# Patient Record
Sex: Female | Born: 1985 | Race: White | Hispanic: No | Marital: Married | Smoking: Former smoker
Health system: Southern US, Community
[De-identification: ages and names within clinical notes are randomized; demographics above are authoritative.]

## PROBLEM LIST (undated history)

## (undated) ENCOUNTER — Inpatient Hospital Stay: Payer: Self-pay

## (undated) ENCOUNTER — Emergency Department: Admission: EM | Discharge status: Absent without leave | Payer: No Typology Code available for payment source

## (undated) DIAGNOSIS — M5430 Sciatica, unspecified side: Secondary | ICD-10-CM

## (undated) DIAGNOSIS — F1910 Other psychoactive substance abuse, uncomplicated: Secondary | ICD-10-CM

## (undated) DIAGNOSIS — B182 Chronic viral hepatitis C: Secondary | ICD-10-CM

## (undated) DIAGNOSIS — I5021 Acute systolic (congestive) heart failure: Secondary | ICD-10-CM

## (undated) DIAGNOSIS — I428 Other cardiomyopathies: Secondary | ICD-10-CM

## (undated) DIAGNOSIS — J45909 Unspecified asthma, uncomplicated: Secondary | ICD-10-CM

## (undated) DIAGNOSIS — G43909 Migraine, unspecified, not intractable, without status migrainosus: Secondary | ICD-10-CM

## (undated) HISTORY — DX: Migraine, unspecified, not intractable, without status migrainosus: G43.909

## (undated) HISTORY — DX: Unspecified asthma, uncomplicated: J45.909

## (undated) HISTORY — PX: MANDIBLE FRACTURE SURGERY: SHX706

---

## 2008-04-26 ENCOUNTER — Emergency Department: Payer: Self-pay | Admitting: Emergency Medicine

## 2008-08-12 ENCOUNTER — Emergency Department: Payer: Self-pay | Admitting: Emergency Medicine

## 2008-08-13 ENCOUNTER — Ambulatory Visit: Payer: Self-pay | Admitting: Emergency Medicine

## 2008-12-08 ENCOUNTER — Inpatient Hospital Stay: Payer: Self-pay | Admitting: Obstetrics & Gynecology

## 2008-12-12 ENCOUNTER — Emergency Department: Payer: Self-pay | Admitting: Emergency Medicine

## 2010-12-11 ENCOUNTER — Emergency Department: Payer: Self-pay | Admitting: Internal Medicine

## 2011-07-06 ENCOUNTER — Emergency Department: Payer: Self-pay | Admitting: Emergency Medicine

## 2012-01-24 ENCOUNTER — Emergency Department: Payer: Self-pay | Admitting: *Deleted

## 2012-04-23 ENCOUNTER — Emergency Department: Payer: Self-pay | Admitting: Emergency Medicine

## 2012-06-04 ENCOUNTER — Emergency Department: Payer: Self-pay | Admitting: Emergency Medicine

## 2012-07-18 ENCOUNTER — Emergency Department: Payer: Self-pay | Admitting: Emergency Medicine

## 2012-08-03 ENCOUNTER — Emergency Department: Payer: Self-pay | Admitting: Unknown Physician Specialty

## 2012-10-25 ENCOUNTER — Emergency Department: Payer: Self-pay | Admitting: Emergency Medicine

## 2012-10-25 LAB — URINALYSIS, COMPLETE
Bilirubin,UR: NEGATIVE
Glucose,UR: NEGATIVE mg/dL (ref 0–75)
Protein: NEGATIVE
RBC,UR: 35 /HPF (ref 0–5)
Specific Gravity: 1.026 (ref 1.003–1.030)
Squamous Epithelial: 6
WBC UR: 66 /HPF (ref 0–5)

## 2012-10-25 LAB — COMPREHENSIVE METABOLIC PANEL
Albumin: 4.3 g/dL (ref 3.4–5.0)
Alkaline Phosphatase: 65 U/L (ref 50–136)
Anion Gap: 6 — ABNORMAL LOW (ref 7–16)
Bilirubin,Total: 0.3 mg/dL (ref 0.2–1.0)
Calcium, Total: 8.9 mg/dL (ref 8.5–10.1)
Chloride: 107 mmol/L (ref 98–107)
Creatinine: 0.62 mg/dL (ref 0.60–1.30)
EGFR (Non-African Amer.): >60
SGPT (ALT): 12 U/L (ref 12–78)
Sodium: 139 mmol/L (ref 136–145)

## 2012-10-25 LAB — CBC
HCT: 40.7 % (ref 35.0–47.0)
MCH: 28 pg (ref 26.0–34.0)
MCHC: 32.4 g/dL (ref 32.0–36.0)
MCV: 86 fL (ref 80–100)
Platelet: 187 10*3/uL (ref 150–440)
RBC: 4.72 10*6/uL (ref 3.80–5.20)
RDW: 13.2 % (ref 11.5–14.5)
WBC: 9.7 10*3/uL (ref 3.6–11.0)

## 2012-10-25 LAB — PREGNANCY, URINE: Pregnancy Test, Urine: NEGATIVE m[IU]/mL

## 2012-11-25 ENCOUNTER — Emergency Department: Payer: Self-pay | Admitting: Emergency Medicine

## 2012-11-25 LAB — URINALYSIS, COMPLETE
Bacteria: NONE SEEN
Glucose,UR: NEGATIVE mg/dL (ref 0–75)
RBC,UR: 1 /HPF (ref 0–5)
Specific Gravity: 1.009 (ref 1.003–1.030)
Squamous Epithelial: 8

## 2012-11-25 LAB — CBC
HCT: 42.1 % (ref 35.0–47.0)
MCH: 28 pg (ref 26.0–34.0)
MCV: 85 fL (ref 80–100)
WBC: 5.5 10*3/uL (ref 3.6–11.0)

## 2012-11-25 LAB — COMPREHENSIVE METABOLIC PANEL
Albumin: 4.4 g/dL (ref 3.4–5.0)
Alkaline Phosphatase: 47 U/L — ABNORMAL LOW (ref 50–136)
BUN: 6 mg/dL — ABNORMAL LOW (ref 7–18)
Chloride: 107 mmol/L (ref 98–107)
Co2: 28 mmol/L (ref 21–32)
Creatinine: 0.53 mg/dL — ABNORMAL LOW (ref 0.60–1.30)
Osmolality: 277 (ref 275–301)
SGPT (ALT): 17 U/L (ref 12–78)
Total Protein: 8.4 g/dL — ABNORMAL HIGH (ref 6.4–8.2)

## 2012-11-25 LAB — LIPASE, BLOOD: Lipase: 119 U/L (ref 73–393)

## 2013-03-10 ENCOUNTER — Emergency Department: Payer: Self-pay | Admitting: Internal Medicine

## 2013-03-10 LAB — COMPREHENSIVE METABOLIC PANEL
Alkaline Phosphatase: 46 U/L — ABNORMAL LOW (ref 50–136)
Anion Gap: 5 — ABNORMAL LOW (ref 7–16)
BUN: 6 mg/dL — ABNORMAL LOW (ref 7–18)
Bilirubin,Total: 0.3 mg/dL (ref 0.2–1.0)
Calcium, Total: 8.7 mg/dL (ref 8.5–10.1)
Chloride: 106 mmol/L (ref 98–107)
Co2: 28 mmol/L (ref 21–32)
Creatinine: 0.65 mg/dL (ref 0.60–1.30)
EGFR (African American): >60
Glucose: 88 mg/dL (ref 65–99)
Osmolality: 275 (ref 275–301)
SGOT(AST): 16 U/L (ref 15–37)
Sodium: 139 mmol/L (ref 136–145)
Total Protein: 7.9 g/dL (ref 6.4–8.2)

## 2013-03-10 LAB — CBC
HCT: 38.8 % (ref 35.0–47.0)
HGB: 12.9 g/dL (ref 12.0–16.0)
MCH: 28.1 pg (ref 26.0–34.0)
MCHC: 33.3 g/dL (ref 32.0–36.0)
RBC: 4.6 10*6/uL (ref 3.80–5.20)
RDW: 13.3 % (ref 11.5–14.5)
WBC: 6.1 10*3/uL (ref 3.6–11.0)

## 2013-03-10 LAB — URINALYSIS, COMPLETE
Bilirubin,UR: NEGATIVE
Blood: NEGATIVE
Ketone: NEGATIVE
Ph: 7 (ref 4.5–8.0)
Protein: NEGATIVE
Squamous Epithelial: 2

## 2013-03-10 LAB — LIPASE, BLOOD: Lipase: 105 U/L (ref 73–393)

## 2013-04-15 ENCOUNTER — Emergency Department: Payer: Self-pay | Admitting: Emergency Medicine

## 2013-04-16 ENCOUNTER — Emergency Department: Payer: Self-pay | Admitting: Emergency Medicine

## 2013-04-16 LAB — CBC
HCT: 40.3 % (ref 35.0–47.0)
HGB: 13.5 g/dL (ref 12.0–16.0)
MCH: 28.3 pg (ref 26.0–34.0)
Platelet: 177 10*3/uL (ref 150–440)
RDW: 13.3 % (ref 11.5–14.5)
WBC: 12.8 10*3/uL — ABNORMAL HIGH (ref 3.6–11.0)

## 2013-04-16 LAB — URINALYSIS, COMPLETE
Nitrite: POSITIVE
Ph: 5 (ref 4.5–8.0)
Protein: 100
RBC,UR: 26 /HPF (ref 0–5)
WBC UR: 532 /HPF (ref 0–5)

## 2013-04-16 LAB — BASIC METABOLIC PANEL
Anion Gap: 8 (ref 7–16)
Calcium, Total: 9.3 mg/dL (ref 8.5–10.1)
Chloride: 99 mmol/L (ref 98–107)
Co2: 27 mmol/L (ref 21–32)
Potassium: 3.5 mmol/L (ref 3.5–5.1)
Sodium: 134 mmol/L — ABNORMAL LOW (ref 136–145)

## 2013-04-19 ENCOUNTER — Emergency Department: Payer: Self-pay | Admitting: Emergency Medicine

## 2014-05-25 ENCOUNTER — Emergency Department: Payer: Self-pay | Admitting: Emergency Medicine

## 2014-05-25 LAB — CBC WITH DIFFERENTIAL/PLATELET
BASOS PCT: 0.7 %
Basophil #: 0.1 10*3/uL (ref 0.0–0.1)
EOS PCT: 0.6 %
Eosinophil #: 0.1 10*3/uL (ref 0.0–0.7)
HCT: 40.1 % (ref 35.0–47.0)
HGB: 13 g/dL (ref 12.0–16.0)
Lymphocyte #: 2.6 10*3/uL (ref 1.0–3.6)
Lymphocyte %: 28 %
MCH: 28.2 pg (ref 26.0–34.0)
MCHC: 32.5 g/dL (ref 32.0–36.0)
MCV: 87 fL (ref 80–100)
MONO ABS: 0.6 x10 3/mm (ref 0.2–0.9)
Monocyte %: 6.8 %
NEUTROS ABS: 6 10*3/uL (ref 1.4–6.5)
Neutrophil %: 63.9 %
Platelet: 151 10*3/uL (ref 150–440)
RBC: 4.62 10*6/uL (ref 3.80–5.20)
RDW: 13.3 % (ref 11.5–14.5)
WBC: 9.4 10*3/uL (ref 3.6–11.0)

## 2014-05-25 LAB — COMPREHENSIVE METABOLIC PANEL
Albumin: 4 g/dL (ref 3.4–5.0)
Alkaline Phosphatase: 35 U/L — ABNORMAL LOW
Anion Gap: 12 (ref 7–16)
BUN: 7 mg/dL (ref 7–18)
Bilirubin,Total: 0.5 mg/dL (ref 0.2–1.0)
CHLORIDE: 104 mmol/L (ref 98–107)
CREATININE: 0.67 mg/dL (ref 0.60–1.30)
Calcium, Total: 8.5 mg/dL (ref 8.5–10.1)
Co2: 24 mmol/L (ref 21–32)
EGFR (African American): >60
EGFR (Non-African Amer.): >60
GLUCOSE: 90 mg/dL (ref 65–99)
Osmolality: 277 (ref 275–301)
Potassium: 3.5 mmol/L (ref 3.5–5.1)
SGOT(AST): 23 U/L (ref 15–37)
SGPT (ALT): 14 U/L
SODIUM: 140 mmol/L (ref 136–145)
Total Protein: 7.5 g/dL (ref 6.4–8.2)

## 2014-05-25 LAB — URINALYSIS, COMPLETE
Bilirubin,UR: NEGATIVE
Blood: NEGATIVE
Glucose,UR: NEGATIVE mg/dL (ref 0–75)
Ketone: NEGATIVE
NITRITE: POSITIVE
PH: 6 (ref 4.5–8.0)
PROTEIN: NEGATIVE
Specific Gravity: 1.018 (ref 1.003–1.030)
Squamous Epithelial: 8
WBC UR: 32 /HPF (ref 0–5)

## 2014-05-27 LAB — URINE CULTURE

## 2014-08-23 ENCOUNTER — Emergency Department: Payer: Self-pay | Admitting: Emergency Medicine

## 2015-01-04 ENCOUNTER — Emergency Department: Payer: Self-pay | Admitting: Emergency Medicine

## 2015-09-07 ENCOUNTER — Encounter: Payer: Self-pay | Admitting: *Deleted

## 2015-09-07 ENCOUNTER — Emergency Department: Payer: Medicaid Other

## 2015-09-07 ENCOUNTER — Emergency Department
Admission: EM | Admit: 2015-09-07 | Discharge: 2015-09-07 | Disposition: A | Discharge status: Home / Self Care | Payer: Medicaid Other | Attending: Emergency Medicine | Admitting: Emergency Medicine

## 2015-09-07 DIAGNOSIS — Z3202 Encounter for pregnancy test, result negative: Secondary | ICD-10-CM | POA: Insufficient documentation

## 2015-09-07 DIAGNOSIS — Y92410 Unspecified street and highway as the place of occurrence of the external cause: Secondary | ICD-10-CM | POA: Insufficient documentation

## 2015-09-07 DIAGNOSIS — S39012A Strain of muscle, fascia and tendon of lower back, initial encounter: Secondary | ICD-10-CM | POA: Diagnosis not present

## 2015-09-07 DIAGNOSIS — S3992XA Unspecified injury of lower back, initial encounter: Secondary | ICD-10-CM | POA: Diagnosis present

## 2015-09-07 DIAGNOSIS — F17200 Nicotine dependence, unspecified, uncomplicated: Secondary | ICD-10-CM | POA: Diagnosis not present

## 2015-09-07 DIAGNOSIS — Y998 Other external cause status: Secondary | ICD-10-CM | POA: Insufficient documentation

## 2015-09-07 DIAGNOSIS — Y9389 Activity, other specified: Secondary | ICD-10-CM | POA: Diagnosis not present

## 2015-09-07 LAB — POC URINE PREG, ED: PREG TEST UR: NEGATIVE

## 2015-09-07 MED ORDER — HYDROCODONE-ACETAMINOPHEN 5-325 MG PO TABS: 1.0000 | ORAL_TABLET | ORAL | Status: DC | PRN

## 2015-09-07 MED ORDER — IBUPROFEN 600 MG PO TABS: 600.0000 mg | ORAL_TABLET | Freq: Four times a day (QID) | ORAL | Status: DC | PRN
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
## 2015-09-07 NOTE — ED Notes (Signed)
Pt ambulated to restroom without difficulty

## 2015-09-07 NOTE — Discharge Instructions (Signed)
Follow-up with [facility] if any continued problems. Ibuprofen as needed for pain. Moist heat or ice to muscles as needed for soreness and pain.
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
## 2015-10-09 NOTE — L&D Delivery Note (Addendum)
Delivery Note At 7:42 AM a viable female was delivered via  (Presentation: OA;  LOA).  APGAR: 8, 9; weight:  2948g, 6 pounds 8 ounces Placenta status: spontaneous, intact.  Cord:  with the following complications: none.  Cord pH: NA  Called to see patient who had been pushing and head was crowning with RN assisting delivery.  Mom pushed to deliver a viable female infant.  The head followed by shoulders, which delivered without difficulty, and the rest of the body.  No nuchal cord noted.  Baby to mom's chest.  Cord clamped and cut after > 1 min delay.  Cord blood obtained.  Placenta delivered spontaneously, intact, with a 3-vessel cord.  All counts correct.  Hemostasis obtained with IV pitocin and fundal massage.     Anesthesia:  none Episiotomy:  none Lacerations:  intact Suture Repair: NA Est. Blood Loss (mL): 200  Mom to postpartum.  Baby to Couplet care / Skin to Skin.  Ananya Mccleese, CNM 07/17/2016, 8:07 AM

## 2015-10-15 ENCOUNTER — Encounter: Payer: Self-pay | Admitting: Emergency Medicine

## 2015-10-15 ENCOUNTER — Emergency Department
Admission: EM | Admit: 2015-10-15 | Discharge: 2015-10-15 | Disposition: A | Discharge status: Home / Self Care | Payer: Medicaid Other | Attending: Emergency Medicine | Admitting: Emergency Medicine

## 2015-10-15 DIAGNOSIS — A5909 Other urogenital trichomoniasis: Secondary | ICD-10-CM | POA: Diagnosis not present

## 2015-10-15 DIAGNOSIS — F17200 Nicotine dependence, unspecified, uncomplicated: Secondary | ICD-10-CM | POA: Insufficient documentation

## 2015-10-15 DIAGNOSIS — B9689 Other specified bacterial agents as the cause of diseases classified elsewhere: Secondary | ICD-10-CM

## 2015-10-15 DIAGNOSIS — Z3202 Encounter for pregnancy test, result negative: Secondary | ICD-10-CM | POA: Diagnosis not present

## 2015-10-15 DIAGNOSIS — N72 Inflammatory disease of cervix uteri: Secondary | ICD-10-CM

## 2015-10-15 DIAGNOSIS — N760 Acute vaginitis: Secondary | ICD-10-CM | POA: Diagnosis not present

## 2015-10-15 DIAGNOSIS — N898 Other specified noninflammatory disorders of vagina: Secondary | ICD-10-CM | POA: Diagnosis present

## 2015-10-15 LAB — CHLAMYDIA/NGC RT PCR (ARMC ONLY)
CHLAMYDIA TR: NOT DETECTED
N gonorrhoeae: DETECTED — AB

## 2015-10-15 LAB — POCT PREGNANCY, URINE: Preg Test, Ur: NEGATIVE

## 2015-10-15 LAB — URINALYSIS COMPLETE WITH MICROSCOPIC (ARMC ONLY)
BILIRUBIN URINE: NEGATIVE
Bacteria, UA: NONE SEEN
GLUCOSE, UA: NEGATIVE mg/dL
HGB URINE DIPSTICK: NEGATIVE
KETONES UR: NEGATIVE mg/dL
NITRITE: NEGATIVE
PH: 6 (ref 5.0–8.0)
Protein, ur: 100 mg/dL — AB
Specific Gravity, Urine: 1.019 (ref 1.005–1.030)

## 2015-10-15 LAB — WET PREP, GENITAL
SPERM: NONE SEEN
Yeast Wet Prep HPF POC: NONE SEEN

## 2015-10-15 MED ORDER — LIDOCAINE HCL (PF) 1 % IJ SOLN
2.1000 mL | Freq: Once | INTRAMUSCULAR | Status: AC
  Administered 2015-10-15: 2.1 mL
  Filled 2015-10-15: qty 5

## 2015-10-15 MED ORDER — LIDOCAINE HCL (PF) 1 % IJ SOLN
INTRAMUSCULAR | Status: AC
  Administered 2015-10-15: 2.1 mL
  Filled 2015-10-15: qty 5

## 2015-10-15 MED ORDER — CEFTRIAXONE SODIUM 1 G IJ SOLR
500.0000 mg | Freq: Once | INTRAMUSCULAR | Status: AC
  Administered 2015-10-15: 500 mg via INTRAMUSCULAR
  Filled 2015-10-15: qty 10

## 2015-10-15 MED ORDER — AZITHROMYCIN 250 MG PO TABS
1000.0000 mg | ORAL_TABLET | Freq: Every day | ORAL | Status: DC
  Administered 2015-10-15: 1000 mg via ORAL
  Filled 2015-10-15: qty 4

## 2015-10-15 MED ORDER — METRONIDAZOLE 500 MG PO TABS: 500.0000 mg | ORAL_TABLET | Freq: Two times a day (BID) | ORAL | Status: DC

## 2015-10-15 MED ORDER — DOXYCYCLINE HYCLATE 100 MG PO CAPS: 100.0000 mg | ORAL_CAPSULE | Freq: Two times a day (BID) | ORAL | Status: DC

## 2015-10-15 NOTE — ED Provider Notes (Signed)
[facility] Emergency Department Provider Note ____________________________________________  Time seen: Approximately 4:58 PM  I have reviewed the triage vital signs and the nursing notes.   HISTORY  Chief Complaint Vaginal Discharge   HPI Carolyn Cox is a 30 y.o. female who presents to the emergency department for evaluation of vaginal discharge. She noticed a foul odor and discharge 4 days ago. Previous STI. Having unprotected intercourse.   History reviewed. No pertinent past medical history.  There are no active problems to display for this patient.   Past Surgical History  Procedure Laterality Date  . Mandible fracture surgery      Current Outpatient Rx  Name  Route  Sig  Dispense  Refill  . doxycycline (VIBRAMYCIN) 100 MG capsule   Oral   Take 1 capsule (100 mg total) by mouth 2 (two) times daily.   20 capsule   0   . HYDROcodone-acetaminophen (NORCO/VICODIN) 5-325 MG tablet   Oral   Take 1 tablet by mouth every 4 (four) hours as needed for moderate pain.   15 tablet   0   . ibuprofen (ADVIL,MOTRIN) 600 MG tablet   Oral   Take 1 tablet (600 mg total) by mouth every 6 (six) hours as needed.   30 tablet   0   . metroNIDAZOLE (FLAGYL) 500 MG tablet   Oral   Take 1 tablet (500 mg total) by mouth 2 (two) times daily.   14 tablet   0     Allergies Review of patient's allergies indicates no known allergies.  No family history on file.  Social History Social History  Substance Use Topics  . Smoking status: Current Some Day Smoker  . Smokeless tobacco: None  . Alcohol Use: Yes    Review of Systems Constitutional: No fever/chills Cardiovascular: Denies chest pain. Respiratory: Denies shortness of breath or cough. Gastrointestinal: Abdominal pain no., nausea no, vomitingno. Genitourinary: Dysuria no, vaginal discharge yes.. Musculoskeletal: Negative for back pain. Skin: Negative for rash. Neurological: Negative for  headaches, focal weakness or numbness.  10-point ROS otherwise negative.  ____________________________________________   PHYSICAL EXAM:  VITAL SIGNS: ED Triage Vitals  Enc Vitals Group     BP 10/15/15 1623 125/99 mmHg     Pulse Rate 10/15/15 1623 102     Resp 10/15/15 1623 20     Temp 10/15/15 1623 98.1 F (36.7 C)     Temp Source 10/15/15 1623 Oral     SpO2 10/15/15 1623 97 %     Weight 10/15/15 1623 120 lb (54.432 kg)     Height 10/15/15 1623  (1.676 m)     Head Cir --      Peak Flow --      Pain Score --      Pain Loc --      Pain Edu? --      Excl. in GC? --     Constitutional: Alert and oriented. Well appearing and in no acute distress. Eyes: Conjunctivae are normal. PERRL. EOMI. Head: Atraumatic. Nose: No congestion/rhinnorhea. Mouth/Throat: Mucous membranes are moist.  Oropharynx non-erythematous. Neck: No stridor. Cardiovascular: Good peripheral circulation. Respiratory: Normal respiratory effort.  No retractions. Gastrointestinal: Soft and nontender. No distention. No abdominal bruits. Genitourinary: Pelvic exam: Retroverted cervix. White, malodorous exudate noted on exam with cervical motion tenderness. Musculoskeletal: No extremity tenderness nor edema.  Neurologic:  Normal speech and language. No gross focal neurologic deficits are appreciated. Speech is normal. No gait instability. Skin:  Skin is warm,  dry and intact. No rash noted. Psychiatric: Mood and affect are normal. Speech and behavior are normal.  ____________________________________________   LABS (all labs ordered are listed, but only abnormal results are displayed)  Labs Reviewed  WET PREP, GENITAL - Abnormal; Notable for the following:    Trich, Wet Prep FEW (*)    Clue Cells Wet Prep HPF POC FEW (*)    WBC, Wet Prep HPF POC RARE (*)    All other components within normal limits  URINALYSIS COMPLETEWITH MICROSCOPIC ([facility] ONLY) - Abnormal; Notable for the following:    Color, Urine  AMBER (*)    APPearance CLOUDY (*)    Protein, ur 100 (*)    Leukocytes, UA 1+ (*)    Squamous Epithelial / LPF 6-30 (*)    All other components within normal limits  CHLAMYDIA/NGC RT PCR ([facility] ONLY)  POC URINE PREG, ED  POCT PREGNANCY, URINE   ____________________________________________  RADIOLOGY  Not indicated ____________________________________________   PROCEDURES  Procedure(s) performed: None  ____________________________________________   INITIAL IMPRESSION / ASSESSMENT AND PLAN / ED COURSE  Pertinent labs & imaging results that were available during my care of the patient were reviewed by me and considered in my medical decision making (see chart for details).  Patient was advised to follow-up with the [facility]. She was treated today with 1 g of azithromycin, 500 mg of Rocephin, and Flagyl. She will have a prescription for Flagyl and doxycycline to be taken as prescribed. She was advised to return to the emergency department for symptoms that change or worsen if she is unable schedule an appointment with the health department ____________________________________________   FINAL CLINICAL IMPRESSION(S) / ED DIAGNOSES  Final diagnoses:  Trichomonal cervicitis  Bacterial vaginosis  Acute cervicitis       Chinita Pester, FNP 10/15/15 1833  Sharman Cheek, MD 10/15/15 [phone]

## 2015-10-15 NOTE — ED Notes (Signed)
States has been having unprotected intercourse, concerned due to clear vaginal discharge

## 2015-10-15 NOTE — ED Notes (Signed)
Patient tolerated pelvic well.

## 2015-10-15 NOTE — Discharge Instructions (Signed)
Bacterial Vaginosis °Bacterial vaginosis is a vaginal infection that occurs when the normal balance of bacteria in the vagina is disrupted. It results from an overgrowth of certain bacteria. This is the most common vaginal infection in women of childbearing age. Treatment is important to prevent complications, especially in pregnant women, as it can cause a premature delivery. °CAUSES  °Bacterial vaginosis is caused by an increase in harmful bacteria that are normally present in smaller amounts in the vagina. Several different kinds of bacteria can cause bacterial vaginosis. However, the reason that the condition develops is not fully understood. °RISK FACTORS °Certain activities or behaviors can put you at an increased risk of developing bacterial vaginosis, including: °· Having a new sex partner or multiple sex partners. °· Douching. °· Using an intrauterine device (IUD) for contraception. °Women do not get bacterial vaginosis from toilet seats, bedding, swimming pools, or contact with objects around them. °SIGNS AND SYMPTOMS  °Some women with bacterial vaginosis have no signs or symptoms. Common symptoms include: °· Grey vaginal discharge. °· A fishlike odor with discharge, especially after sexual intercourse. °· Itching or burning of the vagina and vulva. °· Burning or pain with urination. °DIAGNOSIS  °Your health care provider will take a medical history and examine the vagina for signs of bacterial vaginosis. A sample of vaginal fluid may be taken. Your health care provider will look at this sample under a microscope to check for bacteria and abnormal cells. A vaginal pH test may also be done.  °TREATMENT  °Bacterial vaginosis may be treated with antibiotic medicines. These may be given in the form of a pill or a vaginal cream. A second round of antibiotics may be prescribed if the condition comes back after treatment. Because bacterial vaginosis increases your risk for sexually transmitted diseases, getting  treated can help reduce your risk for chlamydia, gonorrhea, HIV, and herpes. °HOME CARE INSTRUCTIONS  °· Only take over-the-counter or prescription medicines as directed by your health care provider. °· If antibiotic medicine was prescribed, take it as directed. Make sure you finish it even if you start to feel better. °· Tell all sexual partners that you have a vaginal infection. They should see their health care provider and be treated if they have problems, such as a mild rash or itching. °· During treatment, it is important that you follow these instructions: °¨ Avoid sexual activity or use condoms correctly. °¨ Do not douche. °¨ Avoid alcohol as directed by your health care provider. °¨ Avoid breastfeeding as directed by your health care provider. °SEEK MEDICAL CARE IF:  °· Your symptoms are not improving after 3 days of treatment. °· You have increased discharge or pain. °· You have a fever. °MAKE SURE YOU:  °· Understand these instructions. °· Will watch your condition. °· Will get help right away if you are not doing well or get worse. °FOR MORE INFORMATION  °Centers for Disease Control and Prevention, Division of STD Prevention: www.cdc.gov/std °American Sexual Health Association (ASHA): www.ashastd.org  °  °This information is not intended to replace advice given to you by your health care provider. Make sure you discuss any questions you have with your health care provider. °  °Document Released: 09/24/2005 Document Revised: 10/15/2014 Document Reviewed: 05/06/2013 °Elsevier Interactive Patient Education ©2016 [employer] ° °Cervicitis °Cervicitis is a soreness and swelling (inflammation) of the cervix. Your cervix is located at the bottom of your uterus. It opens up to the vagina. °CAUSES  °· Sexually transmitted infections (STIs).   °·   Allergic reaction.   °· Medicines or birth control devices that are put in the vagina.   °· Injury to the cervix.   °· Bacterial infections.   °RISK FACTORS °You are at  greater risk if you: °· Have unprotected sexual intercourse. °· Have sexual intercourse with many partners. °· Began sexual intercourse at an early age. °· Have a history of STIs. °SYMPTOMS  °There may be no symptoms. If symptoms occur, they may include:  °· Gray, white, yellow, or bad-smelling vaginal discharge.   °· Pain or itching of the area outside the vagina.   °· Painful sexual intercourse.   °· Lower abdominal or lower back pain, especially during intercourse.   °· Frequent urination.   °· Abnormal vaginal bleeding between periods, after sexual intercourse, or after menopause.   °· Pressure or a heavy feeling in the pelvis.   °DIAGNOSIS  °Diagnosis is made after a pelvic exam. Other tests may include:  °· Examination of any discharge under a microscope (wet prep).   °· A Pap test.   °TREATMENT  °Treatment will depend on the cause of cervicitis. If it is caused by an STI, both you and your partner will need to be treated. Antibiotic medicines will be given.  °HOME CARE INSTRUCTIONS  °· Do not have sexual intercourse until your health care provider says it is okay.   °· Do not have sexual intercourse until your partner has been treated, if your cervicitis is caused by an STI.   °· Take your antibiotics as directed. Finish them even if you start to feel better.   °SEEK MEDICAL CARE IF: °· Your symptoms come back.   °· You have a fever.   °MAKE SURE YOU:  °· Understand these instructions. °· Will watch your condition. °· Will get help right away if you are not doing well or get worse. °  °This information is not intended to replace advice given to you by your health care provider. Make sure you discuss any questions you have with your health care provider. °  °Document Released: 09/24/2005 Document Revised: 09/29/2013 Document Reviewed: 03/18/2013 °Elsevier Interactive Patient Education ©2016 [employer] Inc. ° °

## 2015-10-17 ENCOUNTER — Telehealth: Payer: Self-pay | Admitting: Emergency Medicine

## 2015-10-17 NOTE — ED Notes (Signed)
Called patient to inform of std results.  Left message asking her to call me. 

## 2015-11-14 ENCOUNTER — Emergency Department
Admission: EM | Admit: 2015-11-14 | Discharge: 2015-11-14 | Disposition: A | Discharge status: Home / Self Care | Payer: Medicaid Other | Attending: Student | Admitting: Student

## 2015-11-14 ENCOUNTER — Emergency Department: Payer: Medicaid Other

## 2015-11-14 ENCOUNTER — Encounter: Payer: Self-pay | Admitting: Emergency Medicine

## 2015-11-14 DIAGNOSIS — S99912A Unspecified injury of left ankle, initial encounter: Secondary | ICD-10-CM | POA: Insufficient documentation

## 2015-11-14 DIAGNOSIS — Z792 Long term (current) use of antibiotics: Secondary | ICD-10-CM | POA: Diagnosis not present

## 2015-11-14 DIAGNOSIS — F17200 Nicotine dependence, unspecified, uncomplicated: Secondary | ICD-10-CM | POA: Diagnosis not present

## 2015-11-14 DIAGNOSIS — Y998 Other external cause status: Secondary | ICD-10-CM | POA: Insufficient documentation

## 2015-11-14 DIAGNOSIS — Y9289 Other specified places as the place of occurrence of the external cause: Secondary | ICD-10-CM | POA: Diagnosis not present

## 2015-11-14 DIAGNOSIS — Z79899 Other long term (current) drug therapy: Secondary | ICD-10-CM | POA: Insufficient documentation

## 2015-11-14 DIAGNOSIS — X501XXA Overexertion from prolonged static or awkward postures, initial encounter: Secondary | ICD-10-CM | POA: Diagnosis not present

## 2015-11-14 DIAGNOSIS — M25572 Pain in left ankle and joints of left foot: Secondary | ICD-10-CM

## 2015-11-14 DIAGNOSIS — Y9389 Activity, other specified: Secondary | ICD-10-CM | POA: Diagnosis not present

## 2015-11-14 MED ORDER — IBUPROFEN 800 MG PO TABS
800.0000 mg | ORAL_TABLET | Freq: Once | ORAL | Status: AC
  Administered 2015-11-14: 800 mg via ORAL
  Filled 2015-11-14: qty 1

## 2015-11-14 MED ORDER — OXYCODONE-ACETAMINOPHEN 5-325 MG PO TABS: 1.0000 | ORAL_TABLET | Freq: Four times a day (QID) | ORAL | Status: DC | PRN

## 2015-11-14 MED ORDER — IBUPROFEN 50 MG PO CHEW: 50.0000 mg | CHEWABLE_TABLET | Freq: Three times a day (TID) | ORAL | Status: DC | PRN

## 2015-11-14 MED ORDER — OXYCODONE-ACETAMINOPHEN 5-325 MG PO TABS
1.0000 | ORAL_TABLET | Freq: Once | ORAL | Status: AC
  Administered 2015-11-14: 1 via ORAL
  Filled 2015-11-14: qty 1

## 2015-11-14 NOTE — ED Provider Notes (Signed)
[facility] Emergency Department Provider Note  ____________________________________________  Time seen: Approximately 11:43 PM  I have reviewed the triage vital signs and the nursing notes.   HISTORY  Chief Complaint Ankle Pain    HPI Carolyn Cox is a 30 y.o. female patient complain of left lateral ankle pain secondary to a twisting incident. She states she was on uneven ground and she rolled the ankle. Patient state unable to weight-bear secondary to the pain. No palliative measures till around the ER which is given ice pack. She rates the pain as a 10 over 10.   History reviewed. No pertinent past medical history.  There are no active problems to display for this patient.   Past Surgical History  Procedure Laterality Date  . Mandible fracture surgery      Current Outpatient Rx  Name  Route  Sig  Dispense  Refill  . doxycycline (VIBRAMYCIN) 100 MG capsule   Oral   Take 1 capsule (100 mg total) by mouth 2 (two) times daily.   20 capsule   0   . HYDROcodone-acetaminophen (NORCO/VICODIN) 5-325 MG tablet   Oral   Take 1 tablet by mouth every 4 (four) hours as needed for moderate pain.   15 tablet   0   . ibuprofen (ADVIL,MOTRIN) 50 MG chewable tablet   Oral   Chew 1 tablet (50 mg total) by mouth every 8 (eight) hours as needed for fever.   24 tablet   2   . ibuprofen (ADVIL,MOTRIN) 600 MG tablet   Oral   Take 1 tablet (600 mg total) by mouth every 6 (six) hours as needed.   30 tablet   0   . metroNIDAZOLE (FLAGYL) 500 MG tablet   Oral   Take 1 tablet (500 mg total) by mouth 2 (two) times daily.   14 tablet   0   . oxyCODONE-acetaminophen (ROXICET) 5-325 MG tablet   Oral   Take 1 tablet by mouth every 6 (six) hours as needed for moderate pain.   12 tablet   0     Allergies Review of patient's allergies indicates no known allergies.  No family history on file.  Social History Social History  Substance Use Topics  .  Smoking status: Current Some Day Smoker  . Smokeless tobacco: None  . Alcohol Use: Yes    Review of Systems Constitutional: No fever/chills Eyes: No visual changes. ENT: No sore throat. Cardiovascular: Denies chest pain. Respiratory: Denies shortness of breath. Gastrointestinal: No abdominal pain.  No nausea, no vomiting.  No diarrhea.  No constipation. Genitourinary: Negative for dysuria. Musculoskeletal: Left ankle pain  Skin: Negative for rash. Neurological: Negative for headaches, focal weakness or numbness.    ____________________________________________   PHYSICAL EXAM:  VITAL SIGNS: ED Triage Vitals  Enc Vitals Group     BP 11/14/15 2247 127/83 mmHg     Pulse Rate 11/14/15 2247 87     Resp 11/14/15 2247 20     Temp 11/14/15 2247 98 F (36.7 C)     Temp Source 11/14/15 2247 Oral     SpO2 11/14/15 2247 100 %     Weight 11/14/15 2247 120 lb (54.432 kg)     Height 11/14/15 2247 5\' 6"  (1.676 m)     Head Cir --      Peak Flow --      Pain Score 11/14/15 2247 10     Pain Loc --      Pain Edu? --  Excl. in GC? --     Constitutional: Alert and oriented. Well appearing and in no acute distress. Eyes: Conjunctivae are normal. PERRL. EOMI. Head: Atraumatic. Nose: No congestion/rhinnorhea. Mouth/Throat: Mucous membranes are moist.  Oropharynx non-erythematous. Neck: No stridor. No cervical spine tenderness to palpation. Hematological/Lymphatic/Immunilogical: No cervical lymphadenopathy. Cardiovascular: Normal rate, regular rhythm. Grossly normal heart sounds.  Good peripheral circulation. Respiratory: Normal respiratory effort.  No retractions. Lungs CTAB. Gastrointestinal: Soft and nontender. No distention. No abdominal bruits. No CVA tenderness. Musculoskeletal: No obvious deformity to the left ankle. Moderate edema to the lateral inferior malleolus. Patient unable to weight-bear secondary to pain. Neurologic:  Normal speech and language. No gross focal  neurologic deficits are appreciated. No gait instability. Skin:  Skin is warm, dry and intact. No rash noted. Psychiatric: Mood and affect are normal. Speech and behavior are normal.  ____________________________________________   LABS (all labs ordered are listed, but only abnormal results are displayed)  Labs Reviewed - No data to display ____________________________________________  EKG   ____________________________________________  RADIOLOGY  No acute findings on x-ray. ____________________________________________   PROCEDURES  Procedure(s) performed: None  Critical Care performed: No  ____________________________________________   INITIAL IMPRESSION / ASSESSMENT AND PLAN / ED COURSE  Pertinent labs & imaging results that were available during my care of the patient were reviewed by me and considered in my medical decision making (see chart for details).  Left ankle sprain. Discussed negative x-ray findings with patient. Patient placed in an ankle splint and given crutches to ambulate for 3-5 days as needed. Patient given a prescription for Percocet and ibuprofen. ____________________________________________   FINAL CLINICAL IMPRESSION(S) / ED DIAGNOSES  Final diagnoses:  Left lateral ankle pain      Joni Reining, PA-C 11/14/15 2350  Gayla Doss, MD 11/15/15 [phone]

## 2015-11-14 NOTE — ED Notes (Signed)
Patient states that she tripped on some uneven ground and rolled her left ankle, patient states that all of her weight came down on her ankle. Patient applied ice to the area. Patient states that she is unable to bear weight on the ankle.

## 2015-11-14 NOTE — ED Notes (Signed)
Pt to triage via w/c with no distress noted; reports falling hr PTA injuring left ankle; denies any other c/o or injuries

## 2015-12-02 ENCOUNTER — Emergency Department
Admission: EM | Admit: 2015-12-02 | Discharge: 2015-12-02 | Disposition: A | Discharge status: Home / Self Care | Payer: Medicaid Other | Attending: Emergency Medicine | Admitting: Emergency Medicine

## 2015-12-02 ENCOUNTER — Encounter: Payer: Self-pay | Admitting: Emergency Medicine

## 2015-12-02 DIAGNOSIS — O99330 Smoking (tobacco) complicating pregnancy, unspecified trimester: Secondary | ICD-10-CM | POA: Insufficient documentation

## 2015-12-02 DIAGNOSIS — Z792 Long term (current) use of antibiotics: Secondary | ICD-10-CM | POA: Diagnosis not present

## 2015-12-02 DIAGNOSIS — F17200 Nicotine dependence, unspecified, uncomplicated: Secondary | ICD-10-CM | POA: Insufficient documentation

## 2015-12-02 DIAGNOSIS — Z3490 Encounter for supervision of normal pregnancy, unspecified, unspecified trimester: Secondary | ICD-10-CM

## 2015-12-02 DIAGNOSIS — O210 Mild hyperemesis gravidarum: Secondary | ICD-10-CM | POA: Diagnosis present

## 2015-12-02 DIAGNOSIS — Z3A00 Weeks of gestation of pregnancy not specified: Secondary | ICD-10-CM | POA: Insufficient documentation

## 2015-12-02 DIAGNOSIS — Z79899 Other long term (current) drug therapy: Secondary | ICD-10-CM | POA: Diagnosis not present

## 2015-12-02 LAB — POCT PREGNANCY, URINE: Preg Test, Ur: POSITIVE — AB

## 2015-12-02 NOTE — ED Provider Notes (Signed)
[facility] Emergency Department Provider Note  ____________________________________________  Time seen: Approximately 7:45 PM  I have reviewed the triage vital signs and the nursing notes.   HISTORY  Chief Complaint Possible Pregnancy    HPI Carolyn Cox is a 30 y.o. female who presents with nausea and vomiting for 3-4 days. She took a pregnancy test last night that was positive, and wanted to come to the ER to verify the results. She is able to take some liquids. No abdominal pain, vaginal discharge. She has one 52-year-old child at home.   History reviewed. No pertinent past medical history.  There are no active problems to display for this patient.   Past Surgical History  Procedure Laterality Date  . Mandible fracture surgery      Current Outpatient Rx  Name  Route  Sig  Dispense  Refill  . doxycycline (VIBRAMYCIN) 100 MG capsule   Oral   Take 1 capsule (100 mg total) by mouth 2 (two) times daily.   20 capsule   0   . HYDROcodone-acetaminophen (NORCO/VICODIN) 5-325 MG tablet   Oral   Take 1 tablet by mouth every 4 (four) hours as needed for moderate pain.   15 tablet   0   . ibuprofen (ADVIL,MOTRIN) 50 MG chewable tablet   Oral   Chew 1 tablet (50 mg total) by mouth every 8 (eight) hours as needed for fever.   24 tablet   2   . ibuprofen (ADVIL,MOTRIN) 600 MG tablet   Oral   Take 1 tablet (600 mg total) by mouth every 6 (six) hours as needed.   30 tablet   0   . metroNIDAZOLE (FLAGYL) 500 MG tablet   Oral   Take 1 tablet (500 mg total) by mouth 2 (two) times daily.   14 tablet   0   . oxyCODONE-acetaminophen (ROXICET) 5-325 MG tablet   Oral   Take 1 tablet by mouth every 6 (six) hours as needed for moderate pain.   12 tablet   0     Allergies Review of patient's allergies indicates no known allergies.  No family history on file.  Social History Social History  Substance Use Topics  . Smoking status: Current Some  Day Smoker  . Smokeless tobacco: None  . Alcohol Use: Yes    Review of Systems Constitutional: No fever/chills Eyes: No visual changes. ENT: No sore throat. Cardiovascular: Denies chest pain. Respiratory: Denies shortness of breath. Gastrointestinal: No abdominal pain.No diarrhea.  No constipation. Genitourinary: Negative for dysuria. Musculoskeletal: Negative for back pain. Skin: Negative for rash. Neurological: Negative for headaches, focal weakness or numbness. 10-point ROS otherwise negative.  ____________________________________________   PHYSICAL EXAM:  VITAL SIGNS: ED Triage Vitals  Enc Vitals Group     BP 12/02/15 1858 126/76 mmHg     Pulse Rate 12/02/15 1856 87     Resp 12/02/15 1856 16     Temp 12/02/15 1856 98.6 F (37 C)     Temp Source 12/02/15 1856 Oral     SpO2 12/02/15 1856 100 %     Weight 12/02/15 1856 126 lb (57.153 kg)     Height 12/02/15 1856 5\' 6"  (1.676 m)     Head Cir --      Peak Flow --      Pain Score 12/02/15 1857 0     Pain Loc --      Pain Edu? --      Excl. in GC? --  Constitutional: Alert and oriented. Well appearing and in no acute distress. Eyes: Conjunctivae are normal.  Respiratory: Normal respiratory effort.  \ Neurologic:  Normal speech and language. No gross focal neurologic deficits are appreciated. No gait instability. Skin:  Skin is warm, dry and intact. No rash noted. Psychiatric: Mood and affect are normal. Speech and behavior are normal.  ____________________________________________   LABS (all labs ordered are listed, but only abnormal results are displayed)  Labs Reviewed  POCT PREGNANCY, URINE - Abnormal; Notable for the following:    Preg Test, Ur POSITIVE (*)    All other components within normal limits  POC URINE PREG, ED    ____________________________________________  EKG    ____________________________________________  RADIOLOGY    ____________________________________________   PROCEDURES  Procedure(s) performed: None  Critical Care performed: No  ____________________________________________   INITIAL IMPRESSION / ASSESSMENT AND PLAN / ED COURSE  Pertinent labs & imaging results that were available during my care of the patient were reviewed by me and considered in my medical decision making (see chart for details).  30 year old female who wants to verify her pregnancy. She had a positive test at home, and confirmed here in the emergency room. She has mild nausea, but able to take by mouth fluids. No abdominal pain, vaginal discharge or bleeding. Encouraged contacting her OB/GYN for her prenatal visit. ____________________________________________   FINAL CLINICAL IMPRESSION(S) / ED DIAGNOSES  Final diagnoses:  Pregnant      Ignacia Bayley, PA-C 12/02/15 1948  Minna Antis, MD 12/02/15 2337

## 2015-12-02 NOTE — Discharge Instructions (Signed)
First Trimester of Pregnancy  The first trimester of pregnancy is from week 1 until the end of week 12 (months 1 through 3). A week after a sperm fertilizes an egg, the egg will implant on the wall of the uterus. This embryo will begin to develop into a baby. Genes from you and your partner are forming the baby. The female genes determine whether the baby is a boy or a girl. At 6-8 weeks, the eyes and face are formed, and the heartbeat can be seen on ultrasound. At the end of 12 weeks, all the baby's organs are formed.   Now that you are pregnant, you will want to do everything you can to have a healthy baby. Two of the most important things are to get good prenatal care and to follow your health care provider's instructions. Prenatal care is all the medical care you receive before the baby's birth. This care will help prevent, find, and treat any problems during the pregnancy and childbirth.  BODY CHANGES  Your body goes through many changes during pregnancy. The changes vary from woman to woman.   · You may gain or lose a couple of pounds at first.  · You may feel sick to your stomach (nauseous) and throw up (vomit). If the vomiting is uncontrollable, call your health care provider.  · You may tire easily.  · You may develop headaches that can be relieved by medicines approved by your health care provider.  · You may urinate more often. Painful urination may mean you have a bladder infection.  · You may develop heartburn as a result of your pregnancy.  · You may develop constipation because certain hormones are causing the muscles that push waste through your intestines to slow down.  · You may develop hemorrhoids or swollen, bulging veins (varicose veins).  · Your breasts may begin to grow larger and become tender. Your nipples may stick out more, and the tissue that surrounds them (areola) may become darker.  · Your gums may bleed and may be sensitive to brushing and flossing.   · Dark spots or blotches (chloasma, mask of pregnancy) may develop on your face. This will likely fade after the baby is born.  · Your menstrual periods will stop.  · You may have a loss of appetite.  · You may develop cravings for certain kinds of food.  · You may have changes in your emotions from day to day, such as being excited to be pregnant or being concerned that something may go wrong with the pregnancy and baby.  · You may have more vivid and strange dreams.  · You may have changes in your hair. These can include thickening of your hair, rapid growth, and changes in texture. Some women also have hair loss during or after pregnancy, or hair that feels dry or thin. Your hair will most likely return to normal after your baby is born.  WHAT TO EXPECT AT YOUR PRENATAL VISITS  During a routine prenatal visit:  · You will be weighed to make sure you and the baby are growing normally.  · Your blood pressure will be taken.  · Your abdomen will be measured to track your baby's growth.  · The fetal heartbeat will be listened to starting around week 10 or 12 of your pregnancy.  · Test results from any previous visits will be discussed.  Your health care provider may ask you:  · How you are feeling.  · If you   including cigarettes, chewing tobacco, and electronic cigarettes. °· If you have any questions. °Other tests that may be performed during your first trimester include: °· Blood tests to find your blood type and to check for the presence of any previous infections. They will also be used to check for low iron levels (anemia) and Rh antibodies. Later in the pregnancy, blood tests for diabetes will be done along with other tests if problems develop. °· Urine tests to check for infections, diabetes, or protein in the urine. °· An ultrasound to confirm the proper growth  and development of the baby. °· An amniocentesis to check for possible genetic problems. °· Fetal screens for spina bifida and Down syndrome. °· You may need other tests to make sure you and the baby are doing well. °· HIV (human immunodeficiency virus) testing. Routine prenatal testing includes screening for HIV, unless you choose not to have this test. °HOME CARE INSTRUCTIONS  °Medicines °· Follow your health care provider's instructions regarding medicine use. Specific medicines may be either safe or unsafe to take during pregnancy. °· Take your prenatal vitamins as directed. °· If you develop constipation, try taking a stool softener if your health care provider approves. °Diet °· Eat regular, well-balanced meals. Choose a variety of foods, such as meat or vegetable-based protein, fish, milk and low-fat dairy products, vegetables, fruits, and whole grain breads and cereals. Your health care provider will help you determine the amount of weight gain that is right for you. °· Avoid raw meat and uncooked cheese. These carry germs that can cause birth defects in the baby. °· Eating four or five small meals rather than three large meals a day may help relieve nausea and vomiting. If you start to feel nauseous, eating a few soda crackers can be helpful. Drinking liquids between meals instead of during meals also seems to help nausea and vomiting. °· If you develop constipation, eat more high-fiber foods, such as fresh vegetables or fruit and whole grains. Drink enough fluids to keep your urine clear or pale yellow. °Activity and Exercise °· Exercise only as directed by your health care provider. Exercising will help you: °¨ Control your weight. °¨ Stay in shape. °¨ Be prepared for labor and delivery. °· Experiencing pain or cramping in the lower abdomen or low back is a good sign that you should stop exercising. Check with your health care provider before continuing normal exercises. °· Try to avoid standing for long  periods of time. Move your legs often if you must stand in one place for a long time. °· Avoid heavy lifting. °· Wear low-heeled shoes, and practice good posture. °· You may continue to have sex unless your health care provider directs you otherwise. °Relief of Pain or Discomfort °· Wear a good support bra for breast tenderness.   °· Take warm sitz baths to soothe any pain or discomfort caused by hemorrhoids. Use hemorrhoid cream if your health care provider approves.   °· Rest with your legs elevated if you have leg cramps or low back pain. °· If you develop varicose veins in your legs, wear support hose. Elevate your feet for 15 minutes, 3-4 times a day. Limit salt in your diet. °Prenatal Care °· Schedule your prenatal visits by the twelfth week of pregnancy. They are usually scheduled monthly at first, then more often in the last 2 months before delivery. °· Write down your questions. Take them to your prenatal visits. °· Keep all your prenatal visits as directed by your   health care provider. Safety  Wear your seat belt at all times when driving.  Make a list of emergency phone numbers, including numbers for family, friends, the hospital, and police and fire departments. General Tips  Ask your health care provider for a referral to a local prenatal education class. Begin classes no later than at the beginning of month 6 of your pregnancy.  Ask for help if you have counseling or nutritional needs during pregnancy. Your health care provider can offer advice or refer you to specialists for help with various needs.  Do not use hot tubs, steam rooms, or saunas.  Do not douche or use tampons or scented sanitary pads.  Do not cross your legs for long periods of time.  Avoid cat litter boxes and soil used by cats. These carry germs that can cause birth defects in the baby and possibly loss of the fetus by miscarriage or stillbirth.  Avoid all smoking, herbs, alcohol, and medicines not prescribed by  your health care provider. Chemicals in these affect the formation and growth of the baby.  Do not use any tobacco products, including cigarettes, chewing tobacco, and electronic cigarettes. If you need help quitting, ask your health care provider. You may receive counseling support and other resources to help you quit.  Schedule a dentist appointment. At home, brush your teeth with a soft toothbrush and be gentle when you floss. SEEK MEDICAL CARE IF:   You have dizziness.  You have mild pelvic cramps, pelvic pressure, or nagging pain in the abdominal area.  You have persistent nausea, vomiting, or diarrhea.  You have a bad smelling vaginal discharge.  You have pain with urination.  You notice increased swelling in your face, hands, legs, or ankles. SEEK IMMEDIATE MEDICAL CARE IF:   You have a fever.  You are leaking fluid from your vagina.  You have spotting or bleeding from your vagina.  You have severe abdominal cramping or pain.  You have rapid weight gain or loss.  You vomit blood or material that looks like coffee grounds.  You are exposed to [place] measles and have never had them.  You are exposed to fifth disease or chickenpox.  You develop a severe headache.  You have shortness of breath.  You have any kind of trauma, such as from a fall or a car accident.   This information is not intended to replace advice given to you by your health care provider. Make sure you discuss any questions you have with your health care provider.   Document Released: 09/18/2001 Document Revised: 10/15/2014 Document Reviewed: 08/04/2013 Elsevier Interactive Patient Education 2016 [employer].   Contact your OB/GYN to set up a prenatal visit.

## 2015-12-02 NOTE — ED Notes (Signed)
States has been vomiting x 3-4 days.  States took a pregnancy test last night that was positive and is here today to confirm pregnancy.  Denies abdominal pain, vaginal discharge, no vaginal bleeding.

## 2015-12-02 NOTE — ED Notes (Signed)
Pt took pregnancy test with positive result at home last night. Per pt she "wanted to make sure" that she was pregnant.

## 2015-12-07 ENCOUNTER — Encounter: Payer: Self-pay | Admitting: Emergency Medicine

## 2015-12-07 ENCOUNTER — Emergency Department
Admission: EM | Admit: 2015-12-07 | Discharge: 2015-12-07 | Disposition: A | Discharge status: Home / Self Care | Payer: Medicaid Other | Attending: Emergency Medicine | Admitting: Emergency Medicine

## 2015-12-07 DIAGNOSIS — Z79899 Other long term (current) drug therapy: Secondary | ICD-10-CM | POA: Insufficient documentation

## 2015-12-07 DIAGNOSIS — Y998 Other external cause status: Secondary | ICD-10-CM | POA: Insufficient documentation

## 2015-12-07 DIAGNOSIS — Y9289 Other specified places as the place of occurrence of the external cause: Secondary | ICD-10-CM | POA: Insufficient documentation

## 2015-12-07 DIAGNOSIS — Z792 Long term (current) use of antibiotics: Secondary | ICD-10-CM | POA: Insufficient documentation

## 2015-12-07 DIAGNOSIS — W1839XA Other fall on same level, initial encounter: Secondary | ICD-10-CM | POA: Diagnosis not present

## 2015-12-07 DIAGNOSIS — S99912A Unspecified injury of left ankle, initial encounter: Secondary | ICD-10-CM | POA: Diagnosis present

## 2015-12-07 DIAGNOSIS — F17200 Nicotine dependence, unspecified, uncomplicated: Secondary | ICD-10-CM | POA: Insufficient documentation

## 2015-12-07 DIAGNOSIS — S93402A Sprain of unspecified ligament of left ankle, initial encounter: Secondary | ICD-10-CM | POA: Diagnosis not present

## 2015-12-07 DIAGNOSIS — Y9301 Activity, walking, marching and hiking: Secondary | ICD-10-CM | POA: Diagnosis not present

## 2015-12-07 MED ORDER — MELOXICAM 15 MG PO TABS: 15.0000 mg | ORAL_TABLET | Freq: Every day | ORAL | Status: DC

## 2015-12-07 MED ORDER — OXYCODONE-ACETAMINOPHEN 5-325 MG PO TABS: 1.0000 | ORAL_TABLET | Freq: Four times a day (QID) | ORAL | Status: DC | PRN

## 2015-12-07 NOTE — ED Provider Notes (Signed)
[facility] Emergency Department Provider Note  ____________________________________________  Time seen: Approximately 4:03 PM  I have reviewed the triage vital signs and the nursing notes.   HISTORY  Chief Complaint Ankle Pain    HPI Carolyn Cox is a 30 y.o. female who presents emergency department complaining of left ankle pain. Patient states that she was seen previously and diagnosed with high ankle sprain. Patient was splinted, given crutches, and pain medications. Patient states that she has Medicaid and must follow up with her primary care provider. Worsening orthopedics. She is continuing to have pain. Patient states that her child will not allow her to use crutches in a splint and is looking for medications to help alleviate pain so she continued working. Patient denies any pain to the knee or distal foot.   History reviewed. No pertinent past medical history.  There are no active problems to display for this patient.   Past Surgical History  Procedure Laterality Date  . Mandible fracture surgery      Current Outpatient Rx  Name  Route  Sig  Dispense  Refill  . doxycycline (VIBRAMYCIN) 100 MG capsule   Oral   Take 1 capsule (100 mg total) by mouth 2 (two) times daily.   20 capsule   0   . HYDROcodone-acetaminophen (NORCO/VICODIN) 5-325 MG tablet   Oral   Take 1 tablet by mouth every 4 (four) hours as needed for moderate pain.   15 tablet   0   . ibuprofen (ADVIL,MOTRIN) 50 MG chewable tablet   Oral   Chew 1 tablet (50 mg total) by mouth every 8 (eight) hours as needed for fever.   24 tablet   2   . ibuprofen (ADVIL,MOTRIN) 600 MG tablet   Oral   Take 1 tablet (600 mg total) by mouth every 6 (six) hours as needed.   30 tablet   0   . meloxicam (MOBIC) 15 MG tablet   Oral   Take 1 tablet (15 mg total) by mouth daily.   30 tablet   0   . metroNIDAZOLE (FLAGYL) 500 MG tablet   Oral   Take 1 tablet (500 mg total) by mouth 2  (two) times daily.   14 tablet   0   . oxyCODONE-acetaminophen (ROXICET) 5-325 MG tablet   Oral   Take 1 tablet by mouth every 6 (six) hours as needed for severe pain.   10 tablet   0     Allergies Review of patient's allergies indicates no known allergies.  No family history on file.  Social History Social History  Substance Use Topics  . Smoking status: Current Some Day Smoker  . Smokeless tobacco: None  . Alcohol Use: Yes     Review of Systems  Constitutional: No fever/chills Musculoskeletal: Negative for back pain. Positive for left ankle sprain. Skin: Negative for rash. Neurological: Negative for headaches, focal weakness or numbness. 10-point ROS otherwise negative.  ____________________________________________   PHYSICAL EXAM:  VITAL SIGNS: ED Triage Vitals  Enc Vitals Group     BP 12/07/15 1538 128/80 mmHg     Pulse Rate 12/07/15 1538 91     Resp 12/07/15 1538 20     Temp 12/07/15 1538 99.1 F (37.3 C)     Temp Source 12/07/15 1538 Oral     SpO2 12/07/15 1538 100 %     Weight 12/07/15 1538 126 lb (57.153 kg)     Height 12/07/15 1538 5' 6.5" (1.689 m)  Head Cir --      Peak Flow --      Pain Score 12/07/15 1537 4     Pain Loc --      Pain Edu? --      Excl. in GC? --      Constitutional: Alert and oriented. Well appearing and in no acute distress. Musculoskeletal: No visible deformity to left ankle compared to the right. No edema. No abrasions, contusions, ecchymosis, lacerations noted. Full range of motion ankle. Patient is diffusely tender to palpation over the proximal lateral malleolus. No abnormality. Dorsalis pedis pulses appreciated. Sensation intact 5 digits. Neurologic:  Normal speech and language. No gross focal neurologic deficits are appreciated.  Skin:  Skin is warm, dry and intact. No rash noted. Psychiatric: Mood and affect are normal. Speech and behavior are normal. Patient exhibits appropriate insight and  judgement.   ____________________________________________   LABS (all labs ordered are listed, but only abnormal results are displayed)  Labs Reviewed - No data to display ____________________________________________  EKG   ____________________________________________  RADIOLOGY   No results found.  ____________________________________________    PROCEDURES  Procedure(s) performed:       Medications - No data to display   ____________________________________________   INITIAL IMPRESSION / ASSESSMENT AND PLAN / ED COURSE  Pertinent labs & imaging results that were available during my care of the patient were reviewed by me and considered in my medical decision making (see chart for details).  Patient's diagnosis is consistent with a sprain. Patient is encouraged to use a neoprene ankle sleeve for symptom control.. Patient will be discharged home with prescriptions for anti-inflammatories and very limited pain medication. Patient is to follow up with primary care provider if symptoms persist past this treatment course. Patient is given ED precautions to return to the ED for any worsening or new symptoms.     ____________________________________________  FINAL CLINICAL IMPRESSION(S) / ED DIAGNOSES  Final diagnoses:  Ankle sprain, left, initial encounter      NEW MEDICATIONS STARTED DURING THIS VISIT:  New Prescriptions   MELOXICAM (MOBIC) 15 MG TABLET    Take 1 tablet (15 mg total) by mouth daily.   OXYCODONE-ACETAMINOPHEN (ROXICET) 5-325 MG TABLET    Take 1 tablet by mouth every 6 (six) hours as needed for severe pain.        Delorise Royals Cuthriell, PA-C 12/07/15 1608  Sharyn Creamer, MD 12/07/15 1721

## 2015-12-07 NOTE — Discharge Instructions (Signed)
Ankle Sprain  An ankle sprain is an injury to the strong, fibrous tissues (ligaments) that hold the bones of your ankle joint together.   CAUSES  An ankle sprain is usually caused by a fall or by twisting your ankle. Ankle sprains most commonly occur when you step on the outer edge of your foot, and your ankle turns inward. People who participate in sports are more prone to these types of injuries.   SYMPTOMS    Pain in your ankle. The pain may be present at rest or only when you are trying to stand or walk.   Swelling.   Bruising. Bruising may develop immediately or within 1 to 2 days after your injury.   Difficulty standing or walking, particularly when turning corners or changing directions.  DIAGNOSIS   Your caregiver will ask you details about your injury and perform a physical exam of your ankle to determine if you have an ankle sprain. During the physical exam, your caregiver will press on and apply pressure to specific areas of your foot and ankle. Your caregiver will try to move your ankle in certain ways. An X-ray exam may be done to be sure a bone was not broken or a ligament did not separate from one of the bones in your ankle (avulsion fracture).   TREATMENT   Certain types of braces can help stabilize your ankle. Your caregiver can make a recommendation for this. Your caregiver may recommend the use of medicine for pain. If your sprain is severe, your caregiver may refer you to a surgeon who helps to restore function to parts of your skeletal system (orthopedist) or a physical therapist.  HOME CARE INSTRUCTIONS    Apply ice to your injury for 1-2 days or as directed by your caregiver. Applying ice helps to reduce inflammation and pain.    Put ice in a plastic bag.    Place a towel between your skin and the bag.    Leave the ice on for 15-20 minutes at a time, every 2 hours while you are awake.   Only take over-the-counter or prescription medicines for pain, discomfort, or fever as directed by  your caregiver.   Elevate your injured ankle above the level of your heart as much as possible for 2-3 days.   If your caregiver recommends crutches, use them as instructed. Gradually put weight on the affected ankle. Continue to use crutches or a cane until you can walk without feeling pain in your ankle.   If you have a plaster splint, wear the splint as directed by your caregiver. Do not rest it on anything harder than a pillow for the first 24 hours. Do not put weight on it. Do not get it wet. You may take it off to take a shower or bath.   You may have been given an elastic bandage to wear around your ankle to provide support. If the elastic bandage is too tight (you have numbness or tingling in your foot or your foot becomes cold and blue), adjust the bandage to make it comfortable.   If you have an air splint, you may blow more air into it or let air out to make it more comfortable. You may take your splint off at night and before taking a shower or bath. Wiggle your toes in the splint several times per day to decrease swelling.  SEEK MEDICAL CARE IF:    You have rapidly increasing bruising or swelling.   Your toes feel   extremely cold or you lose feeling in your foot.   Your pain is not relieved with medicine.  SEEK IMMEDIATE MEDICAL CARE IF:   Your toes are numb or blue.   You have severe pain that is increasing.  MAKE SURE YOU:    Understand these instructions.   Will watch your condition.   Will get help right away if you are not doing well or get worse.     This information is not intended to replace advice given to you by your health care provider. Make sure you discuss any questions you have with your health care provider.     Document Released: 09/24/2005 Document Revised: 10/15/2014 Document Reviewed: 10/06/2011  Elsevier Interactive Patient Education 2016 [employer]

## 2015-12-07 NOTE — ED Notes (Addendum)
States she was walking on uneven ground and fell   Having pain and swelling to left ankle   Has been seen,splinted and has crutches but is not using then

## 2016-01-05 ENCOUNTER — Emergency Department
Admission: EM | Admit: 2016-01-05 | Discharge: 2016-01-05 | Disposition: A | Discharge status: Home / Self Care | Payer: Medicaid Other | Attending: Emergency Medicine | Admitting: Emergency Medicine

## 2016-01-05 ENCOUNTER — Encounter: Payer: Self-pay | Admitting: Emergency Medicine

## 2016-01-05 DIAGNOSIS — Z87891 Personal history of nicotine dependence: Secondary | ICD-10-CM | POA: Insufficient documentation

## 2016-01-05 DIAGNOSIS — Z791 Long term (current) use of non-steroidal anti-inflammatories (NSAID): Secondary | ICD-10-CM | POA: Diagnosis not present

## 2016-01-05 DIAGNOSIS — Z3A01 Less than 8 weeks gestation of pregnancy: Secondary | ICD-10-CM | POA: Insufficient documentation

## 2016-01-05 DIAGNOSIS — O210 Mild hyperemesis gravidarum: Secondary | ICD-10-CM | POA: Diagnosis present

## 2016-01-05 LAB — POCT PREGNANCY, URINE: PREG TEST UR: POSITIVE — AB

## 2016-01-05 MED ORDER — PRENATAL VITAMINS PLUS 27-1 MG PO TABS: 1.0000 | ORAL_TABLET | Freq: Every morning | ORAL | Status: DC

## 2016-01-05 MED ORDER — PROMETHAZINE HCL 25 MG PO TABS
25.0000 mg | ORAL_TABLET | Freq: Once | ORAL | Status: AC
  Administered 2016-01-05: 25 mg via ORAL
  Filled 2016-01-05: qty 1

## 2016-01-05 MED ORDER — PROMETHAZINE HCL 25 MG PO TABS: 25.0000 mg | ORAL_TABLET | Freq: Four times a day (QID) | ORAL | Status: DC | PRN

## 2016-01-05 NOTE — Discharge Instructions (Signed)
Morning Sickness °Morning sickness is when you feel sick to your stomach (nauseous) during pregnancy. You may feel sick to your stomach and throw up (vomit). You may feel sick in the morning, but you can feel this way any time of day. Some women feel very sick to their stomach and cannot stop throwing up (hyperemesis gravidarum). °HOME CARE °· Only take medicines as told by your doctor. °· Take multivitamins as told by your doctor. Taking multivitamins before getting pregnant can stop or lessen the harshness of morning sickness. °· Eat dry toast or unsalted crackers before getting out of bed. °· Eat 5 to 6 small meals a day. °· Eat dry and bland foods like rice and baked potatoes. °· Do not drink liquids with meals. Drink between meals. °· Do not eat greasy, fatty, or spicy foods. °· Have someone cook for you if the smell of food causes you to feel sick or throw up. °· If you feel sick to your stomach after taking prenatal vitamins, take them at night or with a snack. °· Eat protein when you need a snack (nuts, yogurt, cheese). °· Eat unsweetened gelatins for dessert. °· Wear a bracelet used for sea sickness (acupressure wristband). °· Go to a doctor that puts thin needles into certain body points (acupuncture) to improve how you feel. °· Do not smoke. °· Use a humidifier to keep the air in your house free of odors. °· Get lots of fresh air. °GET HELP IF: °· You need medicine to feel better. °· You feel dizzy or lightheaded. °· You are losing weight. °GET HELP RIGHT AWAY IF:  °· You feel very sick to your stomach and cannot stop throwing up. °· You pass out (faint). °MAKE SURE YOU: °· Understand these instructions. °· Will watch your condition. °· Will get help right away if you are not doing well or get worse. °  °This information is not intended to replace advice given to you by your health care provider. Make sure you discuss any questions you have with your health care provider. °  °Document Released: 11/01/2004  Document Revised: 10/15/2014 Document Reviewed: 03/11/2013 °Elsevier Interactive Patient Education ©2016 [employer] °Eating Plan for Hyperemesis Gravidarum °Severe cases of hyperemesis gravidarum can lead to dehydration and malnutrition. The hyperemesis eating plan is one way to lessen the symptoms of nausea and vomiting. It is often used with prescribed medicines to control your symptoms.  °WHAT CAN I DO TO RELIEVE MY SYMPTOMS? °Listen to your body. Everyone is different and has different preferences. Find what works best for you. Some of the following things may help: °· Eat and drink slowly. °· Eat 5-6 small meals daily instead of 3 large meals.   °· Eat crackers before you get out of bed in the morning.   °· Starchy foods are usually well tolerated (such as cereal, toast, bread, potatoes, pasta, rice, and pretzels).   °· Ginger may help with nausea. Add ¼ tsp ground ginger to hot tea or choose ginger tea.   °· Try drinking 100% fruit juice or an electrolyte drink. °· Continue to take your prenatal vitamins as directed by your health care provider. If you are having trouble taking your prenatal vitamins, talk with your health care provider about different options. °· Include at least 1 serving of protein with your meals and snacks (such as meats or poultry, beans, nuts, eggs, or yogurt). Try eating a protein-rich snack before bed (such as cheese and crackers or a half turkey or peanut butter sandwich). °  WHAT THINGS SHOULD I AVOID TO REDUCE MY SYMPTOMS? °The following things may help reduce your symptoms: °· Avoid foods with strong smells. Try eating meals in well-ventilated areas that are free of odors. °· Avoid drinking water or other beverages with meals. Try not to drink anything less than 30 minutes before and after meals. °· Avoid drinking more than 1 cup of fluid at a time. °· Avoid fried or high-fat foods, such as butter and cream sauces. °· Avoid spicy foods. °· Avoid skipping meals the best you can.  Nausea can be more intense on an empty stomach. If you cannot tolerate food at that time, do not force it. Try sucking on ice chips or other frozen items and make up the calories later. °· Avoid lying down within 2 hours after eating. °  °This information is not intended to replace advice given to you by your health care provider. Make sure you discuss any questions you have with your health care provider. °  °Document Released: 07/22/2007 Document Revised: 09/29/2013 Document Reviewed: 07/29/2013 °Elsevier Interactive Patient Education ©2016 Else[employer] Inc. ° °

## 2016-01-05 NOTE — ED Notes (Signed)
Pt here with c/o vomiting and weakness, states she just found out she was pregnant a few weeks ago and has been vomiting daily for about 6 weeks now. She has been able to hydrate, however, today she was too weak to go into work. Pt appears in no distress.

## 2016-01-05 NOTE — ED Notes (Signed)
See triage note.. States she has been having some n/v but is able to keep food and fluids down.  But was unable to got to work today  Scheduled to see [facility] next week.  Denies any vaginal cramping or bleeding

## 2016-01-05 NOTE — ED Provider Notes (Signed)
[facility] Emergency Department Provider Note ____________________________________________  Time seen: Approximately 12:05 PM  I have reviewed the triage vital signs and the nursing notes.   HISTORY  Chief Complaint Nausea    HPI Carolyn Cox is a 30 y.o. female patient complaining of vomiting weakness secondary to vomiting. Patient states about 6 weeks' gestation. This is her second child. Patient state vomiting causing loss of bladder control. Denies any diarrhea. Patient scheduled to see her OB doctor next week. Patient denies any other abdominal pain or vaginal bleeding. Patient also states she has not started prenatal vitamins. No palliative measures taken for these complaints. Patient is able to tolerate food and fluids. History reviewed. No pertinent past medical history.  There are no active problems to display for this patient.   Past Surgical History  Procedure Laterality Date  . Mandible fracture surgery      Current Outpatient Rx  Name  Route  Sig  Dispense  Refill  . doxycycline (VIBRAMYCIN) 100 MG capsule   Oral   Take 1 capsule (100 mg total) by mouth 2 (two) times daily.   20 capsule   0   . HYDROcodone-acetaminophen (NORCO/VICODIN) 5-325 MG tablet   Oral   Take 1 tablet by mouth every 4 (four) hours as needed for moderate pain.   15 tablet   0   . ibuprofen (ADVIL,MOTRIN) 50 MG chewable tablet   Oral   Chew 1 tablet (50 mg total) by mouth every 8 (eight) hours as needed for fever.   24 tablet   2   . ibuprofen (ADVIL,MOTRIN) 600 MG tablet   Oral   Take 1 tablet (600 mg total) by mouth every 6 (six) hours as needed.   30 tablet   0   . meloxicam (MOBIC) 15 MG tablet   Oral   Take 1 tablet (15 mg total) by mouth daily.   30 tablet   0   . metroNIDAZOLE (FLAGYL) 500 MG tablet   Oral   Take 1 tablet (500 mg total) by mouth 2 (two) times daily.   14 tablet   0   . oxyCODONE-acetaminophen (ROXICET) 5-325 MG tablet    Oral   Take 1 tablet by mouth every 6 (six) hours as needed for severe pain.   10 tablet   0   . Prenatal Vit-Fe Fumarate-FA (PRENATAL VITAMINS PLUS) 27-1 MG TABS   Oral   Take 1 tablet by mouth every morning.   30 tablet   0   . promethazine (PHENERGAN) 25 MG tablet   Oral   Take 1 tablet (25 mg total) by mouth every 6 (six) hours as needed for nausea or vomiting.   30 tablet   0     Allergies Review of patient's allergies indicates no known allergies.  No family history on file.  Social History Social History  Substance Use Topics  . Smoking status: Former Games developer  . Smokeless tobacco: None  . Alcohol Use: Yes    Review of Systems Constitutional: No fever/chills Eyes: No visual changes. ENT: No sore throat. Cardiovascular: Denies chest pain. Respiratory: Denies shortness of breath. Gastrointestinal: No abdominal pain. Nausea and vomiting secondary to gestation.  No diarrhea.  No constipation. Genitourinary: Negative for dysuria. Musculoskeletal: Negative for back pain. Skin: Negative for rash. Neurological: Negative for headaches, focal weakness or numbness.   ____________________________________________   PHYSICAL EXAM:  VITAL SIGNS: ED Triage Vitals  Enc Vitals Group     BP 01/05/16 1100 111/71  mmHg     Pulse Rate 01/05/16 1100 93     Resp 01/05/16 1100 18     Temp 01/05/16 1100 98.4 F (36.9 C)     Temp Source 01/05/16 1100 Oral     SpO2 01/05/16 1100 99 %     Weight 01/05/16 1100 123 lb (55.792 kg)     Height 01/05/16 1100  (1.676 m)     Head Cir --      Peak Flow --      Pain Score --      Pain Loc --      Pain Edu? --      Excl. in GC? --     Constitutional: Alert and oriented. Well appearing and in no acute distress. Eyes: Conjunctivae are normal. PERRL. EOMI. Head: Atraumatic. Nose: No congestion/rhinnorhea. Mouth/Throat: Mucous membranes are moist.  Oropharynx non-erythematous. Neck: No stridor.  No cervical spine tenderness to  palpation. Hematological/Lymphatic/Immunilogical: No cervical lymphadenopathy. Cardiovascular: Normal rate, regular rhythm. Grossly normal heart sounds.  Good peripheral circulation. Respiratory: Normal respiratory effort.  No retractions. Lungs CTAB. Gastrointestinal: Soft and nontender. No distention. No abdominal bruits. No CVA tenderness. Musculoskeletal: No lower extremity tenderness nor edema.  No joint effusions. Neurologic:  Normal speech and language. No gross focal neurologic deficits are appreciated. No gait instability. Skin:  Skin is warm, dry and intact. No rash noted. Psychiatric: Mood and affect are normal. Speech and behavior are normal.  ____________________________________________   LABS (all labs ordered are listed, but only abnormal results are displayed)  Labs Reviewed  POCT PREGNANCY, URINE - Abnormal; Notable for the following:    Preg Test, Ur POSITIVE (*)    All other components within normal limits   ____________________________________________  EKG   ____________________________________________  RADIOLOGY   ____________________________________________   PROCEDURES  Procedure(s) performed: None  Critical Care performed: No  ____________________________________________   INITIAL IMPRESSION / ASSESSMENT AND PLAN / ED COURSE  Pertinent labs & imaging results that were available during my care of the patient were reviewed by me and considered in my medical decision making (see chart for details).  Nausea secondary to early gestation. Patient given discharge care instructions. Patient in a prescription for Phenergan and prenatal vitamins. Patient advised to follow-up with scheduled OB appointment next week. Return by ER if condition worsens. ____________________________________________   FINAL CLINICAL IMPRESSION(S) / ED DIAGNOSES  Final diagnoses:  Hyperemesis gravidarum      Joni Reining, PA-C 01/05/16 1221  Jene Every,  MD 01/05/16 [phone]

## 2016-01-08 ENCOUNTER — Encounter: Payer: Self-pay | Admitting: Emergency Medicine

## 2016-01-08 DIAGNOSIS — Z87891 Personal history of nicotine dependence: Secondary | ICD-10-CM | POA: Diagnosis not present

## 2016-01-08 DIAGNOSIS — O210 Mild hyperemesis gravidarum: Secondary | ICD-10-CM | POA: Insufficient documentation

## 2016-01-08 DIAGNOSIS — Z3A11 11 weeks gestation of pregnancy: Secondary | ICD-10-CM | POA: Diagnosis not present

## 2016-01-08 LAB — COMPREHENSIVE METABOLIC PANEL
ALBUMIN: 3.8 g/dL (ref 3.5–5.0)
ALT: 14 U/L (ref 14–54)
ANION GAP: 7 (ref 5–15)
AST: 22 U/L (ref 15–41)
Alkaline Phosphatase: 56 U/L (ref 38–126)
BUN: 8 mg/dL (ref 6–20)
CHLORIDE: 103 mmol/L (ref 101–111)
CO2: 25 mmol/L (ref 22–32)
Calcium: 9.4 mg/dL (ref 8.9–10.3)
Creatinine, Ser: 0.38 mg/dL — ABNORMAL LOW (ref 0.44–1.00)
GFR calc Af Amer: >60 mL/min (ref >60)
GFR calc non Af Amer: >60 mL/min (ref >60)
GLUCOSE: 112 mg/dL — AB (ref 65–99)
POTASSIUM: 3.6 mmol/L (ref 3.5–5.1)
SODIUM: 135 mmol/L (ref 135–145)
TOTAL PROTEIN: 7.5 g/dL (ref 6.5–8.1)
Total Bilirubin: 0.1 mg/dL — ABNORMAL LOW (ref 0.3–1.2)

## 2016-01-08 LAB — URINALYSIS COMPLETE WITH MICROSCOPIC (ARMC ONLY)
Bilirubin Urine: NEGATIVE
GLUCOSE, UA: NEGATIVE mg/dL
HGB URINE DIPSTICK: NEGATIVE
Leukocytes, UA: NEGATIVE
NITRITE: NEGATIVE
PROTEIN: 30 mg/dL — AB
SPECIFIC GRAVITY, URINE: 1.028 (ref 1.005–1.030)
pH: 5 (ref 5.0–8.0)

## 2016-01-08 LAB — POCT PREGNANCY, URINE: PREG TEST UR: POSITIVE — AB

## 2016-01-08 LAB — CBC
HEMATOCRIT: 34.4 % — AB (ref 35.0–47.0)
HEMOGLOBIN: 11.6 g/dL — AB (ref 12.0–16.0)
MCH: 27.8 pg (ref 26.0–34.0)
MCHC: 33.8 g/dL (ref 32.0–36.0)
MCV: 82.4 fL (ref 80.0–100.0)
Platelets: 210 10*3/uL (ref 150–440)
RBC: 4.17 MIL/uL (ref 3.80–5.20)
RDW: 13.4 % (ref 11.5–14.5)
WBC: 12.4 10*3/uL — ABNORMAL HIGH (ref 3.6–11.0)

## 2016-01-08 LAB — LIPASE, BLOOD: Lipase: 28 U/L (ref 11–51)

## 2016-01-08 NOTE — ED Notes (Signed)
Patient states that she is about [redacted] weeks pregnant. Patient reports that she has had nausea and vomiting. Patient state that she has vomited 4 times today. Patient states she was seen here Thursday for the same and was prescribed phenergan. Patient reports that the phenergan has not been helping today.

## 2016-01-09 ENCOUNTER — Emergency Department
Admission: EM | Admit: 2016-01-09 | Discharge: 2016-01-09 | Disposition: A | Discharge status: Home / Self Care | Payer: Medicaid Other | Attending: Emergency Medicine | Admitting: Emergency Medicine

## 2016-01-09 DIAGNOSIS — O210 Mild hyperemesis gravidarum: Secondary | ICD-10-CM

## 2016-01-09 MED ORDER — DOXYLAMINE-PYRIDOXINE 10-10 MG PO TBEC: DELAYED_RELEASE_TABLET | ORAL | Status: DC

## 2016-01-09 MED ORDER — ONDANSETRON 4 MG PO TBDP
4.0000 mg | ORAL_TABLET | Freq: Once | ORAL | Status: AC
  Administered 2016-01-09: 4 mg via ORAL
  Filled 2016-01-09: qty 1

## 2016-01-09 MED ORDER — ONDANSETRON HCL 4 MG PO TABS: ORAL_TABLET | ORAL | Status: DC

## 2016-01-09 NOTE — ED Provider Notes (Signed)
[facility] Emergency Department Provider Note  ____________________________________________  Time seen: Approximately 1:17 AM  I have reviewed the triage vital signs and the nursing notes.   HISTORY  Chief Complaint Emesis    HPI Carolyn Cox is a 30 y.o. female G2 P1 at approximately [redacted] weeks gestation.  She just recently found out she is pregnant and is due to see [facility] in several days for her first appointment.  She reports that she has had problems with nausea and vomiting throughout her pregnancy which is the reason of her presentation tonight.  She reports that she has been nauseated throughout the day and vomiting multiple times.  She was seen in the emergency department last week and was given Phenergan but she reports that it is not helping.  She describes the symptoms as severe, made worse with food, nothing makes it better.  She reports that her nausea is improved significantly at this time.  She reports that she was unable to go to work today and would like a work note explaining that she was here.  At first she did not feel she needed any nausea medicine, when asked again she stated that she would like some Zofran.  She denies abdominal pain, vaginal bleeding, chest pain, shortness of breath, fever/chills.   History reviewed. No pertinent past medical history.  There are no active problems to display for this patient.   Past Surgical History  Procedure Laterality Date  . Mandible fracture surgery      Current Outpatient Rx  Name  Route  Sig  Dispense  Refill  . doxycycline (VIBRAMYCIN) 100 MG capsule   Oral   Take 1 capsule (100 mg total) by mouth 2 (two) times daily.   20 capsule   0   . Doxylamine-Pyridoxine (DICLEGIS) 10-10 MG TBEC      Two tablets at bedtime on day 1 and 2; if symptoms persist, take 1 tablet in morning and 2 tablets at bedtime on day 3; if symptoms persist, may increase to 1 tablet in morning, 1 tablet  mid-afternoon, and 2 tablets at bedtime on day 4 (maximum: doxylamine 40 mg/pyridoxine 40 mg (4 tablets) per day).   60 tablet   0   . HYDROcodone-acetaminophen (NORCO/VICODIN) 5-325 MG tablet   Oral   Take 1 tablet by mouth every 4 (four) hours as needed for moderate pain.   15 tablet   0   . ibuprofen (ADVIL,MOTRIN) 50 MG chewable tablet   Oral   Chew 1 tablet (50 mg total) by mouth every 8 (eight) hours as needed for fever.   24 tablet   2   . ibuprofen (ADVIL,MOTRIN) 600 MG tablet   Oral   Take 1 tablet (600 mg total) by mouth every 6 (six) hours as needed.   30 tablet   0   . meloxicam (MOBIC) 15 MG tablet   Oral   Take 1 tablet (15 mg total) by mouth daily.   30 tablet   0   . metroNIDAZOLE (FLAGYL) 500 MG tablet   Oral   Take 1 tablet (500 mg total) by mouth 2 (two) times daily.   14 tablet   0   . ondansetron (ZOFRAN) 4 MG tablet      Take 1-2 tabs by mouth every 8 hours as needed for nausea/vomiting   30 tablet   0   . oxyCODONE-acetaminophen (ROXICET) 5-325 MG tablet   Oral   Take 1 tablet by mouth every 6 (  six) hours as needed for severe pain.   10 tablet   0   . Prenatal Vit-Fe Fumarate-FA (PRENATAL VITAMINS PLUS) 27-1 MG TABS   Oral   Take 1 tablet by mouth every morning.   30 tablet   0   . promethazine (PHENERGAN) 25 MG tablet   Oral   Take 1 tablet (25 mg total) by mouth every 6 (six) hours as needed for nausea or vomiting.   30 tablet   0     Allergies Review of patient's allergies indicates no known allergies.  No family history on file.  Social History Social History  Substance Use Topics  . Smoking status: Former Games developer  . Smokeless tobacco: None  . Alcohol Use: No    Review of Systems Constitutional: No fever/chills Eyes: No visual changes. ENT: No sore throat. Cardiovascular: Denies chest pain. Respiratory: Denies shortness of breath. Gastrointestinal: No abdominal pain.  Multiple episodes of nausea and vomiting.  No  diarrhea.  No constipation. Genitourinary: Negative for dysuria.  No vaginal bleeding Musculoskeletal: Negative for back pain. Skin: Negative for rash. Neurological: Negative for headaches, focal weakness or numbness.  10-point ROS otherwise negative.  ____________________________________________   PHYSICAL EXAM:  VITAL SIGNS: ED Triage Vitals  Enc Vitals Group     BP --      Pulse --      Resp --      Temp --      Temp src --      SpO2 --      Weight --      Height --      Head Cir --      Peak Flow --      Pain Score --      Pain Loc --      Pain Edu? --      Excl. in GC? --     Constitutional: Alert and oriented. Well appearing and in no acute distress. Eyes: Conjunctivae are normal. PERRL. EOMI. Head: Atraumatic. Nose: No congestion/rhinnorhea. Mouth/Throat: Mucous membranes are moist.  Oropharynx non-erythematous. Neck: No stridor.  No meningeal signs.   Cardiovascular: Normal rate, regular rhythm. Good peripheral circulation. Grossly normal heart sounds.   Respiratory: Normal respiratory effort.  No retractions. Lungs CTAB. Gastrointestinal: Soft and nontender. No distention.  Genitourinary: Deferred Musculoskeletal: No lower extremity tenderness nor edema. No gross deformities of extremities. Neurologic:  Normal speech and language. No gross focal neurologic deficits are appreciated.  Skin:  Skin is warm, dry and intact. No rash noted. Psychiatric: Mood and affect are normal. Speech and behavior are normal.  Patient appears sleepy.  ____________________________________________   LABS (all labs ordered are listed, but only abnormal results are displayed)  Labs Reviewed  COMPREHENSIVE METABOLIC PANEL - Abnormal; Notable for the following:    Glucose, Bld 112 (*)    Creatinine, Ser 0.38 (*)    Total Bilirubin 0.1 (*)    All other components within normal limits  CBC - Abnormal; Notable for the following:    WBC 12.4 (*)    Hemoglobin 11.6 (*)    HCT  34.4 (*)    All other components within normal limits  URINALYSIS COMPLETEWITH MICROSCOPIC ([facility] ONLY) - Abnormal; Notable for the following:    Color, Urine YELLOW (*)    APPearance HAZY (*)    Ketones, ur TRACE (*)    Protein, ur 30 (*)    Bacteria, UA RARE (*)    Squamous Epithelial / LPF 0-5 (*)  All other components within normal limits  POCT PREGNANCY, URINE - Abnormal; Notable for the following:    Preg Test, Ur POSITIVE (*)    All other components within normal limits  LIPASE, BLOOD  POC URINE PREG, ED   ____________________________________________  EKG  None ____________________________________________  RADIOLOGY   No results found.  ____________________________________________   PROCEDURES  Procedure(s) performed: None  Critical Care performed: No ____________________________________________   INITIAL IMPRESSION / ASSESSMENT AND PLAN / ED COURSE  Pertinent labs & imaging results that were available during my care of the patient were reviewed by me and considered in my medical decision making (see chart for details).  The patient states she feels much better and wants to go home with a note for work.  I explained that she needs to follow up with her prenatal provider as recommended and I will switch her to Zofran as this may help more than Phenergan without the sedating effects.  Upon second thought, I will provide her with a prescription for Diclegis.  However, if it is too expensive for her even with her insurance, I will provide a Zofran prescription as well.  I have made clear in her discharge instructions that she should use one or the other, preferably the Diclegis.  She tolerated PO fluids in the ED without difficulty.  VSS.  She refused IV fluids.  ____________________________________________  FINAL CLINICAL IMPRESSION(S) / ED DIAGNOSES  Final diagnoses:  Hyperemesis gravidarum      NEW MEDICATIONS STARTED DURING THIS VISIT:  New  Prescriptions   DOXYLAMINE-PYRIDOXINE (DICLEGIS) 10-10 MG TBEC    Two tablets at bedtime on day 1 and 2; if symptoms persist, take 1 tablet in morning and 2 tablets at bedtime on day 3; if symptoms persist, may increase to 1 tablet in morning, 1 tablet mid-afternoon, and 2 tablets at bedtime on day 4 (maximum: doxylamine 40 mg/pyridoxine 40 mg (4 tablets) per day).   ONDANSETRON (ZOFRAN) 4 MG TABLET    Take 1-2 tabs by mouth every 8 hours as needed for nausea/vomiting      Note:  This document was prepared using Dragon voice recognition software and may include unintentional dictation errors.   Loleta Rose, MD 01/09/16 [phone]

## 2016-01-09 NOTE — Discharge Instructions (Signed)
You were evaluated today for nausea and vomiting during pregnancy.  It is safe for you to go home and follow-up as an outpatient since you are feeling better.  Please take your regular medications (unless otherwise canceled in these papers) and any medications prescribed today according to the written instructions.  Return to the emergency department if he develop any new or worsening symptoms that concern you.  Please note that we gave you to different prescription medications.  Try to fill the prescription for Diclegis, but first check with the pharmacist to see how much it will cost under your insurance.  It is too expensive, use the Zofran instead.  Do not take both medications.   Hyperemesis Gravidarum Hyperemesis gravidarum is a severe form of nausea and vomiting that happens during pregnancy. Hyperemesis is worse than morning sickness. It may cause you to have nausea or vomiting all day for many days. It may keep you from eating and drinking enough food and liquids. Hyperemesis usually occurs during the first half (the first 20 weeks) of pregnancy. It often goes away once a woman is in her second half of pregnancy. However, sometimes hyperemesis continues through an entire pregnancy.  CAUSES  The cause of this condition is not completely known but is thought to be related to changes in the body's hormones when pregnant. It could be from the high level of the pregnancy hormone or an increase in estrogen in the body.  SIGNS AND SYMPTOMS   Severe nausea and vomiting.  Nausea that does not go away.  Vomiting that does not allow you to keep any food down.  Weight loss and body fluid loss (dehydration).  Having no desire to eat or not liking food you have previously enjoyed. DIAGNOSIS  Your health care provider will do a physical exam and ask you about your symptoms. He or she may also order blood tests and urine tests to make sure something else is not causing the problem.  TREATMENT  You  may only need medicine to control the problem. If medicines do not control the nausea and vomiting, you will be treated in the hospital to prevent dehydration, increased acid in the blood (acidosis), weight loss, and changes in the electrolytes in your body that may harm the unborn baby (fetus). You may need IV fluids.  HOME CARE INSTRUCTIONS   Only take over-the-counter or prescription medicines as directed by your health care provider.  Try eating a couple of dry crackers or toast in the morning before getting out of bed.  Avoid foods and smells that upset your stomach.  Avoid fatty and spicy foods.  Eat 5-6 small meals a day.  Do not drink when eating meals. Drink between meals.  For snacks, eat high-protein foods, such as cheese.  Eat or suck on things that have ginger in them. Ginger helps nausea.  Avoid food preparation. The smell of food can spoil your appetite.  Avoid iron pills and iron in your multivitamins until after 3-4 months of being pregnant. However, consult with your health care provider before stopping any prescribed iron pills. SEEK MEDICAL CARE IF:   Your abdominal pain increases.  You have a severe headache.  You have vision problems.  You are losing weight. SEEK IMMEDIATE MEDICAL CARE IF:   You are unable to keep fluids down.  You vomit blood.  You have constant nausea and vomiting.  You have excessive weakness.  You have extreme thirst.  You have dizziness or fainting.  You have  a fever or persistent symptoms for more than 2-3 days.  You have a fever and your symptoms suddenly get worse. MAKE SURE YOU:   Understand these instructions.  Will watch your condition.  Will get help right away if you are not doing well or get worse.   This information is not intended to replace advice given to you by your health care provider. Make sure you discuss any questions you have with your health care provider.   Document Released: 09/24/2005  Document Revised: 07/15/2013 Document Reviewed: 05/06/2013 Elsevier Interactive Patient Education [employer]

## 2016-01-09 NOTE — ED Notes (Signed)
Pt. States "I just found out I am pregnant, I have apt. On Thursday".  Pt. States "I have vomited 4 times today.  Pt. States nausea throughout day.  Pt. States she was prescribed phenergan for nausea, but it only helps sometimes.

## 2016-02-09 ENCOUNTER — Emergency Department
Admission: EM | Admit: 2016-02-09 | Discharge: 2016-02-09 | Disposition: A | Discharge status: Home / Self Care | Payer: Medicaid Other | Attending: Emergency Medicine | Admitting: Emergency Medicine

## 2016-02-09 ENCOUNTER — Encounter: Payer: Self-pay | Admitting: Emergency Medicine

## 2016-02-09 DIAGNOSIS — O99322 Drug use complicating pregnancy, second trimester: Secondary | ICD-10-CM | POA: Insufficient documentation

## 2016-02-09 DIAGNOSIS — Z87891 Personal history of nicotine dependence: Secondary | ICD-10-CM | POA: Insufficient documentation

## 2016-02-09 DIAGNOSIS — F1123 Opioid dependence with withdrawal: Secondary | ICD-10-CM | POA: Diagnosis present

## 2016-02-09 DIAGNOSIS — Z791 Long term (current) use of non-steroidal anti-inflammatories (NSAID): Secondary | ICD-10-CM | POA: Diagnosis not present

## 2016-02-09 DIAGNOSIS — F1193 Opioid use, unspecified with withdrawal: Secondary | ICD-10-CM

## 2016-02-09 DIAGNOSIS — Z3A15 15 weeks gestation of pregnancy: Secondary | ICD-10-CM | POA: Insufficient documentation

## 2016-02-09 DIAGNOSIS — Z79899 Other long term (current) drug therapy: Secondary | ICD-10-CM | POA: Insufficient documentation

## 2016-02-09 NOTE — ED Notes (Addendum)
Pt to ed with [facility] for medical clearance.  Pt is currently [redacted] weeks pregnant and is needing to be transferred to [place] to another correctional facility.  They are requiring a doctor to medically clear her for transfer.  Pt denies vomiting.  States she did use percocet early in pregnancy.

## 2016-02-09 NOTE — ED Notes (Signed)
BEHAVIORAL HEALTH ROUNDING Patient sleeping: No. Patient alert and oriented: yes Behavior appropriate: Yes.  ; If no, describe:  Nutrition and fluids offered: yes Toileting and hygiene offered: Yes  Sitter present: q15 minute observations and security  monitoring Law enforcement present: Yes  ODS  

## 2016-02-09 NOTE — ED Notes (Signed)
Lunch provided along with a ginger ale 

## 2016-02-09 NOTE — ED Notes (Signed)
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
## 2016-02-09 NOTE — Discharge Instructions (Signed)
Opioid Withdrawal  Opioids are a group of narcotic drugs. They include the street drug heroin. They also include pain medicines, such as morphine, hydrocodone, oxycodone, and fentanyl. Opioid withdrawal is a group of characteristic physical and mental signs and symptoms. It typically occurs if you have been using opioids daily for several weeks or longer and stop using or rapidly decrease use. Opioid withdrawal can also occur if you have used opioids daily for a long time and are given a medicine to block the effect.   SIGNS AND SYMPTOMS  Opioid withdrawal includes three or more of the following symptoms:   · Depressed, anxious, or irritable mood.  · Nausea or vomiting.  · Muscle aches or spasms.    · Watery eyes.     · Runny nose.  · Dilated pupils, sweating, or hairs standing on end.  · Diarrhea or intestinal cramping.  · Yawning.    · Fever.  · Increased blood pressure.  · Fast pulse.  · Restlessness or trouble sleeping.  These signs and symptoms occur within several hours of stopping or reducing short-acting opioids, such as heroin. They can occur within 3 days of stopping or reducing long-acting opioids, such as methadone. Withdrawal begins within minutes of receiving a drug that blocks the effects of opioids, such as naltrexone or naloxone.  DIAGNOSIS   Opioid use disorder is diagnosed by your health care provider. You will be asked about your symptoms, drug and alcohol use, medical history, and use of medicines. A physical exam may be done. Lab tests may be ordered. Your health care provider may have you see a mental health professional.   TREATMENT   The treatment for opioid withdrawal is usually provided by medical doctors with special training in substance use disorders (addiction specialists). The following medicines may be included in treatment:  · Opioids given in place of the abused opioid. They turn on opioid receptors in the brain and lessen or prevent withdrawal symptoms. They are gradually  decreased (opioid substitution and taper).  · Non-opioids that can lessen certain opioid withdrawal symptoms. They may be used alone or with opioid substitution and taper.  Successful long-term recovery usually requires medicine, counseling, and group support.  HOME CARE INSTRUCTIONS   · Take medicines only as directed by your health care provider.  · Check with your health care provider before starting new medicines.  · Keep all follow-up visits as directed by your health care provider.  SEEK MEDICAL CARE IF:  · You are not able to take your medicines as directed.  · Your symptoms get worse.  · You relapse.  SEEK IMMEDIATE MEDICAL CARE IF:  · You have serious thoughts about hurting yourself or others.  · You have a seizure.  · You lose consciousness.     This information is not intended to replace advice given to you by your health care provider. Make sure you discuss any questions you have with your health care provider.     Document Released: 09/27/2003 Document Revised: 10/15/2014 Document Reviewed: 10/07/2013  Elsevier Interactive Patient Education ©2016 Elsevier Inc[employer]

## 2016-06-14 ENCOUNTER — Emergency Department: Payer: Medicaid Other

## 2016-06-14 ENCOUNTER — Encounter: Payer: Self-pay | Admitting: Emergency Medicine

## 2016-06-14 ENCOUNTER — Emergency Department
Admission: EM | Admit: 2016-06-14 | Discharge: 2016-06-14 | Disposition: A | Discharge status: Home / Self Care | Payer: Medicaid Other | Attending: Emergency Medicine | Admitting: Emergency Medicine

## 2016-06-14 DIAGNOSIS — Z87891 Personal history of nicotine dependence: Secondary | ICD-10-CM | POA: Insufficient documentation

## 2016-06-14 DIAGNOSIS — S60051A Contusion of right little finger without damage to nail, initial encounter: Secondary | ICD-10-CM | POA: Diagnosis not present

## 2016-06-14 DIAGNOSIS — T148XXA Other injury of unspecified body region, initial encounter: Secondary | ICD-10-CM

## 2016-06-14 DIAGNOSIS — Y92410 Unspecified street and highway as the place of occurrence of the external cause: Secondary | ICD-10-CM | POA: Insufficient documentation

## 2016-06-14 DIAGNOSIS — S8001XA Contusion of right knee, initial encounter: Secondary | ICD-10-CM | POA: Diagnosis not present

## 2016-06-14 DIAGNOSIS — Y999 Unspecified external cause status: Secondary | ICD-10-CM | POA: Diagnosis not present

## 2016-06-14 DIAGNOSIS — Y9389 Activity, other specified: Secondary | ICD-10-CM | POA: Insufficient documentation

## 2016-06-14 DIAGNOSIS — M25561 Pain in right knee: Secondary | ICD-10-CM | POA: Diagnosis present

## 2016-06-14 DIAGNOSIS — Z79899 Other long term (current) drug therapy: Secondary | ICD-10-CM | POA: Insufficient documentation

## 2016-06-14 MED ORDER — ACETAMINOPHEN 325 MG PO TABS: 650.0000 mg | ORAL_TABLET | Freq: Once | ORAL | Status: DC

## 2016-06-14 MED ORDER — OXYCODONE-ACETAMINOPHEN 5-325 MG PO TABS
ORAL_TABLET | ORAL | Status: AC
  Administered 2016-06-14: 1 via ORAL
  Filled 2016-06-14: qty 1

## 2016-06-14 MED ORDER — OXYCODONE-ACETAMINOPHEN 5-325 MG PO TABS
1.0000 | ORAL_TABLET | Freq: Once | ORAL | Status: AC
  Administered 2016-06-14: 1 via ORAL
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
## 2016-06-14 NOTE — ED Notes (Signed)
AAOx3.  Skin warm and dry.  Ambulates with an easy and steady gait.  Posture upright and relaxed.  NAD

## 2016-06-14 NOTE — ED Notes (Signed)
Patient asked for food , took patient lunch tray.
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
## 2016-07-08 ENCOUNTER — Encounter: Payer: Self-pay | Admitting: *Deleted

## 2016-07-08 ENCOUNTER — Observation Stay
Admission: EM | Admit: 2016-07-08 | Discharge: 2016-07-08 | Disposition: A | Discharge status: Home / Self Care | Payer: Medicaid Other | Attending: Obstetrics and Gynecology | Admitting: Obstetrics and Gynecology

## 2016-07-08 DIAGNOSIS — O479 False labor, unspecified: Secondary | ICD-10-CM | POA: Diagnosis present

## 2016-07-08 NOTE — Final Progress Note (Addendum)
Physician Final Progress Note  Patient ID: Carolyn Cox MRN: [id number] DOB/AGE: 01/28/1986 28 y.o.  Admit date: 07/08/2016 Admitting provider: Vena AustriaAndreas Dionis Autry, MD Discharge date: 07/08/2016   Admission Diagnoses: Irregular uterine contractions  Discharge Diagnoses:  Active Problems:   Irregular uterine contractions   Consults: None  Significant Findings/ Diagnostic Studies: none  Procedures: NST 130, moderate, variability, +accels.  Toco no contractions patient sleeping.  Patient was kept for extended monitoring after nursing staff felt they heard a fetal deceration although this did not pick up on monitoring and happened with a position change.    Discharge Condition: good  Disposition: 01-Home or Self Care  Diet: Regular diet  Discharge Activity: Activity as tolerated  Discharge Instructions    Discharge activity:  No Restrictions    Complete by:  As directed    Discharge diet:  No restrictions    Complete by:  As directed    Fetal Kick Count:  Lie on our left side for one hour after a meal, and count the number of times your baby kicks.  If it is less than 5 times, get up, move around and drink some juice.  Repeat the test 30 minutes later.  If it is still less than 5 kicks in an hour, notify your doctor.    Complete by:  As directed    LABOR:  When conractions begin, you should start to time them from the beginning of one contraction to the beginning  of the next.  When contractions are 5 - 10 minutes apart or less and have been regular for at least an hour, you should call your health care provider.    Complete by:  As directed    No sexual activity restrictions    Complete by:  As directed    Notify physician for bleeding from the vagina    Complete by:  As directed    Notify physician for blurring of vision or spots before the eyes    Complete by:  As directed    Notify physician for chills or fever    Complete by:  As directed    Notify physician for fainting  spells, "black outs" or loss of consciousness    Complete by:  As directed    Notify physician for increase in vaginal discharge    Complete by:  As directed    Notify physician for leaking of fluid    Complete by:  As directed    Notify physician for pain or burning when urinating    Complete by:  As directed    Notify physician for pelvic pressure (sudden increase)    Complete by:  As directed    Notify physician for severe or continued nausea or vomiting    Complete by:  As directed    Notify physician for sudden gushing of fluid from the vagina (with or without continued leaking)    Complete by:  As directed    Notify physician for sudden, constant, or occasional abdominal pain    Complete by:  As directed    Notify physician if baby moving less than usual    Complete by:  As directed        Medication List    STOP taking these medications   doxycycline 100 MG capsule Commonly known as:  VIBRAMYCIN   HYDROcodone-acetaminophen 5-325 MG tablet Commonly known as:  NORCO/VICODIN   ibuprofen 50 MG chewable tablet Commonly known as:  ADVIL,MOTRIN   ibuprofen 600 MG tablet Commonly known  as:  ADVIL,MOTRIN   meloxicam 15 MG tablet Commonly known as:  MOBIC   metroNIDAZOLE 500 MG tablet Commonly known as:  FLAGYL   oxyCODONE-acetaminophen 5-325 MG tablet Commonly known as:  ROXICET     TAKE these medications   Doxylamine-Pyridoxine 10-10 MG Tbec Commonly known as:  DICLEGIS Two tablets at bedtime on day 1 and 2; if symptoms persist, take 1 tablet in morning and 2 tablets at bedtime on day 3; if symptoms persist, may increase to 1 tablet in morning, 1 tablet mid-afternoon, and 2 tablets at bedtime on day 4 (maximum: doxylamine 40 mg/pyridoxine 40 mg (4 tablets) per day).   ondansetron 4 MG tablet Commonly known as:  ZOFRAN Take 1-2 tabs by mouth every 8 hours as needed for nausea/vomiting   PRENATAL VITAMINS PLUS 27-1 MG Tabs Take 1 tablet by mouth every morning.    promethazine 25 MG tablet Commonly known as:  PHENERGAN Take 1 tablet (25 mg total) by mouth every 6 (six) hours as needed for nausea or vomiting.        Total time spent taking care of this patient: 10 minutes (patient triaged remotely)  Signed: Lorrene Reid 07/08/2016, 3:56 AM

## 2016-07-13 ENCOUNTER — Observation Stay
Admission: EM | Admit: 2016-07-13 | Discharge: 2016-07-13 | Disposition: A | Discharge status: Home / Self Care | Payer: Medicaid Other | Attending: Obstetrics & Gynecology | Admitting: Obstetrics & Gynecology

## 2016-07-13 DIAGNOSIS — R109 Unspecified abdominal pain: Secondary | ICD-10-CM | POA: Diagnosis not present

## 2016-07-13 DIAGNOSIS — O2690 Pregnancy related conditions, unspecified, unspecified trimester: Secondary | ICD-10-CM | POA: Diagnosis not present

## 2016-07-13 DIAGNOSIS — O479 False labor, unspecified: Principal | ICD-10-CM | POA: Insufficient documentation

## 2016-07-13 DIAGNOSIS — O26899 Other specified pregnancy related conditions, unspecified trimester: Secondary | ICD-10-CM | POA: Diagnosis present

## 2016-07-13 MED ORDER — ONDANSETRON HCL 4 MG/2ML IJ SOLN: 4.0000 mg | Freq: Four times a day (QID) | INTRAMUSCULAR | Status: DC | PRN

## 2016-07-13 MED ORDER — ACETAMINOPHEN 325 MG PO TABS: 650.0000 mg | ORAL_TABLET | ORAL | Status: DC | PRN

## 2016-07-13 NOTE — Final Progress Note (Signed)
Physician Final Progress Note  Patient ID: Carolyn Cox MRN: [id number] DOB/AGE: [birth year] 28 y.o.  Admit date: 07/13/2016 Admitting provider: Nadara Mustard, MD Discharge date: 07/13/2016  Admission Diagnoses: Abdominal Pain and Contractions  Discharge Diagnoses:  Active Problems:   Abdominal pain affecting pregnancy   False labor contractions  Consults: None  Significant Findings/ Diagnostic Studies: No cervical change and minimal contractions on monitor  Procedures: A NST procedure was performed with FHR monitoring and a normal baseline established, appropriate time of 20-40 minutes of evaluation, and accels >2 seen w 15x15 characteristics.  Results show a REACTIVE NST.   Discharge Condition: good  Disposition: 01-Home or Self Care  Diet: Regular diet  Discharge Activity: Activity as tolerated     Medication List    TAKE these medications   Doxylamine-Pyridoxine 10-10 MG Tbec Commonly known as:  DICLEGIS Two tablets at bedtime on day 1 and 2; if symptoms persist, take 1 tablet in morning and 2 tablets at bedtime on day 3; if symptoms persist, may increase to 1 tablet in morning, 1 tablet mid-afternoon, and 2 tablets at bedtime on day 4 (maximum: doxylamine 40 mg/pyridoxine 40 mg (4 tablets) per day).   ondansetron 4 MG tablet Commonly known as:  ZOFRAN Take 1-2 tabs by mouth every 8 hours as needed for nausea/vomiting   PRENATAL VITAMINS PLUS 27-1 MG Tabs Take 1 tablet by mouth every morning.   promethazine 25 MG tablet Commonly known as:  PHENERGAN Take 1 tablet (25 mg total) by mouth every 6 (six) hours as needed for nausea or vomiting.        Total time spent taking care of this patient: TRIAGE  Signed: Letitia Libra 07/13/2016, 2:37 AM

## 2016-07-13 NOTE — Discharge Summary (Signed)
  See FPN 

## 2016-07-17 ENCOUNTER — Inpatient Hospital Stay: Payer: Medicaid Other | Admitting: Certified Registered Nurse Anesthetist

## 2016-07-17 ENCOUNTER — Inpatient Hospital Stay
Admission: EM | Admit: 2016-07-17 | Discharge: 2016-07-18 | DRG: 775 | Disposition: A | Discharge status: Home / Self Care | Payer: Medicaid Other | Attending: Obstetrics and Gynecology | Admitting: Obstetrics and Gynecology

## 2016-07-17 DIAGNOSIS — K219 Gastro-esophageal reflux disease without esophagitis: Secondary | ICD-10-CM | POA: Diagnosis present

## 2016-07-17 DIAGNOSIS — Z87891 Personal history of nicotine dependence: Secondary | ICD-10-CM

## 2016-07-17 DIAGNOSIS — O9962 Diseases of the digestive system complicating childbirth: Secondary | ICD-10-CM | POA: Diagnosis present

## 2016-07-17 DIAGNOSIS — Z3A38 38 weeks gestation of pregnancy: Secondary | ICD-10-CM

## 2016-07-17 DIAGNOSIS — Z3483 Encounter for supervision of other normal pregnancy, third trimester: Secondary | ICD-10-CM | POA: Diagnosis present

## 2016-07-17 LAB — TYPE AND SCREEN
ABO/RH(D): O POS
ANTIBODY SCREEN: NEGATIVE

## 2016-07-17 LAB — CBC
HEMATOCRIT: 33.3 % — AB (ref 35.0–47.0)
HEMOGLOBIN: 11.3 g/dL — AB (ref 12.0–16.0)
MCH: 28.1 pg (ref 26.0–34.0)
MCHC: 33.8 g/dL (ref 32.0–36.0)
MCV: 83.2 fL (ref 80.0–100.0)
Platelets: 157 10*3/uL (ref 150–440)
RBC: 4 MIL/uL (ref 3.80–5.20)
RDW: 13.1 % (ref 11.5–14.5)
WBC: 14.2 10*3/uL — AB (ref 3.6–11.0)

## 2016-07-17 MED ORDER — DIBUCAINE 1 % RE OINT: 1.0000 "application " | TOPICAL_OINTMENT | RECTAL | Status: DC | PRN

## 2016-07-17 MED ORDER — ONDANSETRON HCL 4 MG/2ML IJ SOLN: 4.0000 mg | INTRAMUSCULAR | Status: DC | PRN

## 2016-07-17 MED ORDER — SIMETHICONE 80 MG PO CHEW: 80.0000 mg | CHEWABLE_TABLET | ORAL | Status: DC | PRN

## 2016-07-17 MED ORDER — LIDOCAINE HCL (PF) 1 % IJ SOLN
INTRAMUSCULAR | Status: AC
  Filled 2016-07-17: qty 30

## 2016-07-17 MED ORDER — CALCIUM CARBONATE ANTACID 500 MG PO CHEW
CHEWABLE_TABLET | ORAL | Status: AC
  Administered 2016-07-17: 200 mg
  Filled 2016-07-17: qty 1

## 2016-07-17 MED ORDER — LACTATED RINGERS IV SOLN: 500.0000 mL | INTRAVENOUS | Status: DC | PRN

## 2016-07-17 MED ORDER — ACETAMINOPHEN 325 MG PO TABS: 650.0000 mg | ORAL_TABLET | ORAL | Status: DC | PRN

## 2016-07-17 MED ORDER — LACTATED RINGERS IV SOLN
INTRAVENOUS | Status: DC
  Administered 2016-07-17: 06:00:00 via INTRAVENOUS

## 2016-07-17 MED ORDER — DIPHENHYDRAMINE HCL 25 MG PO CAPS: 25.0000 mg | ORAL_CAPSULE | Freq: Four times a day (QID) | ORAL | Status: DC | PRN

## 2016-07-17 MED ORDER — FENTANYL 2.5 MCG/ML W/ROPIVACAINE 0.2% IN NS 100 ML EPIDURAL INFUSION (ARMC-ANES)
EPIDURAL | Status: AC
  Filled 2016-07-17: qty 100

## 2016-07-17 MED ORDER — OXYCODONE-ACETAMINOPHEN 5-325 MG PO TABS
1.0000 | ORAL_TABLET | ORAL | Status: DC | PRN
  Administered 2016-07-17 (×2): 2 via ORAL
  Filled 2016-07-17 (×2): qty 2

## 2016-07-17 MED ORDER — TETANUS-DIPHTH-ACELL PERTUSSIS 5-2.5-18.5 LF-MCG/0.5 IM SUSP: 0.5000 mL | Freq: Once | INTRAMUSCULAR | Status: DC

## 2016-07-17 MED ORDER — MISOPROSTOL 200 MCG PO TABS
ORAL_TABLET | ORAL | Status: AC
Start: 2016-07-17 — End: 2016-07-17
  Filled 2016-07-17: qty 4

## 2016-07-17 MED ORDER — SENNOSIDES-DOCUSATE SODIUM 8.6-50 MG PO TABS
2.0000 | ORAL_TABLET | ORAL | Status: DC
  Administered 2016-07-18: 2 via ORAL
  Filled 2016-07-17: qty 2

## 2016-07-17 MED ORDER — WITCH HAZEL-GLYCERIN EX PADS: 1.0000 "application " | MEDICATED_PAD | CUTANEOUS | Status: DC | PRN

## 2016-07-17 MED ORDER — HYDROCODONE-ACETAMINOPHEN 5-325 MG PO TABS
1.0000 | ORAL_TABLET | ORAL | Status: DC | PRN
Start: 2016-07-17 — End: 2016-07-18
  Administered 2016-07-17 – 2016-07-18 (×3): 2 via ORAL
  Filled 2016-07-17 (×3): qty 2

## 2016-07-17 MED ORDER — BUTORPHANOL TARTRATE 1 MG/ML IJ SOLN
1.0000 mg | INTRAMUSCULAR | Status: DC | PRN
  Administered 2016-07-17: 1 mg via INTRAVENOUS
  Filled 2016-07-17: qty 1

## 2016-07-17 MED ORDER — IBUPROFEN 600 MG PO TABS
600.0000 mg | ORAL_TABLET | Freq: Four times a day (QID) | ORAL | Status: DC
  Administered 2016-07-17 – 2016-07-18 (×6): 600 mg via ORAL
  Filled 2016-07-17 (×5): qty 1

## 2016-07-17 MED ORDER — OXYTOCIN 40 UNITS IN LACTATED RINGERS INFUSION - SIMPLE MED
2.5000 [IU]/h | INTRAVENOUS | Status: DC
  Filled 2016-07-17: qty 1000

## 2016-07-17 MED ORDER — ONDANSETRON HCL 4 MG PO TABS: 4.0000 mg | ORAL_TABLET | ORAL | Status: DC | PRN

## 2016-07-17 MED ORDER — OXYTOCIN 10 UNIT/ML IJ SOLN
INTRAMUSCULAR | Status: AC
  Filled 2016-07-17: qty 2

## 2016-07-17 MED ORDER — CALCIUM CARBONATE ANTACID 500 MG PO CHEW
400.0000 mg | CHEWABLE_TABLET | Freq: Three times a day (TID) | ORAL | Status: DC
  Administered 2016-07-17 (×2): 400 mg via ORAL
  Filled 2016-07-17 (×2): qty 2

## 2016-07-17 MED ORDER — AMMONIA AROMATIC IN INHA
RESPIRATORY_TRACT | Status: AC
  Filled 2016-07-17: qty 10

## 2016-07-17 MED ORDER — IBUPROFEN 600 MG PO TABS
ORAL_TABLET | ORAL | Status: AC
  Administered 2016-07-17: 600 mg via ORAL
  Filled 2016-07-17: qty 1

## 2016-07-17 MED ORDER — ONDANSETRON HCL 4 MG/2ML IJ SOLN: 4.0000 mg | Freq: Four times a day (QID) | INTRAMUSCULAR | Status: DC | PRN

## 2016-07-17 MED ORDER — OXYTOCIN BOLUS FROM INFUSION: 500.0000 mL | Freq: Once | INTRAVENOUS | Status: DC

## 2016-07-17 MED ORDER — BENZOCAINE-MENTHOL 20-0.5 % EX AERO
1.0000 | INHALATION_SPRAY | CUTANEOUS | Status: DC | PRN
Start: 2016-07-17 — End: 2016-07-18
  Filled 2016-07-17: qty 56

## 2016-07-17 MED ORDER — LIDOCAINE HCL (PF) 1 % IJ SOLN
30.0000 mL | INTRAMUSCULAR | Status: DC | PRN
Start: 2016-07-17 — End: 2016-07-17

## 2016-07-17 MED ORDER — COCONUT OIL OIL
1.0000 | TOPICAL_OIL | Status: DC | PRN
Start: 2016-07-17 — End: 2016-07-18

## 2016-07-17 MED ORDER — PRENATAL MULTIVITAMIN CH
1.0000 | ORAL_TABLET | Freq: Every day | ORAL | Status: DC
  Administered 2016-07-17 – 2016-07-18 (×2): 1 via ORAL
  Filled 2016-07-17 (×2): qty 1

## 2016-07-17 MED ORDER — CALCIUM CARBONATE ANTACID 500 MG PO CHEW
1.0000 | CHEWABLE_TABLET | Freq: Once | ORAL | Status: AC
  Administered 2016-07-17: 200 mg via ORAL

## 2016-07-17 NOTE — Anesthesia Preprocedure Evaluation (Signed)
Anesthesia Evaluation  Patient identified by MRN, date of birth, ID band Patient awake    Reviewed: Allergy & Precautions, NPO status , Patient's Chart, lab work & pertinent test results  Airway        Dental   Pulmonary former smoker,           Cardiovascular      Neuro/Psych    GI/Hepatic GERD  ,  Endo/Other    Renal/GU      Musculoskeletal   Abdominal   Peds  Hematology   Anesthesia Other Findings   Reproductive/Obstetrics (+) Pregnancy                             Anesthesia Physical Anesthesia Plan Anesthesia Quick Evaluation

## 2016-07-17 NOTE — OB Triage Note (Signed)
Patient came in for observation for labor evaluation. Patient reports regular uterine contractions every five minutes. Patient denies leaking of fluid but denies vaginal bleeding and spotting. Vital signs stable and patient afebrile. FHR baseline 130 with moderate variability with no accelerations and no decelerations. Family at bedside. Will continue to monitor.

## 2016-07-17 NOTE — Clinical Social Work Maternal (Signed)
  CLINICAL SOCIAL WORK MATERNAL/CHILD NOTE  Patient Details  Name: Carolyn Cox MRN: [id number] Date of Birth: 02-20-1986  Date:  07/17/2016  Clinical Social Worker Initiating Note:  [name] MSW,LCSW Date/ Time Initiated:  07/17/16/      Child's Name:      Legal Guardian:  Mother   Need for Interpreter:  None   Date of Referral:  07/17/16     Reason for Referral:  Other (Comment) (basic necessity needs)   Referral Source:  RN   Address:     Phone number:      Household Members:  Minor Children, Self   Natural Supports (not living in the home):  Friends, Immediate Family   Professional Supports: None   Employment: Full-time   Type of Work:     Education:  Associate Professor Resources:  Medicaid   Other Resources:  [facility]   Cultural/Religious Considerations Which May Impact Care:  none  Strengths:  Other (Comment) (Patient demonstrates that she has a good support system and cares about her child's well being)   Risk Factors/Current Problems:  Basic Needs    Cognitive State:  Alert , Linear Thinking , Goal Oriented    Mood/Affect:  Calm    CSW Assessment: CSW was consulted due to patient's responses to admission questions and wanting more assistance with clothing and food for newborn. CSW me with patient who had multiple visitors and multiple young children in the room. Patient wished for her visitors to stay in her room while CSW conducted assessment. Patient stated that her baby shower was going to be in a week or so and that she didn't have a lot of supplies because she was hoping to get them in the baby shower. She then stated that she thought she had a car seat but cannot get in touch with the person who told her they would bring it to her. Patient was wanting CSW to get her these items. CSW informed patient that she needed to continue to find a car seat for her newborn and that for clothing she could check into Electrical engineer. CSW  also encouraged her to tap into the support system that she stated she has. Patient is already receiving WIC and will be able to utilize them as a food resource for her newborn. Patient reported that in the home will be her, her 19 year old daughter, and her newborn. Patient was working full time and intends to return to work after her maternity leave. She has no concerns regarding transportation.  CSW Plan/Description:  Information/Referral to [employer], [place] 07/17/2016, 2:38 PM

## 2016-07-17 NOTE — Plan of Care (Signed)
Pt up to void. Voided QS.Marland KitchenPericare performed well per pt. Pt ready for transfer to [facility] via [facility] in stable condition with her family to 336. Report will be given there. Ellison Carwin RNC

## 2016-07-17 NOTE — H&P (Signed)
OB History & Physical   History of Present Illness:  Chief Complaint: contractions  HPI:  Carolyn Cox is a 30 y.o. G2P1001 female at [redacted]w[redacted]d dated by LMP.  Her pregnancy has been complicated by hyperemesis, limited prenatal care due to incarceration.    She reports contractions.   She denies leakage of fluid.   She denies vaginal bleeding.   She reports fetal movement.    Maternal Medical History:  History reviewed. No pertinent past medical history.  Past Surgical History:  Procedure Laterality Date  . MANDIBLE FRACTURE SURGERY      No Known Allergies  Prior to Admission medications   Medication Sig Start Date End Date Taking? Authorizing Provider  Doxylamine-Pyridoxine (DICLEGIS) 10-10 MG TBEC Two tablets at bedtime on day 1 and 2; if symptoms persist, take 1 tablet in morning and 2 tablets at bedtime on day 3; if symptoms persist, may increase to 1 tablet in morning, 1 tablet mid-afternoon, and 2 tablets at bedtime on day 4 (maximum: doxylamine 40 mg/pyridoxine 40 mg (4 tablets) per day). Patient not taking: Reported on 07/17/2016 01/09/16   Loleta Rose, MD  ondansetron [facility]) 4 MG tablet Take 1-2 tabs by mouth every 8 hours as needed for nausea/vomiting Patient not taking: Reported on 07/17/2016 01/09/16   Loleta Rose, MD  Prenatal Vit-Fe Fumarate-FA (PRENATAL VITAMINS PLUS) 27-1 MG TABS Take 1 tablet by mouth every morning. 01/05/16   Joni Reining, PA-C  promethazine (PHENERGAN) 25 MG tablet Take 1 tablet (25 mg total) by mouth every 6 (six) hours as needed for nausea or vomiting. Patient not taking: Reported on 07/17/2016 01/05/16   Joni Reining, PA-C    OB History  Gravida Para Term Preterm AB Living  2 1 1     1   SAB TAB Ectopic Multiple Live Births               # Outcome Date GA Lbr Len/2nd Weight Sex Delivery Anes PTL Lv  2 Current           1 Term               Prenatal care site: [facility]  Social History: She  reports that she has quit smoking. She  has never used smokeless tobacco. She reports that she does not drink alcohol or use drugs.  Family History: family history is not on file.   Review of Systems: Negative x 10 systems reviewed except as noted in the HPI.    Physical Exam:  Vital Signs: BP (!) 120/92 (BP Location: Left Arm)   Pulse (!) 105   Temp 98.1 F (36.7 C) (Oral)   Resp 19   Ht 5\' 6"  (1.676 m)   Wt 168 lb (76.2 kg)   LMP 10/23/2015   BMI 27.12 kg/m  General: no acute distress.  HEENT: normocephalic, atraumatic Heart: regular rate & rhythm.  No murmurs/rubs/gallops Lungs: clear to auscultation bilaterally Abdomen: soft, gravid, non-tender;  EFW: 7 pounds Pelvic:   External: Normal external female genitalia  Cervix: Dilation: 4.5 / Effacement (%): 60 / Station: -2   Extremities: non-tender, symmetric, no edema bilaterally.  DTRs: 2+  Neurologic: Alert & oriented x 3.    Pertinent Results:  Prenatal Labs: Blood type/Rh O positive  Antibody screen negative  Rubella Immune  Varicella Immune    RPR Non reactive  HBsAg negative  HIV negative  GC negative  Chlamydia negative  Genetic screening NT screen wnl- no other screening  1  hour GTT Not done  3 hour GTT NA  GBS negative on 9/25   Baseline FHR: 135 beats/min   Variability: moderate   Accelerations: present   Decelerations: few variables present Contractions: present frequency: 3-4 Overall assessment: Category I    Assessment:  Carolyn Cox is a 30 y.o. 902P1001 female at 3280w4d with cervical change.   Plan:  1. Admit to Labor & Delivery  2. CBC, T&S, Clrs, IVF 3. GBS negative.   4. Fetal well-being: Category I 5. Expectant management for vaginal delivery   Tresea MallGLEDHILL,Miquan Tandon, CNM 07/17/2016 6:23 AM

## 2016-07-17 NOTE — Discharge Summary (Signed)
OB Discharge Summary     Patient Name: Carolyn Cox DOB: [birth year] MRN: [id number]  Date of admission: 07/17/2016 Delivering Provider: Letitia Libra, MD  Date of Delivery: 07/18/2016  Date of discharge: 07/18/2016  Admitting diagnosis: G2P1 at [redacted]w[redacted]d with labor contractions      Secondary diagnosis: hyperemesis, limited care due to incarceration     Discharge diagnosis: Term Pregnancy Delivered                                                                                                Post partum procedures:none  Augmentation: none  Complications: None  Hospital course:  Onset of Labor With Vaginal Delivery   30 y.o. yo G2P2001 at [redacted]w[redacted]d was admitted in Active Labor on 07/17/2016.  Patient had an uncomplicated labor course as follows:  Membrane Rupture Time/Date: 7:15 AM ,07/17/2016   Patient had a delivery of a Viable female 07/17/2016.   Pateint had an uncomplicated postpartum course.   She is ambulating, tolerating a regular diet, passing flatus, and urinating well.  Patient is discharged home in stable condition on Wed.   Physical exam  Vitals:   07/17/16 2025 07/17/16 2347 07/18/16 0458 07/18/16 0743  BP: 121/64 112/69 125/71 128/77  Pulse: 80 93 71 79  Resp: 18 18 18 19   Temp: 98.8 F (37.1 C) 98.6 F (37 C) 98.3 F (36.8 C) 97.8 F (36.6 C)  TempSrc: Oral Oral Oral Oral  SpO2: 100% 99%  99%  Weight:      Height:       General: alert, cooperative and no distress Lochia: appropriate Uterine Fundus: firm Incision: N/A DVT Evaluation: No evidence of DVT seen on physical exam.  Labs: Lab Results  Component Value Date   WBC 15.3 (H) 07/18/2016   HGB 10.6 (L) 07/18/2016   HCT 32.1 (L) 07/18/2016   MCV 83.9 07/18/2016   PLT 155 07/18/2016   CMP Latest Ref Rng & Units 01/08/2016  Glucose 65 - 99 mg/dL 962(X)  BUN 6 - 20 mg/dL 8  Creatinine 5.28 - 4.13 mg/dL 2.44(W)  Sodium 102 - 725 mmol/L 135  Potassium 3.5 - 5.1 mmol/L 3.6  Chloride 101 -  111 mmol/L 103  CO2 22 - 32 mmol/L 25  Calcium 8.9 - 10.3 mg/dL 9.4  Total Protein 6.5 - 8.1 g/dL 7.5  Total Bilirubin 0.3 - 1.2 mg/dL 3.6(U)  Alkaline Phos 38 - 126 U/L 56  AST 15 - 41 U/L 22  ALT 14 - 54 U/L 14    Discharge instruction: per After Visit Summary.  Medications:    Medication List    STOP taking these medications   Doxylamine-Pyridoxine 10-10 MG Tbec Commonly known as:  DICLEGIS   ondansetron 4 MG tablet Commonly known as:  ZOFRAN   promethazine 25 MG tablet Commonly known as:  PHENERGAN     TAKE these medications   ibuprofen 600 MG tablet Commonly known as:  ADVIL,MOTRIN Take 1 tablet (600 mg total) by mouth every 6 (six) hours.   PRENATAL VITAMINS PLUS 27-1 MG Tabs Take 1 tablet by mouth every morning.  Diet: routine diet  Activity: Advance as tolerated. Pelvic rest for 6 weeks.   Outpatient follow up: Follow-up Information    GLEDHILL,JANE, CNM. Schedule an appointment as soon as possible for a visit in 6 week(s).   Specialty:  Obstetrics Contact information: [address] [place] [address] [phone]             Postpartum contraception: IUD Mirena Rhogam Given postpartum: NA Rubella vaccine given postpartum: Rubella Immune Varicella vaccine given postpartum: Varicella Immune  Baby Feeding: Bottle  Disposition:home with mother  SIGNED:  Letitia LibraRobert Paul Rhilee Currin, MD 07/18/2016 9:44 AM

## 2016-07-18 LAB — CBC
HCT: 32.1 % — ABNORMAL LOW (ref 35.0–47.0)
HEMOGLOBIN: 10.6 g/dL — AB (ref 12.0–16.0)
MCH: 27.6 pg (ref 26.0–34.0)
MCHC: 32.9 g/dL (ref 32.0–36.0)
MCV: 83.9 fL (ref 80.0–100.0)
PLATELETS: 155 10*3/uL (ref 150–440)
RBC: 3.83 MIL/uL (ref 3.80–5.20)
RDW: 13 % (ref 11.5–14.5)
WBC: 15.3 10*3/uL — ABNORMAL HIGH (ref 3.6–11.0)

## 2016-07-18 LAB — RPR: RPR Ser Ql: NONREACTIVE

## 2016-07-18 MED ORDER — IBUPROFEN 600 MG PO TABS: 600.0000 mg | ORAL_TABLET | Freq: Four times a day (QID) | ORAL | 0 refills | Status: DC

## 2016-07-18 NOTE — Progress Notes (Signed)
Patient discharge to home via wheelchair with spouse and baby in car seat.  

## 2016-07-18 NOTE — Discharge Instructions (Signed)

## 2016-07-18 NOTE — Progress Notes (Signed)
Admit Date: 07/17/2016 Today's Date: 07/18/2016  Post Partum Day 1  Subjective:  no complaints, up ad lib, voiding and tolerating PO  Objective: Temp:  [97.8 F (36.6 C)-98.9 F (37.2 C)] 97.8 F (36.6 C) (10/11 0743) Pulse Rate:  [71-93] 79 (10/11 0743) Resp:  [16-19] 19 (10/11 0743) BP: (111-145)/(64-80) 128/77 (10/11 0743) SpO2:  [99 %-100 %] 99 % (10/11 0743)  Physical Exam:  General: alert, cooperative and no distress Lochia: appropriate Uterine Fundus: firm Incision: none DVT Evaluation: No evidence of DVT seen on physical exam.   Recent Labs  07/17/16 0629 07/18/16 0435  HGB 11.3* 10.6*  HCT 33.3* 32.1*    Assessment/Plan: Discharge home, Bottle Feeding and Infant doing well   LOS: 1 day   Letitia Libra [facility] 07/18/2016, 9:45 AM

## 2016-07-19 ENCOUNTER — Encounter: Payer: Self-pay | Admitting: Emergency Medicine

## 2016-07-19 ENCOUNTER — Emergency Department
Admission: EM | Admit: 2016-07-19 | Discharge: 2016-07-19 | Disposition: A | Discharge status: Home / Self Care | Payer: Medicaid Other | Attending: Emergency Medicine | Admitting: Emergency Medicine

## 2016-07-19 DIAGNOSIS — R1030 Lower abdominal pain, unspecified: Secondary | ICD-10-CM | POA: Diagnosis present

## 2016-07-19 DIAGNOSIS — N3001 Acute cystitis with hematuria: Secondary | ICD-10-CM

## 2016-07-19 DIAGNOSIS — B9789 Other viral agents as the cause of diseases classified elsewhere: Secondary | ICD-10-CM | POA: Diagnosis not present

## 2016-07-19 DIAGNOSIS — Z87891 Personal history of nicotine dependence: Secondary | ICD-10-CM | POA: Insufficient documentation

## 2016-07-19 DIAGNOSIS — O8622 Infection of bladder following delivery: Secondary | ICD-10-CM | POA: Diagnosis not present

## 2016-07-19 DIAGNOSIS — R102 Pelvic and perineal pain: Secondary | ICD-10-CM

## 2016-07-19 LAB — URINALYSIS COMPLETE WITH MICROSCOPIC (ARMC ONLY)
BILIRUBIN URINE: NEGATIVE
Bacteria, UA: NONE SEEN
Glucose, UA: NEGATIVE mg/dL
KETONES UR: NEGATIVE mg/dL
Nitrite: NEGATIVE
PH: 8 (ref 5.0–8.0)
PROTEIN: 30 mg/dL — AB
Specific Gravity, Urine: 1.01 (ref 1.005–1.030)

## 2016-07-19 MED ORDER — HYDROCODONE-ACETAMINOPHEN 5-325 MG PO TABS
1.0000 | ORAL_TABLET | Freq: Once | ORAL | Status: AC
  Administered 2016-07-19: 1 via ORAL
  Filled 2016-07-19: qty 1

## 2016-07-19 MED ORDER — CEPHALEXIN 500 MG PO CAPS
500.0000 mg | ORAL_CAPSULE | Freq: Once | ORAL | Status: AC
  Administered 2016-07-19: 500 mg via ORAL
  Filled 2016-07-19 (×2): qty 1

## 2016-07-19 MED ORDER — HYDROCODONE-ACETAMINOPHEN 5-325 MG PO TABS: 1.0000 | ORAL_TABLET | Freq: Four times a day (QID) | ORAL | 0 refills | Status: DC | PRN

## 2016-07-19 MED ORDER — CEPHALEXIN 500 MG PO CAPS
500.0000 mg | ORAL_CAPSULE | Freq: Three times a day (TID) | ORAL | 0 refills | Status: AC
Start: 2016-07-19 — End: 2016-07-29

## 2016-07-19 NOTE — ED Triage Notes (Addendum)
Patient presents to the ED with lower abdominal and lower back pain 2 days post-partum.  Patient states she has had the pain since delivery but that she has been able to take norco and percocet while she was in the hospital that took care of the pain.  Patient states she was discharged with ibuprofen but it is not relieving her pain.  Patient ambulatory to triage.  Respirations even and not labored.  No obvious distress at this time.  Patient denies diarrhea and vomiting, denies fever, denies headache.

## 2016-07-19 NOTE — Discharge Summary (Signed)
See final progress note. 

## 2016-07-19 NOTE — ED Provider Notes (Signed)
[facility] Emergency Department Provider Note  ____________________________________________   First MD Initiated Contact with Patient 07/19/16 1404     (approximate)  I have reviewed the triage vital signs and the nursing notes.   HISTORY  Chief Complaint Abdominal Pain and Back Pain   HPI Carolyn Cox is a 30 y.o. female who is a G2 P2 who is 3 days postpartum who is presenting with lower abdominal cramping. She says the pain is a 9 out of 10. She says it is intermittent. She says that she had a vaginal delivery on October 10 which happened "very quickly." She says that she did not have time to have an epidural but there were 2 attempts made that were unsuccessful. She is also having pain to the lower back in the midline around the sites of these needlesticks. She says that the pain is a cramping quality and feels like a contraction. She is still having some mild bleeding but with reduction in the amount of bleeding. Says that she also has some burning externally when she urinates. Denies having Foley catheter. This was a vaginal delivery.  Patient says that she is not breast-feeding.   History reviewed. No pertinent past medical history.  Patient Active Problem List   Diagnosis Date Noted  . Indication for care in labor and delivery, antepartum 07/17/2016  . Labor and delivery indication for care or intervention 07/17/2016  . Abdominal pain affecting pregnancy 07/13/2016  . Irregular uterine contractions 07/08/2016    Past Surgical History:  Procedure Laterality Date  . MANDIBLE FRACTURE SURGERY      Prior to Admission medications   Medication Sig Start Date End Date Taking? Authorizing Provider  ibuprofen (ADVIL,MOTRIN) 600 MG tablet Take 1 tablet (600 mg total) by mouth every 6 (six) hours. 07/18/16   Nadara Mustardobert P Harris, MD  Prenatal Vit-Fe Fumarate-FA (PRENATAL VITAMINS PLUS) 27-1 MG TABS Take 1 tablet by mouth every morning. 01/05/16   Joni Reiningonald K  Smith, PA-C    Allergies Review of patient's allergies indicates no known allergies.  No family history on file.  Social History Social History  Substance Use Topics  . Smoking status: Former Games developermoker  . Smokeless tobacco: Never Used  . Alcohol use No    Review of Systems Constitutional: No fever/chills Eyes: No visual changes. ENT: No sore throat. Cardiovascular: Denies chest pain. Respiratory: Denies shortness of breath. Gastrointestinal:No nausea, no vomiting.  No diarrhea.  No constipation. Genitourinary: Negative for dysuria. Musculoskeletal:as above Skin: Negative for rash. Neurological: Negative for headaches, focal weakness or numbness.  10-point ROS otherwise negative.  ____________________________________________   PHYSICAL EXAM:  VITAL SIGNS: ED Triage Vitals  Enc Vitals Group     BP 07/19/16 1239 120/78     Pulse Rate 07/19/16 1239 86     Resp 07/19/16 1239 18     Temp 07/19/16 1239 98.2 F (36.8 C)     Temp Source 07/19/16 1239 Oral     SpO2 07/19/16 1239 100 %     Weight 07/19/16 1240 154 lb 3.2 oz (69.9 kg)     Height 07/19/16 1240 5' 6.5" (1.689 m)     Head Circumference --      Peak Flow --      Pain Score 07/19/16 1241 10     Pain Loc --      Pain Edu? --      Excl. in GC? --     Constitutional: Alert and oriented. Well appearing and in no  acute distress. Eyes: Conjunctivae are normal. PERRL. EOMI. Head: Atraumatic. Nose: No congestion/rhinnorhea. Mouth/Throat: Mucous membranes are moist.   Neck: No stridor.   Cardiovascular: Normal rate, regular rhythm. Grossly normal heart sounds.   Respiratory: Normal respiratory effort.  No retractions. Lungs CTAB. Gastrointestinal: Soft With mild suprapubic tenderness to palpation.No abdominal bruits. No CVA tenderness. Musculoskeletal: No lower extremity tenderness nor edema.  No joint effusions. Midline low lumbar pinpoint needlesticks without any surrounding erythema, induration. There is no pus  expressed and no tenderness to palpation. Neurologic:  Normal speech and language. No gross focal neurologic deficits are appreciated. No gait instability. Skin:  Skin is warm, dry and intact. No rash noted. Psychiatric: Mood and affect are normal. Speech and behavior are normal.  ____________________________________________   LABS (all labs ordered are listed, but only abnormal results are displayed)  Labs Reviewed  URINALYSIS COMPLETEWITH MICROSCOPIC ([facility] ONLY) - Abnormal; Notable for the following:       Result Value   Color, Urine YELLOW (*)    APPearance CLEAR (*)    Hgb urine dipstick 3+ (*)    Protein, ur 30 (*)    Leukocytes, UA 2+ (*)    Squamous Epithelial / LPF 0-5 (*)    All other components within normal limits  URINE CULTURE   ____________________________________________  EKG   ____________________________________________  RADIOLOGY   ____________________________________________   PROCEDURES  Procedure(s) performed:   Procedures  Critical Care performed:   ____________________________________________   INITIAL IMPRESSION / ASSESSMENT AND PLAN / ED COURSE  Pertinent labs & imaging results that were available during my care of the patient were reviewed by me and considered in my medical decision making (see chart for details).  Patient says that she only has ibuprofen at home for pain control. We'll discharge with Norco. Also evidence of UTI. We'll also give Keflex. History and physical consistent with uterine cramping status post vaginal delivery. Back pain consistent with trauma from needlesticks but do not see any sign of complication. Also the patient does not have any lower extremity neurologic deficits.  Clinical Course     ____________________________________________   FINAL CLINICAL IMPRESSION(S) / ED DIAGNOSES  UTI. Postpartum pain.    NEW MEDICATIONS STARTED DURING THIS VISIT:  New Prescriptions   No medications on file      Note:  This document was prepared using Dragon voice recognition software and may include unintentional dictation errors.    Myrna Blazer, MD 07/19/16 [id number]

## 2016-07-20 LAB — URINE CULTURE: Culture: NO GROWTH

## 2016-08-03 ENCOUNTER — Emergency Department
Admission: EM | Admit: 2016-08-03 | Discharge: 2016-08-03 | Disposition: A | Discharge status: Home / Self Care | Payer: Medicaid Other | Attending: Emergency Medicine | Admitting: Emergency Medicine

## 2016-08-03 ENCOUNTER — Encounter: Payer: Self-pay | Admitting: Urgent Care

## 2016-08-03 DIAGNOSIS — Z79899 Other long term (current) drug therapy: Secondary | ICD-10-CM | POA: Insufficient documentation

## 2016-08-03 DIAGNOSIS — Z87891 Personal history of nicotine dependence: Secondary | ICD-10-CM | POA: Insufficient documentation

## 2016-08-03 DIAGNOSIS — Z791 Long term (current) use of non-steroidal anti-inflammatories (NSAID): Secondary | ICD-10-CM | POA: Insufficient documentation

## 2016-08-03 DIAGNOSIS — N939 Abnormal uterine and vaginal bleeding, unspecified: Secondary | ICD-10-CM

## 2016-08-03 LAB — BASIC METABOLIC PANEL
ANION GAP: 7 (ref 5–15)
BUN: 6 mg/dL (ref 6–20)
CHLORIDE: 104 mmol/L (ref 101–111)
CO2: 28 mmol/L (ref 22–32)
Calcium: 9.3 mg/dL (ref 8.9–10.3)
Creatinine, Ser: 0.42 mg/dL — ABNORMAL LOW (ref 0.44–1.00)
Glucose, Bld: 99 mg/dL (ref 65–99)
POTASSIUM: 3.6 mmol/L (ref 3.5–5.1)
SODIUM: 139 mmol/L (ref 135–145)

## 2016-08-03 LAB — CBC
HCT: 40 % (ref 35.0–47.0)
HEMOGLOBIN: 13.2 g/dL (ref 12.0–16.0)
MCH: 27.7 pg (ref 26.0–34.0)
MCHC: 33.1 g/dL (ref 32.0–36.0)
MCV: 83.6 fL (ref 80.0–100.0)
PLATELETS: 210 10*3/uL (ref 150–440)
RBC: 4.78 MIL/uL (ref 3.80–5.20)
RDW: 13.5 % (ref 11.5–14.5)
WBC: 7.5 10*3/uL (ref 3.6–11.0)

## 2016-08-03 MED ORDER — KETOROLAC TROMETHAMINE 30 MG/ML IJ SOLN
30.0000 mg | Freq: Once | INTRAMUSCULAR | Status: AC
  Administered 2016-08-03: 30 mg via INTRAMUSCULAR
  Filled 2016-08-03: qty 1

## 2016-08-03 NOTE — ED Notes (Signed)
MD at bedside. 

## 2016-08-03 NOTE — ED Notes (Signed)
Pt states she is cramping very bad with no relief by motrin and wants a prescription for the pain. Pt states she went to her OBGYN for testing and is still waiting on the results.

## 2016-08-03 NOTE — Discharge Instructions (Signed)
Please follow up with [facility] as scheduled in 2 days. Your blood counts today were normal. Continue IBuprofen for pain

## 2016-08-03 NOTE — ED Triage Notes (Signed)
Patient presents to the ED tonight with c/o lower back pain, severe abdominal cramping, and heavy vaginal bleeding. Patient reports that she is 17 days post partum. Saw [facility] in follow up this past Wednesday and had a repeat transvaginal ultrasound; advised that there was not retained placenta/POC. Patient has to follow up on Monday. Presents today reporting that she is in worse pain and soaking 2-3 pads every hour.

## 2016-08-03 NOTE — ED Provider Notes (Signed)
[facility] Emergency Department Provider Note   ____________________________________________    I have reviewed the triage vital signs and the nursing notes.   HISTORY  Chief Complaint Abdominal Pain and Vaginal Bleeding     HPI Carolyn Cox is a 30 y.o. female who is 17 days postpartum who presents with complaints of vaginal bleeding and pelvic cramping. Recently saw her OB/GYN and had an ultrasound which was unremarkable and negative for retained products/placenta. She denies fevers or chills. No dysuria. She complains of intermittent moderate to severe cramps in her lower pelvis. No nausea or vomiting.   History reviewed. No pertinent past medical history.  Patient Active Problem List   Diagnosis Date Noted  . Indication for care in labor and delivery, antepartum 07/17/2016  . Labor and delivery indication for care or intervention 07/17/2016  . Abdominal pain affecting pregnancy 07/13/2016  . Irregular uterine contractions 07/08/2016    Past Surgical History:  Procedure Laterality Date  . MANDIBLE FRACTURE SURGERY      Prior to Admission medications   Medication Sig Start Date End Date Taking? Authorizing Provider  HYDROcodone-acetaminophen (NORCO/VICODIN) 5-325 MG tablet Take 1-2 tablets by mouth every 6 (six) hours as needed for moderate pain or severe pain. 07/19/16 07/19/17  Myrna Blazer, MD  ibuprofen (ADVIL,MOTRIN) 600 MG tablet Take 1 tablet (600 mg total) by mouth every 6 (six) hours. 07/18/16   Nadara Mustard, MD  Prenatal Vit-Fe Fumarate-FA (PRENATAL VITAMINS PLUS) 27-1 MG TABS Take 1 tablet by mouth every morning. 01/05/16   Joni Reining, PA-C     Allergies Review of patient's allergies indicates no known allergies.  No family history on file.  Social History Social History  Substance Use Topics  . Smoking status: Former Games developer  . Smokeless tobacco: Never Used  . Alcohol use No    Review of  Systems  Constitutional: No fever/chills  Cardiovascular: Denies chest pain. Respiratory: Denies shortness of breath. Gastrointestinal:  No nausea, no vomiting.   Genitourinary: Negative for dysuria.Bleeding as above Musculoskeletal: Negative for back pain. Skin: Negative for rash. Neurological: Negative for headaches   10-point ROS otherwise negative.  ____________________________________________   PHYSICAL EXAM:  VITAL SIGNS: ED Triage Vitals  Enc Vitals Group     BP 08/03/16 2001 (!) 134/103     Pulse Rate 08/03/16 2001 82     Resp 08/03/16 2001 14     Temp 08/03/16 2001 98.6 F (37 C)     Temp Source 08/03/16 2001 Oral     SpO2 08/03/16 2001 100 %     Weight 08/03/16 2002 154 lb (69.9 kg)     Height 08/03/16 2002 5' 6.5" (1.689 m)     Head Circumference --      Peak Flow --      Pain Score 08/03/16 2002 9     Pain Loc --      Pain Edu? --      Excl. in GC? --     Constitutional: Alert and oriented. No acute distress. Pleasant and interactive Eyes: Conjunctivae are normal.   Nose: No congestion/rhinnorhea. Mouth/Throat: Mucous membranes are moist.    Cardiovascular: Normal rate, regular rhythm. Grossly normal heart sounds.  Good peripheral circulation. Respiratory: Normal respiratory effort.  No retractions. Lungs CTAB. Gastrointestinal: Soft and nontender. No distention.  No CVA tenderness. Genitourinary: deferred Musculoskeletal: No lower extremity tenderness nor edema.  Warm and well perfused Neurologic:  Normal speech and language. No gross focal neurologic  deficits are appreciated.  Skin:  Skin is warm, dry and intact. No rash noted. Psychiatric: Mood and affect are normal. Speech and behavior are normal.  ____________________________________________   LABS (all labs ordered are listed, but only abnormal results are displayed)  Labs Reviewed  BASIC METABOLIC PANEL - Abnormal; Notable for the following:       Result Value   Creatinine, Ser 0.42 (*)     All other components within normal limits  CBC   ____________________________________________  EKG  None ____________________________________________  RADIOLOGY  None ____________________________________________   PROCEDURES  Procedure(s) performed: No    Critical Care performed: No ____________________________________________   INITIAL IMPRESSION / ASSESSMENT AND PLAN / ED COURSE  Pertinent labs & imaging results that were available during my care of the patient were reviewed by me and considered in my medical decision making (see chart for details).  Patient presents with lower pelvic cramping just been going on for the last 17 days. Discussed with Dr. Chauncey CruelStabler of OB/GYN feels this is related to placental involution and recommends continued treatment with ibuprofen and the other imaging at this time. Patient has follow-up with OB/GYN in 2 days. Her hemoglobin is normal.  Clinical Course   ____________________________________________   FINAL CLINICAL IMPRESSION(S) / ED DIAGNOSES  Final diagnoses:  Vaginal bleeding      NEW MEDICATIONS STARTED DURING THIS VISIT:  Discharge Medication List as of 08/03/2016  9:29 PM       Note:  This document was prepared using Dragon voice recognition software and may include unintentional dictation errors.    Jene Everyobert Ranjit Ashurst, MD 08/03/16 [phone]

## 2016-08-04 ENCOUNTER — Ambulatory Visit
Admission: EM | Admit: 2016-08-04 | Discharge: 2016-08-04 | Disposition: A | Discharge status: Home / Self Care | Payer: Medicaid Other | Attending: Family Medicine | Admitting: Family Medicine

## 2016-08-04 DIAGNOSIS — K0889 Other specified disorders of teeth and supporting structures: Secondary | ICD-10-CM | POA: Diagnosis not present

## 2016-08-04 DIAGNOSIS — K047 Periapical abscess without sinus: Secondary | ICD-10-CM

## 2016-08-04 MED ORDER — KETOROLAC TROMETHAMINE 60 MG/2ML IM SOLN
60.0000 mg | Freq: Once | INTRAMUSCULAR | Status: AC
  Administered 2016-08-04: 60 mg via INTRAMUSCULAR

## 2016-08-04 MED ORDER — AMOXICILLIN-POT CLAVULANATE 875-125 MG PO TABS: 1.0000 | ORAL_TABLET | Freq: Two times a day (BID) | ORAL | 0 refills | Status: DC

## 2016-08-04 MED ORDER — MELOXICAM 15 MG PO TABS: 15.0000 mg | ORAL_TABLET | Freq: Every day | ORAL | 0 refills | Status: DC

## 2016-08-04 NOTE — ED Provider Notes (Addendum)
[facility]    CSN: [id number] Arrival date & time: 08/04/16  1141     History   Chief Complaint Chief Complaint  Patient presents with  . Dental Pain    onset today as per patient abcess has appointment with dentist in 2 weeks.    HPI Carolyn Cox is a 30 y.o. female.   Patient is here because of oral pain and tooth pain. She states she's had dull abscesses before she is developing new dull abscess on the right lower tooth. She is also postpartum and she's had a recent baby. She had had some Percocet for that but states that was several weeks ago. She had dull abscesses before but doesn't have an appointment see a dentist for 2 weeks.   The history is provided by the patient. No language interpreter was used.  Dental Pain  Location:  Lower Quality:  Aching and constant Severity:  Severe Onset quality:  Sudden Timing:  Constant Progression:  Worsening Context: abscess and poor dentition   Context: not crown fracture, not dental caries, not dental fracture and not malocclusion   Relieved by:  Nothing Ineffective treatments:  NSAIDs Associated symptoms: facial pain, facial swelling and oral lesions   Associated symptoms: no congestion, no difficulty swallowing, no drooling, no fever, no gum swelling, no headaches, no neck pain, no neck swelling and no trismus     History reviewed. No pertinent past medical history.  Patient Active Problem List   Diagnosis Date Noted  . Indication for care in labor and delivery, antepartum 07/17/2016  . Labor and delivery indication for care or intervention 07/17/2016  . Abdominal pain affecting pregnancy 07/13/2016  . Irregular uterine contractions 07/08/2016    Past Surgical History:  Procedure Laterality Date  . MANDIBLE FRACTURE SURGERY      OB History    Gravida Para Term Preterm AB Living   2 2 2     1    SAB TAB Ectopic Multiple Live Births         0         Home Medications    Prior to Admission  medications   Medication Sig Start Date End Date Taking? Authorizing Provider  amoxicillin-clavulanate (AUGMENTIN) 875-125 MG tablet Take 1 tablet by mouth 2 (two) times daily. 08/04/16   Hassan Rowan, MD  HYDROcodone-acetaminophen (NORCO/VICODIN) 5-325 MG tablet Take 1-2 tablets by mouth every 6 (six) hours as needed for moderate pain or severe pain. 07/19/16 07/19/17  Myrna Blazer, MD  ibuprofen (ADVIL,MOTRIN) 600 MG tablet Take 1 tablet (600 mg total) by mouth every 6 (six) hours. 07/18/16   Nadara Mustard, MD  meloxicam (MOBIC) 15 MG tablet Take 1 tablet (15 mg total) by mouth daily. 08/04/16   Hassan Rowan, MD  Prenatal Vit-Fe Fumarate-FA (PRENATAL VITAMINS PLUS) 27-1 MG TABS Take 1 tablet by mouth every morning. 01/05/16   Joni Reining, PA-C    Family History No family history on file.  Social History Social History  Substance Use Topics  . Smoking status: Former Games developer  . Smokeless tobacco: Never Used  . Alcohol use No     Allergies   Review of patient's allergies indicates no known allergies.   Review of Systems Review of Systems  Constitutional: Negative for fever.  HENT: Positive for facial swelling and mouth sores. Negative for congestion and drooling.   Musculoskeletal: Negative for neck pain.  Neurological: Negative for headaches.  All other systems reviewed and are negative.  Physical Exam Triage Vital Signs ED Triage Vitals  Enc Vitals Group     BP 08/04/16 1211 135/88     Pulse Rate 08/04/16 1211 94     Resp 08/04/16 1211 16     Temp 08/04/16 1211 98.2 F (36.8 C)     Temp Source 08/04/16 1211 Oral     SpO2 08/04/16 1211 100 %     Weight 08/04/16 1215 148 lb (67.1 kg)     Height 08/04/16 1215 5' 6.5" (1.689 m)     Head Circumference --      Peak Flow --      Pain Score 08/04/16 1216 8     Pain Loc --      Pain Edu? --      Excl. in GC? --    No data found.   Updated Vital Signs BP 135/88 (BP Location: Left Arm)   Pulse 94    Temp 98.2 F (36.8 C) (Oral)   Resp 16   Ht 5' 6.5" (1.689 m)   Wt 148 lb (67.1 kg)   LMP 10/23/2015   SpO2 100%   BMI 23.53 kg/m   Visual Acuity Right Eye Distance:   Left Eye Distance:   Bilateral Distance:    Right Eye Near:   Left Eye Near:    Bilateral Near:     Physical Exam  Constitutional: She is oriented to person, place, and time. She appears well-developed and well-nourished.  HENT:  Head: Normocephalic and atraumatic.  Right Ear: Hearing, tympanic membrane, external ear and ear canal normal.  Left Ear: Hearing, tympanic membrane, external ear and ear canal normal.  Mouth/Throat: Mucous membranes are normal. No oral lesions. Abnormal dentition. Dental abscesses present. No uvula swelling or dental caries. No posterior oropharyngeal erythema.    Eyes: Conjunctivae and lids are normal. Pupils are equal, round, and reactive to light.  Neck: Normal range of motion.  Pulmonary/Chest: Effort normal.  Musculoskeletal: Normal range of motion.  Neurological: She is alert and oriented to person, place, and time.  Skin: Skin is warm and dry. No rash noted. No erythema.  Psychiatric: She has a normal mood and affect.  Vitals reviewed.    UC Treatments / Results  Labs (all labs ordered are listed, but only abnormal results are displayed) Labs Reviewed - No data to display  EKG  EKG Interpretation None       Radiology No results found.  Procedures Procedures (including critical care time)  Medications Ordered in UC Medications  ketorolac (TORADOL) injection 60 mg (not administered)     Initial Impression / Assessment and Plan / UC Course  I have reviewed the triage vital signs and the nursing notes.  Pertinent labs & imaging results that were available during my care of the patient were reviewed by me and considered in my medical decision making (see chart for details).  Clinical Course    Patient due to the bowel pain she is having will be given 60  Toradol IM. Unfortunately when her history with hold the [place] data reporting site she has gotten 60 Percocet tablets and a split prescription on October 19 and October 24. This being October 28 4 days later I'm not going to be able to prescribe any more narcotics at this time for her. She also be noted that she received on 07/19/2014 hydrocodone tablets as well. In reviewing her ER chart she has a visit for opiate withdrawal. Hopefully the Mobic and Augmentin and Toradol injection  will help with the pain until she can either see herr PCP or dentis.t  Final Clinical Impressions(s) / UC Diagnoses   Final diagnoses:  Dental abscess  Pain, dental    New Prescriptions New Prescriptions   AMOXICILLIN-CLAVULANATE (AUGMENTIN) 875-125 MG TABLET    Take 1 tablet by mouth 2 (two) times daily.   MELOXICAM (MOBIC) 15 MG TABLET    Take 1 tablet (15 mg total) by mouth daily.     Hassan Rowan, MD 08/04/16 1347    Hassan Rowan, MD 08/04/16 [phone]

## 2016-10-13 ENCOUNTER — Ambulatory Visit
Admission: EM | Admit: 2016-10-13 | Discharge: 2016-10-13 | Disposition: A | Discharge status: Home / Self Care | Payer: Medicaid Other | Attending: Family Medicine | Admitting: Family Medicine

## 2016-10-13 ENCOUNTER — Encounter: Payer: Self-pay | Admitting: Emergency Medicine

## 2016-10-13 ENCOUNTER — Ambulatory Visit: Payer: Medicaid Other

## 2016-10-13 DIAGNOSIS — S40012A Contusion of left shoulder, initial encounter: Secondary | ICD-10-CM | POA: Diagnosis not present

## 2016-10-13 DIAGNOSIS — S20212A Contusion of left front wall of thorax, initial encounter: Secondary | ICD-10-CM

## 2016-10-13 DIAGNOSIS — Z043 Encounter for examination and observation following other accident: Secondary | ICD-10-CM | POA: Diagnosis not present

## 2016-10-13 DIAGNOSIS — W19XXXA Unspecified fall, initial encounter: Secondary | ICD-10-CM

## 2016-10-13 DIAGNOSIS — R52 Pain, unspecified: Secondary | ICD-10-CM | POA: Insufficient documentation

## 2016-10-13 MED ORDER — IBUPROFEN 800 MG PO TABS
800.0000 mg | ORAL_TABLET | Freq: Once | ORAL | Status: AC
  Administered 2016-10-13: 400 mg via ORAL

## 2016-10-13 MED ORDER — HYDROCODONE-ACETAMINOPHEN 5-325 MG PO TABS: ORAL_TABLET | ORAL | 0 refills | Status: DC

## 2016-10-13 NOTE — ED Triage Notes (Signed)
Patient complains of recent fall at the movies last night. Patient states that she fell down the steps. Patient complains of left rib pain, left arm pain that radiates up ito shoulder.

## 2017-01-07 ENCOUNTER — Telehealth: Payer: Self-pay

## 2017-01-07 NOTE — Telephone Encounter (Signed)
Pt states she is experiencing a lot of pain and cramping while on her cycle. She had some post partum complications and c/o dysmennorrhea since having her baby in October. Pt requested appt for today but due to no availability on the schedule advised to go to urgent care. Pt is not currently on any birth control and requested to schedule an appt for mirena. Transferred to front desk for scheduling.

## 2017-01-22 ENCOUNTER — Ambulatory Visit: Payer: Self-pay | Admitting: Obstetrics and Gynecology

## 2017-01-29 NOTE — ED Provider Notes (Signed)
[facility]    CSN: [id number] Arrival date & time: 10/13/16  1358     History   Chief Complaint Chief Complaint  Patient presents with  . Fall    HPI Carolyn Cox is a 31 y.o. female.   31 yo female with a  c/o left chest wall and left shoulder pain after fall last night. Patient states she fell down some steps while at the movies last night.     The history is provided by the patient.  Fall     History reviewed. No pertinent past medical history.  Patient Active Problem List   Diagnosis Date Noted  . Indication for care in labor and delivery, antepartum 07/17/2016  . Labor and delivery indication for care or intervention 07/17/2016  . Abdominal pain affecting pregnancy 07/13/2016  . Irregular uterine contractions 07/08/2016    Past Surgical History:  Procedure Laterality Date  . MANDIBLE FRACTURE SURGERY      OB History    Gravida Para Term Preterm AB Living   2 2 2     1    SAB TAB Ectopic Multiple Live Births         0         Home Medications    Prior to Admission medications   Medication Sig Start Date End Date Taking? Authorizing Provider  amoxicillin-clavulanate (AUGMENTIN) 875-125 MG tablet Take 1 tablet by mouth 2 (two) times daily. 08/04/16   Hassan Rowan, MD  HYDROcodone-acetaminophen (NORCO/VICODIN) 5-325 MG tablet 1-2 tabs po q 8 hours prn 10/13/16   Payton Mccallum, MD  ibuprofen (ADVIL,MOTRIN) 600 MG tablet Take 1 tablet (600 mg total) by mouth every 6 (six) hours. 07/18/16   Nadara Mustard, MD  meloxicam (MOBIC) 15 MG tablet Take 1 tablet (15 mg total) by mouth daily. 08/04/16   Hassan Rowan, MD  Prenatal Vit-Fe Fumarate-FA (PRENATAL VITAMINS PLUS) 27-1 MG TABS Take 1 tablet by mouth every morning. 01/05/16   Joni Reining, PA-C    Family History History reviewed. No pertinent family history.  Social History Social History  Substance Use Topics  . Smoking status: Former Games developer  . Smokeless tobacco: Never Used  . Alcohol use  No     Allergies   Patient has no known allergies.   Review of Systems Review of Systems   Physical Exam Triage Vital Signs ED Triage Vitals  Enc Vitals Group     BP 10/13/16 1600 125/72     Pulse Rate 10/13/16 1600 76     Resp 10/13/16 1600 17     Temp 10/13/16 1600 99 F (37.2 C)     Temp Source 10/13/16 1600 Oral     SpO2 10/13/16 1600 100 %     Weight 10/13/16 1559 134 lb (60.8 kg)     Height 10/13/16 1559 5' 6.5" (1.689 m)     Head Circumference --      Peak Flow --      Pain Score 10/13/16 1601 10     Pain Loc --      Pain Edu? --      Excl. in GC? --    No data found.   Updated Vital Signs BP 125/72 (BP Location: Left Arm)   Pulse 76   Temp 99 F (37.2 C) (Oral)   Resp 17   Ht 5' 6.5" (1.689 m)   Wt 134 lb (60.8 kg)   LMP 10/13/2016   SpO2 100%   BMI 21.30 kg/m  Visual Acuity Right Eye Distance:   Left Eye Distance:   Bilateral Distance:    Right Eye Near:   Left Eye Near:    Bilateral Near:     Physical Exam  Constitutional: She appears well-developed and well-nourished.  HENT:  Head: Normocephalic and atraumatic.  Cardiovascular: Normal rate, regular rhythm, normal heart sounds and intact distal pulses.   Pulmonary/Chest: Effort normal and breath sounds normal. No respiratory distress. She has no wheezes. She has no rales. She exhibits tenderness (left upper chest area to palpation).  Musculoskeletal:       Left shoulder: She exhibits decreased range of motion, tenderness and bony tenderness. She exhibits no swelling, no effusion, no crepitus, no deformity, no laceration, no pain, no spasm, normal pulse and normal strength.  Skin: She is not diaphoretic.  Nursing note and vitals reviewed.    UC Treatments / Results  Labs (all labs ordered are listed, but only abnormal results are displayed) Labs Reviewed - No data to display  EKG  EKG Interpretation None       Radiology No results found.  Procedures Procedures (including  critical care time)  Medications Ordered in UC Medications  ibuprofen (ADVIL,MOTRIN) tablet 800 mg (400 mg Oral Given 10/13/16 1629)     Initial Impression / Assessment and Plan / UC Course  I have reviewed the triage vital signs and the nursing notes.  Pertinent labs & imaging results that were available during my care of the patient were reviewed by me and considered in my medical decision making (see chart for details).       Final Clinical Impressions(s) / UC Diagnoses   Final diagnoses:  Fall, initial encounter  Contusion of left shoulder, initial encounter  Contusion of left chest wall, initial encounter    New Prescriptions Discharge Medication List as of 10/13/2016  5:45 PM     1. x-ray results and diagnosis reviewed with patient 2. rx as per orders above; reviewed possible side effects, interactions, risks and benefits; rx given for a few vicodin tablets 3. Recommend supportive treatment with otc analgesics prn, rest, ice 4. Follow-up prn if symptoms worsen or don't improve   Payton Mccallum, MD 01/29/17 1331

## 2017-02-07 ENCOUNTER — Emergency Department
Admission: EM | Admit: 2017-02-07 | Discharge: 2017-02-07 | Disposition: A | Discharge status: Home / Self Care | Payer: Medicaid Other | Attending: Emergency Medicine | Admitting: Emergency Medicine

## 2017-02-07 ENCOUNTER — Encounter: Payer: Self-pay | Admitting: *Deleted

## 2017-02-07 ENCOUNTER — Encounter: Payer: Self-pay | Admitting: Emergency Medicine

## 2017-02-07 DIAGNOSIS — M5432 Sciatica, left side: Secondary | ICD-10-CM | POA: Insufficient documentation

## 2017-02-07 DIAGNOSIS — Z87891 Personal history of nicotine dependence: Secondary | ICD-10-CM | POA: Insufficient documentation

## 2017-02-07 DIAGNOSIS — M545 Low back pain: Secondary | ICD-10-CM | POA: Diagnosis present

## 2017-02-07 MED ORDER — OXYCODONE-ACETAMINOPHEN 5-325 MG PO TABS
1.0000 | ORAL_TABLET | Freq: Once | ORAL | Status: AC
  Administered 2017-02-07: 1 via ORAL
  Filled 2017-02-07: qty 1

## 2017-02-07 MED ORDER — KETOROLAC TROMETHAMINE 60 MG/2ML IM SOLN
60.0000 mg | Freq: Once | INTRAMUSCULAR | Status: AC
  Administered 2017-02-07: 60 mg via INTRAMUSCULAR

## 2017-02-07 MED ORDER — CYCLOBENZAPRINE HCL 10 MG PO TABS: 10.0000 mg | ORAL_TABLET | Freq: Three times a day (TID) | ORAL | 0 refills | Status: DC | PRN

## 2017-02-07 MED ORDER — PREDNISONE 10 MG (21) PO TBPK: ORAL_TABLET | ORAL | 0 refills | Status: DC

## 2017-02-07 MED ORDER — DIAZEPAM 5 MG PO TABS
5.0000 mg | ORAL_TABLET | Freq: Once | ORAL | Status: AC
  Administered 2017-02-07: 5 mg via ORAL

## 2017-02-07 MED ORDER — KETOROLAC TROMETHAMINE 60 MG/2ML IM SOLN
INTRAMUSCULAR | Status: AC
  Filled 2017-02-07: qty 2

## 2017-02-07 MED ORDER — LIDOCAINE 5 % EX PTCH
1.0000 | MEDICATED_PATCH | CUTANEOUS | Status: DC
  Administered 2017-02-07: 1 via TRANSDERMAL

## 2017-02-07 MED ORDER — DIAZEPAM 5 MG PO TABS
ORAL_TABLET | ORAL | Status: AC
  Filled 2017-02-07: qty 1

## 2017-02-07 MED ORDER — METHYLPREDNISOLONE SODIUM SUCC 125 MG IJ SOLR
125.0000 mg | Freq: Once | INTRAMUSCULAR | Status: AC
Start: 2017-02-07 — End: 2017-02-07
  Administered 2017-02-07: 125 mg via INTRAVENOUS
  Filled 2017-02-07: qty 2

## 2017-02-07 MED ORDER — LIDOCAINE 5 % EX PTCH
MEDICATED_PATCH | CUTANEOUS | Status: AC
  Filled 2017-02-07: qty 1

## 2017-02-07 NOTE — ED Notes (Signed)
Pt to ED lobby via w/c, appears uncomfortable, grimacing; brought in by EMS; EMS reports pt seen this morning for back pain, rx flexeril without relief

## 2017-02-07 NOTE — ED Notes (Signed)
Pt states started having upper back pain 2 days ago and states tonight about 0200 became severe to mid lower back radiating to left buttocks. Worse when she walks and moves, no hx of back problems in the past.

## 2017-02-07 NOTE — ED Notes (Signed)
Pt states pain is slightly better, 9/10

## 2017-02-07 NOTE — ED Triage Notes (Signed)
Pt brought in by ems for mid lower back pain with radiation to left buttocks. Worse when she moves or walks, no hx of back pain.

## 2017-02-07 NOTE — ED Triage Notes (Signed)
Pt brought in via EMS for lower left sided back pain that radiates into left glute.x 2 days. Pt reports pain is constant. No fevers reported or changes in urine. No diarrhea, nausea or vomiting. Pt denies numbness and has no decreased movement.

## 2017-02-07 NOTE — ED Notes (Signed)
Pt states "my back is killing me". She said it has been hurting for 3 days. She woke up and it was just in pain. She said came to ED at 5:00am this morning and was prescribed "Flexerol". She states that it did not work. Pt came back to the ED.

## 2017-02-07 NOTE — ED Provider Notes (Signed)
[facility] Emergency Department Provider Note  ____________________________________________  Time seen: Approximately 11:46 PM  I have reviewed the triage vital signs and the nursing notes.   HISTORY  Chief Complaint Back Pain    HPI Carolyn Cox is a 31 y.o. female that presents to the emergency department with low left-sided back pain that radiating down her left legfor 3 days. Patient describes the pain as a shooting pain is worse with movement. Patient does not recall an injury. She was seen in the emergency department this morning and received Valium and Toradol for pain. She states that she felt better when she got home for a little bit but pain returned and is worse than it was before. She took a Flexeril but it did not help. She denies fever, shortness of breath, chest pain, nausea, vomiting, abdominal pain, diarrhea, constipation, dysuria, urgency, frequency, vaginal discharge, bowel or bladder dysfunction, saddle paresthesias.   History reviewed. No pertinent past medical history.  There are no active problems to display for this patient.   History reviewed. No pertinent surgical history.  Prior to Admission medications   Medication Sig Start Date End Date Taking? Authorizing Provider  cyclobenzaprine (FLEXERIL) 10 MG tablet Take 1 tablet (10 mg total) by mouth 3 (three) times daily as needed. 02/07/17   Darci Current, MD  predniSONE (STERAPRED UNI-PAK 21 TAB) 10 MG (21) TBPK tablet Take 6 tablets on day 1, take 5 tablets on day 2, take 4 tablets on day 3, take 3 tablets on day 4, take 2 tablets on day 5, take 1 tablet on day 6 02/07/17   Enid Derry, PA-C    Allergies Patient has no known allergies.  History reviewed. No pertinent family history.  Social History Social History  Substance Use Topics  . Smoking status: Former Games developer  . Smokeless tobacco: Never Used  . Alcohol use No     Review of Systems  Constitutional: No  fever/chills Cardiovascular: No chest pain. Respiratory: No cough. No SOB. Gastrointestinal: No abdominal pain.  No nausea, no vomiting.  Musculoskeletal: Positive for back pain. Skin: Negative for rash, abrasions, lacerations, ecchymosis. Neurological: Negative for headaches, numbness or tingling   ____________________________________________   PHYSICAL EXAM:  VITAL SIGNS: ED Triage Vitals  Enc Vitals Group     BP 02/07/17 2010 115/79     Pulse Rate 02/07/17 2010 (!) 118     Resp 02/07/17 2010 16     Temp 02/07/17 2010 99.2 F (37.3 C)     Temp Source 02/07/17 2010 Oral     SpO2 02/07/17 2010 100 %     Weight 02/07/17 2011 129 lb (58.5 kg)     Height 02/07/17 2011 5\' 6"  (1.676 m)     Head Circumference --      Peak Flow --      Pain Score 02/07/17 2010 10     Pain Loc --      Pain Edu? --      Excl. in GC? --      Constitutional: Alert and oriented. Well appearing and in no acute distress. Eyes: Conjunctivae are normal. PERRL. EOMI. Head: Atraumatic. ENT:      Ears:      Nose: No congestion/rhinnorhea.      Mouth/Throat: Mucous membranes are moist.  Neck: No stridor.  Cardiovascular: Normal rate, regular rhythm.  Good peripheral circulation. Respiratory: Normal respiratory effort without tachypnea or retractions. Lungs CTAB. Good air entry to the bases with no decreased or  absent breath sounds. Gastrointestinal: Bowel sounds 4 quadrants. Soft and nontender to palpation. No guarding or rigidity. No palpable masses. No distention.  Musculoskeletal: Full range of motion to all extremities. No gross deformities appreciated. Positive straight leg raise. Negative cross leg raise. No tenderness to palpation over lumbar spine. Tenderness to palpation over left SI joint. Neurologic:  Normal speech and language. No gross focal neurologic deficits are appreciated.  Skin:  Skin is warm, dry and intact. No rash noted.   ____________________________________________    LABS (all labs ordered are listed, but only abnormal results are displayed)  Labs Reviewed - No data to display ____________________________________________  EKG   ____________________________________________  RADIOLOGY  No results found.  ____________________________________________    PROCEDURES  Procedure(s) performed:    Procedures    Medications  oxyCODONE-acetaminophen (PERCOCET/ROXICET) 5-325 MG per tablet 1 tablet (not administered)  methylPREDNISolone sodium succinate (SOLU-MEDROL) 125 mg/2 mL injection 125 mg (125 mg Intravenous Given 02/07/17 2212)  oxyCODONE-acetaminophen (PERCOCET/ROXICET) 5-325 MG per tablet 1 tablet (1 tablet Oral Given 02/07/17 2212)     ____________________________________________   INITIAL IMPRESSION / ASSESSMENT AND PLAN / ED COURSE  Pertinent labs & imaging results that were available during my care of the patient were reviewed by me and considered in my medical decision making (see chart for details).  Review of the [place] CSRS was performed in accordance of the [facility] prior to dispensing any controlled drugs.   Patient's diagnosis is consistent with sciatica. Vital signs and exam are reassuring. She denies any trauma. No indication for imaging at this time. No urinary symptoms. Patient was given Percocet and Solu-Medrol in ED. Patient will be discharged home with prescriptions for prednisone. Patient is to follow up with PCP as directed. Patient is given ED precautions to return to the ED for any worsening or new symptoms.   ____________________________________________  FINAL CLINICAL IMPRESSION(S) / ED DIAGNOSES  Final diagnoses:  Sciatica of left side      NEW MEDICATIONS STARTED DURING THIS VISIT:  New Prescriptions   PREDNISONE (STERAPRED UNI-PAK 21 TAB) 10 MG (21) TBPK TABLET    Take 6 tablets on day 1, take 5 tablets on day 2, take 4 tablets on day 3, take 3 tablets on day 4, take 2 tablets on day 5, take 1 tablet on  day 6        This chart was dictated using voice recognition software/Dragon. Despite best efforts to proofread, errors can occur which can change the meaning. Any change was purely unintentional.    Enid Derry, PA-C 02/07/17 2351    Sharman Cheek, MD 02/11/17 2219

## 2017-02-07 NOTE — ED Provider Notes (Signed)
[facility] Emergency Department Provider Note    First MD Initiated Contact with Patient 02/07/17 [phone]     (approximate)  I have reviewed the triage vital signs and the nursing notes.   HISTORY  Chief Complaint Back Pain    HPI Carolyn Cox is a 31 y.o. female presents with nontraumatic left lower back pain with radiation into left buttocks. Patient states that the pain is worse with ambulation or any movement. Patient states current sharp pain score is 10 out of 10. Patient denies any urinary symptoms. Patient denies any bowel habit changes. Patient denies any Lotrimin weakness numbness or gait instability.   Past medical history IV cocaine use. There are no active problems to display for this patient.   Past surgical history None  Prior to Admission medications   Not on File    Allergies No known drug allergies  Family history Noncontributory  Social History Social History  Substance Use Topics  . Smoking status: Not on file  . Smokeless tobacco: Not on file  . Alcohol use Not on file    Review of Systems Constitutional: No fever/chills Eyes: No visual changes. ENT: No sore throat. Cardiovascular: Denies chest pain. Respiratory: Denies shortness of breath. Gastrointestinal: No abdominal pain.  No nausea, no vomiting.  No diarrhea.  No constipation. Genitourinary: Negative for dysuria. Musculoskeletal: Positive for back pain. Integumentary: Negative for rash. Neurological: Negative for headaches, focal weakness or numbness.   ____________________________________________   PHYSICAL EXAM:  VITAL SIGNS: ED Triage Vitals  Enc Vitals Group     BP 02/07/17 0558 135/76     Pulse Rate 02/07/17 0558 99     Resp 02/07/17 0558 18     Temp 02/07/17 0558 98.5 F (36.9 C)     Temp src --      SpO2 02/07/17 0558 100 %     Weight 02/07/17 0558 129 lb (58.5 kg)     Height 02/07/17 0558 5\' 6"  (1.676 m)     Head Circumference --        Peak Flow --      Pain Score 02/07/17 0557 10     Pain Loc --      Pain Edu? --      Excl. in GC? --     Constitutional: Alert and oriented. Well appearing and in no acute distress. Eyes: Conjunctivae are normal. PERRL. EOMI. Head: Atraumatic. Mouth/Throat: Mucous membranes are moist Neck: No stridor. No cervical spine tenderness to palpation. Cardiovascular: Normal rate, regular rhythm. Good peripheral circulation. Grossly normal heart sounds. Respiratory: Normal respiratory effort.  No retractions. Lungs CTAB. Gastrointestinal: Soft and nontender. No distention.  Musculoskeletal: No lower extremity tenderness nor edema. No gross deformities of extremities. Pain left lumbar paraspinal muscle palpation. Positive straight leg raise Neurologic:  Normal speech and language. No gross focal neurologic deficits are appreciated.  Skin:  Skin is warm, dry and intact. No rash noted. Psychiatric: Mood and affect are normal. Speech and behavior are normal.   Procedures   ____________________________________________   INITIAL IMPRESSION / ASSESSMENT AND PLAN / ED COURSE  Pertinent labs & imaging results that were available during my care of the patient were reviewed by me and considered in my medical decision making (see chart for details).   31 year old female presenting with nontraumatic left lower back pain. Patient given Valium by mouth and IV Toradol with improvement of pain. She'll be referred to orthopedic surgeon for outpatient evaluation and management  ____________________________________________  FINAL CLINICAL IMPRESSION(S) / ED DIAGNOSES  Final diagnoses:  Sciatica of left side     MEDICATIONS GIVEN DURING THIS VISIT:  Medications  diazepam (VALIUM) 5 MG tablet (not administered)  ketorolac (TORADOL) 60 MG/2ML injection (not administered)  ketorolac (TORADOL) injection 60 mg (60 mg Intramuscular Given 02/07/17 0609)  diazepam (VALIUM) tablet 5 mg (5 mg Oral  Given 02/07/17 0609)     NEW OUTPATIENT MEDICATIONS STARTED DURING THIS VISIT:  New Prescriptions   No medications on file    Modified Medications   No medications on file    Discontinued Medications   No medications on file     Note:  This document was prepared using Dragon voice recognition software and may include unintentional dictation errors.    Darci Current, MD 02/08/17 [phone]

## 2017-02-08 ENCOUNTER — Encounter: Payer: Self-pay | Admitting: Emergency Medicine

## 2017-02-25 ENCOUNTER — Ambulatory Visit
Admission: EM | Admit: 2017-02-25 | Discharge: 2017-02-25 | Disposition: A | Discharge status: Home / Self Care | Payer: Medicaid Other | Attending: Family Medicine | Admitting: Family Medicine

## 2017-02-25 DIAGNOSIS — M5432 Sciatica, left side: Secondary | ICD-10-CM | POA: Diagnosis not present

## 2017-02-25 MED ORDER — HYDROCODONE-ACETAMINOPHEN 5-325 MG PO TABS: ORAL_TABLET | ORAL | 0 refills | Status: DC

## 2017-02-25 MED ORDER — PREDNISONE 20 MG PO TABS: ORAL_TABLET | ORAL | 0 refills | Status: DC

## 2017-02-25 MED ORDER — METAXALONE 800 MG PO TABS: 800.0000 mg | ORAL_TABLET | Freq: Three times a day (TID) | ORAL | 0 refills | Status: DC

## 2017-02-25 NOTE — Discharge Instructions (Signed)
Follow up with orthopedist and/or pcp

## 2017-02-25 NOTE — ED Triage Notes (Signed)
Pt is here because she was dx with sciatica, she was seen and treated with muscle relaxer's and steroids, but the pain is severe.

## 2017-02-25 NOTE — ED Provider Notes (Signed)
[facility]    CSN: [id number] Arrival date & time: 02/25/17  1246     History   Chief Complaint Chief Complaint  Patient presents with  . Sciatica    HPI Carolyn Cox is a 31 y.o. female.   The history is provided by the patient.  Back Pain  Location:  Lumbar spine Quality:  Shooting Radiates to:  L posterior upper leg and L knee Pain severity:  Severe Pain is:  Same all the time Onset quality:  Sudden Duration:  18 days Timing:  Constant Progression:  Worsening (states was referred to an orthopedist, was seen, sent for MRI  and is now waiting on results) Chronicity:  New Context: not emotional stress, not falling, not jumping from heights, not lifting heavy objects, not MCA, not MVA, not occupational injury, not pedestrian accident, not physical stress, not recent illness, not recent injury and not twisting   Relieved by:  Nothing Ineffective treatments:  Muscle relaxants, OTC medications and NSAIDs Associated symptoms: no abdominal pain, no abdominal swelling, no bladder incontinence, no bowel incontinence, no chest pain, no dysuria, no fever, no headaches, no leg pain, no numbness, no paresthesias, no pelvic pain, no perianal numbness, no tingling, no weakness and no weight loss   Risk factors: no hx of cancer, no hx of osteoporosis, no lack of exercise, no menopause, not obese, not pregnant, no recent surgery, no steroid use and no vascular disease     History reviewed. No pertinent past medical history.  Patient Active Problem List   Diagnosis Date Noted  . Indication for care in labor and delivery, antepartum 07/17/2016  . Labor and delivery indication for care or intervention 07/17/2016  . Abdominal pain affecting pregnancy 07/13/2016  . Irregular uterine contractions 07/08/2016    Past Surgical History:  Procedure Laterality Date  . MANDIBLE FRACTURE SURGERY      OB History    Gravida Para Term Preterm AB Living   2 2 2  0 0 1   SAB TAB  Ectopic Multiple Live Births   0 0 0   1       Home Medications    Prior to Admission medications   Medication Sig Start Date End Date Taking? Authorizing Provider  amoxicillin-clavulanate (AUGMENTIN) 875-125 MG tablet Take 1 tablet by mouth 2 (two) times daily. 08/04/16   Hassan Rowan, MD  cyclobenzaprine (FLEXERIL) 10 MG tablet Take 1 tablet (10 mg total) by mouth 3 (three) times daily as needed. 02/07/17   Darci Current, MD  HYDROcodone-acetaminophen (NORCO/VICODIN) 5-325 MG tablet 1-2 tabs po qd prn 02/25/17   Payton Mccallum, MD  ibuprofen (ADVIL,MOTRIN) 600 MG tablet Take 1 tablet (600 mg total) by mouth every 6 (six) hours. 07/18/16   Nadara Mustard, MD  meloxicam (MOBIC) 15 MG tablet Take 1 tablet (15 mg total) by mouth daily. 08/04/16   Hassan Rowan, MD  metaxalone (SKELAXIN) 800 MG tablet Take 1 tablet (800 mg total) by mouth 3 (three) times daily. prn 02/25/17   Payton Mccallum, MD  predniSONE (DELTASONE) 20 MG tablet 3 tabs po qd for 2 days, then 2 tabs po qd for 3 days, then 1 tab po qd for 3 days, then half a tab po qd for 2 days 02/25/17   Payton Mccallum, MD  Prenatal Vit-Fe Fumarate-FA (PRENATAL VITAMINS PLUS) 27-1 MG TABS Take 1 tablet by mouth every morning. 01/05/16   Joni Reining, PA-C    Family History History reviewed. No pertinent family history.  Social History Social History  Substance Use Topics  . Smoking status: Former Games developer  . Smokeless tobacco: Never Used  . Alcohol use No     Allergies   Patient has no known allergies.   Review of Systems Review of Systems  Constitutional: Negative for fever and weight loss.  Cardiovascular: Negative for chest pain.  Gastrointestinal: Negative for abdominal pain and bowel incontinence.  Genitourinary: Negative for bladder incontinence, dysuria and pelvic pain.  Musculoskeletal: Positive for back pain.  Neurological: Negative for tingling, weakness, numbness, headaches and paresthesias.     Physical  Exam Triage Vital Signs ED Triage Vitals  Enc Vitals Group     BP 02/25/17 1427 122/72     Pulse Rate 02/25/17 1427 (!) 106     Resp 02/25/17 1427 18     Temp 02/25/17 1427 98 F (36.7 C)     Temp Source 02/25/17 1427 Oral     SpO2 02/25/17 1427 100 %     Weight 02/25/17 1428 129 lb (58.5 kg)     Height 02/25/17 1428 5\' 6"  (1.676 m)     Head Circumference --      Peak Flow --      Pain Score 02/25/17 1428 10     Pain Loc --      Pain Edu? --      Excl. in GC? --    No data found.   Updated Vital Signs BP 122/72 (BP Location: Left Arm)   Pulse (!) 106   Temp 98 F (36.7 C) (Oral)   Resp 18   Ht 5\' 6"  (1.676 m)   Wt 129 lb (58.5 kg)   LMP 02/04/2017   SpO2 100%   BMI 20.82 kg/m   Visual Acuity Right Eye Distance:   Left Eye Distance:   Bilateral Distance:    Right Eye Near:   Left Eye Near:    Bilateral Near:     Physical Exam  Constitutional: She appears well-developed and well-nourished. No distress.  Musculoskeletal: She exhibits tenderness. She exhibits no edema.       Lumbar back: She exhibits tenderness and spasm. She exhibits normal range of motion, no bony tenderness, no swelling, no edema, no deformity, no laceration, no pain and normal pulse.  Neurological: She is alert. She has normal reflexes. She displays normal reflexes. She exhibits normal muscle tone.  Skin: Skin is warm and dry. No rash noted. She is not diaphoretic. No erythema.  Nursing note and vitals reviewed.    UC Treatments / Results  Labs (all labs ordered are listed, but only abnormal results are displayed) Labs Reviewed - No data to display  EKG  EKG Interpretation None       Radiology No results found.  Procedures Procedures (including critical care time)  Medications Ordered in UC Medications - No data to display   Initial Impression / Assessment and Plan / UC Course  I have reviewed the triage vital signs and the nursing notes.  Pertinent labs & imaging  results that were available during my care of the patient were reviewed by me and considered in my medical decision making (see chart for details).       Final Clinical Impressions(s) / UC Diagnoses   Final diagnoses:  Sciatica of left side    New Prescriptions Discharge Medication List as of 02/25/2017  3:17 PM    START taking these medications   Details  metaxalone (SKELAXIN) 800 MG tablet Take 1 tablet (800 mg  total) by mouth 3 (three) times daily. prn, Starting Mon 02/25/2017, Normal    predniSONE (DELTASONE) 20 MG tablet 3 tabs po qd for 2 days, then 2 tabs po qd for 3 days, then 1 tab po qd for 3 days, then half a tab po qd for 2 days, Normal       1. diagnosis reviewed with patient 2. rx as per orders above; reviewed possible side effects, interactions, risks and benefits  3. Follow up with orthopedist    Payton Mccallum, MD 02/25/17 1549

## 2017-03-01 ENCOUNTER — Emergency Department
Admission: EM | Admit: 2017-03-01 | Discharge: 2017-03-01 | Disposition: A | Discharge status: Home / Self Care | Payer: Medicaid Other | Attending: Student in an Organized Health Care Education/Training Program | Admitting: Student in an Organized Health Care Education/Training Program

## 2017-03-01 DIAGNOSIS — M5442 Lumbago with sciatica, left side: Secondary | ICD-10-CM | POA: Insufficient documentation

## 2017-03-01 DIAGNOSIS — Z791 Long term (current) use of non-steroidal anti-inflammatories (NSAID): Secondary | ICD-10-CM | POA: Diagnosis not present

## 2017-03-01 DIAGNOSIS — Z87891 Personal history of nicotine dependence: Secondary | ICD-10-CM | POA: Insufficient documentation

## 2017-03-01 DIAGNOSIS — M545 Low back pain: Secondary | ICD-10-CM | POA: Diagnosis present

## 2017-03-01 DIAGNOSIS — Z79899 Other long term (current) drug therapy: Secondary | ICD-10-CM | POA: Diagnosis not present

## 2017-03-01 MED ORDER — KETOROLAC TROMETHAMINE 30 MG/ML IJ SOLN
30.0000 mg | Freq: Once | INTRAMUSCULAR | Status: AC
  Administered 2017-03-01: 30 mg via INTRAMUSCULAR
  Filled 2017-03-01: qty 1

## 2017-03-01 MED ORDER — TRIAMCINOLONE ACETONIDE 40 MG/ML IJ SUSP
40.0000 mg | Freq: Once | INTRAMUSCULAR | Status: AC
Start: 2017-03-01 — End: 2017-03-01
  Administered 2017-03-01: 40 mg via INTRAMUSCULAR
  Filled 2017-03-01: qty 1

## 2017-03-01 MED ORDER — GABAPENTIN 300 MG PO CAPS: 300.0000 mg | ORAL_CAPSULE | Freq: Three times a day (TID) | ORAL | 0 refills | Status: DC

## 2017-03-01 MED ORDER — BUPIVACAINE HCL (PF) 0.25 % IJ SOLN
30.0000 mL | Freq: Once | INTRAMUSCULAR | Status: AC
  Administered 2017-03-01: 30 mL
  Filled 2017-03-01: qty 30

## 2017-03-01 NOTE — Discharge Instructions (Signed)

## 2017-03-01 NOTE — ED Triage Notes (Signed)
Pt in with co left lower back pain radiates into left buttocks and leg. Has been seen several times for the same since with a dx of sciatica. States here for persistent pain.

## 2017-03-01 NOTE — ED Provider Notes (Signed)
[facility] Emergency Department Provider Note    First MD Initiated Contact with Patient 03/01/17 [phone]     (approximate)  I have reviewed the triage vital signs and the nursing notes.   HISTORY  Chief Complaint Back Pain    HPI Carolyn Cox is a 31 y.o. female presents with several weeks of persistent left back and leg pain. States that this all started on May 3. Denies any fevers. No numbness or tingling. Denies worsening with movement. Wasn't seen in the ER and has been followed up with orthopedics and diagnosis sciatica. Had no improvement with prednisone. Has been trying over-the-counter medications without any improvement.Presents to the ER tonight due to 10/10 pain limiting her ability to walk. No trouble urinating or controlling her bowels. No loss of sensation in her groin area.   PMH:  Previously healthy No family history on file. Past Surgical History:  Procedure Laterality Date  . MANDIBLE FRACTURE SURGERY     Patient Active Problem List   Diagnosis Date Noted  . Indication for care in labor and delivery, antepartum 07/17/2016  . Labor and delivery indication for care or intervention 07/17/2016  . Abdominal pain affecting pregnancy 07/13/2016  . Irregular uterine contractions 07/08/2016      Prior to Admission medications   Medication Sig Start Date End Date Taking? Authorizing Provider  amoxicillin-clavulanate (AUGMENTIN) 875-125 MG tablet Take 1 tablet by mouth 2 (two) times daily. 08/04/16   Hassan Rowan, MD  cyclobenzaprine (FLEXERIL) 10 MG tablet Take 1 tablet (10 mg total) by mouth 3 (three) times daily as needed. 02/07/17   Darci Current, MD  HYDROcodone-acetaminophen (NORCO/VICODIN) 5-325 MG tablet 1-2 tabs po qd prn 02/25/17   Payton Mccallum, MD  ibuprofen (ADVIL,MOTRIN) 600 MG tablet Take 1 tablet (600 mg total) by mouth every 6 (six) hours. 07/18/16   Nadara Mustard, MD  meloxicam (MOBIC) 15 MG tablet Take 1 tablet (15 mg  total) by mouth daily. 08/04/16   Hassan Rowan, MD  metaxalone (SKELAXIN) 800 MG tablet Take 1 tablet (800 mg total) by mouth 3 (three) times daily. prn 02/25/17   Payton Mccallum, MD  predniSONE (DELTASONE) 20 MG tablet 3 tabs po qd for 2 days, then 2 tabs po qd for 3 days, then 1 tab po qd for 3 days, then half a tab po qd for 2 days 02/25/17   Payton Mccallum, MD  Prenatal Vit-Fe Fumarate-FA (PRENATAL VITAMINS PLUS) 27-1 MG TABS Take 1 tablet by mouth every morning. 01/05/16   Joni Reining, PA-C    Allergies Patient has no known allergies.    Social History Social History  Substance Use Topics  . Smoking status: Former Games developer  . Smokeless tobacco: Never Used  . Alcohol use No    Review of Systems Patient denies headaches, rhinorrhea, blurry vision, numbness, shortness of breath, chest pain, edema, cough, abdominal pain, nausea, vomiting, diarrhea, dysuria, fevers, rashes or hallucinations unless otherwise stated above in HPI. ____________________________________________   PHYSICAL EXAM:  VITAL SIGNS: Vitals:   03/01/17 0125  BP: 115/72  Pulse: 98  Resp: 18  Temp: 98.9 F (37.2 C)    Constitutional: Alert and oriented. Well appearing and in no acute distress. Eyes: Conjunctivae are normal. PERRL. EOMI. Head: Atraumatic. Nose: No congestion/rhinnorhea. Mouth/Throat: Mucous membranes are moist.  Oropharynx non-erythematous. Neck: No stridor. Painless ROM. No cervical spine tenderness to palpation Hematological/Lymphatic/Immunilogical: No cervical lymphadenopathy. Cardiovascular: Normal rate, regular rhythm. Grossly normal heart sounds.  Good peripheral circulation.  Respiratory: Normal respiratory effort.  No retractions. Lungs CTAB. Gastrointestinal: Soft and nontender. No distention. No abdominal bruits. No CVA tenderness. Musculoskeletal: No lower extremity tenderness nor edema.  No joint effusions.  2+ and equal DTR os patella and achilles, down going babinksi, 5/5  strenght bilaterally. Point ttp to supreior left gluteal muscle lateral to SI joint Neurologic:  Normal speech and language. No gross focal neurologic deficits are appreciated. No gait instability. Skin:  Skin is warm, dry and intact. No rash noted. Psychiatric: Mood and affect are normal. Speech and behavior are normal.  ____________________________________________   LABS (all labs ordered are listed, but only abnormal results are displayed)  No results found for this or any previous visit (from the past 24 hour(s)). ____________________________________________  ____________________________________________   PROCEDURES  Procedure(s) performed:  Procedures Procedure: Trigger point injection. The patient agreed to the procedure without reservation, and acknowledging the risks of infection, nerve damage, damage to internal organs, lack of efficacy, bleeding. The  was prepped with chloraprep and allowed to dry. A 10 cc syringe was loaded with 0.5% Bupivacaine and kenalog a 27 ga needle advanced toward the areas of discomfort The plunger was withdrawn before injection. The patient tolerated the procedure well.    Critical Care performed: no ____________________________________________   INITIAL IMPRESSION / ASSESSMENT AND PLAN / ED COURSE  Pertinent labs & imaging results that were available during my care of the patient were reviewed by me and considered in my medical decision making (see chart for details).  DDX: sciatica, spasm, fracture, unlikely SS, CE  Carolyn Cox is a 31 y.o. who presents to the ED with No history of injury or trauma. No recent back instrumentation/procedures. No fevers. Denies cord compression symptoms. No bowel/bladder incontinence or retention, no LE weakness. VSS in ED. Exam with no LE weakness bilat., no sensory deficits, normal DTRs, no clonus, no saddle anesthesia. Pain with palpation of back and with trunk movement. Likely MSK related pain, probable  lumbar strain. History and physical exam less consistent with kidney stone or pyelonephritis. Treatments will include observation, analgesia, and arrange appropriate follow up for recheck.  Clinical picture is not consistent with epidural abscess , fracture, or cauda equina syndrome. Plan supportive care, follow up for recheck       ____________________________________________   FINAL CLINICAL IMPRESSION(S) / ED DIAGNOSES  Final diagnoses:  Acute left-sided low back pain with left-sided sciatica      NEW MEDICATIONS STARTED DURING THIS VISIT:  New Prescriptions   No medications on file     Note:  This document was prepared using Dragon voice recognition software and may include unintentional dictation errors.    Willy Eddy, MD 03/01/17 [phone]

## 2017-04-09 ENCOUNTER — Emergency Department
Admission: EM | Admit: 2017-04-09 | Discharge: 2017-04-09 | Disposition: A | Discharge status: Home / Self Care | Payer: Medicaid Other | Attending: Emergency Medicine | Admitting: Emergency Medicine

## 2017-04-09 ENCOUNTER — Encounter: Payer: Self-pay | Admitting: Emergency Medicine

## 2017-04-09 DIAGNOSIS — M5432 Sciatica, left side: Secondary | ICD-10-CM | POA: Diagnosis not present

## 2017-04-09 DIAGNOSIS — Z79899 Other long term (current) drug therapy: Secondary | ICD-10-CM | POA: Insufficient documentation

## 2017-04-09 DIAGNOSIS — Z87891 Personal history of nicotine dependence: Secondary | ICD-10-CM | POA: Insufficient documentation

## 2017-04-09 DIAGNOSIS — M79605 Pain in left leg: Secondary | ICD-10-CM | POA: Diagnosis present

## 2017-04-09 MED ORDER — ORPHENADRINE CITRATE 30 MG/ML IJ SOLN
60.0000 mg | Freq: Two times a day (BID) | INTRAMUSCULAR | Status: DC
  Administered 2017-04-09: 60 mg via INTRAMUSCULAR
  Filled 2017-04-09: qty 2

## 2017-04-09 MED ORDER — LIDOCAINE 5 % EX PTCH
1.0000 | MEDICATED_PATCH | CUTANEOUS | Status: DC
  Administered 2017-04-09: 1 via TRANSDERMAL
  Filled 2017-04-09: qty 1

## 2017-04-09 MED ORDER — TRIAMCINOLONE ACETONIDE 40 MG/ML IJ SUSP
40.0000 mg | Freq: Once | INTRAMUSCULAR | Status: AC
  Administered 2017-04-09: 40 mg via INTRAMUSCULAR
  Filled 2017-04-09: qty 1

## 2017-04-09 MED ORDER — DIAZEPAM 2 MG PO TABS
2.0000 mg | ORAL_TABLET | Freq: Once | ORAL | Status: AC
  Administered 2017-04-09: 2 mg via ORAL
  Filled 2017-04-09: qty 1

## 2017-04-09 MED ORDER — BACLOFEN 10 MG PO TABS: 10.0000 mg | ORAL_TABLET | Freq: Every day | ORAL | 0 refills | Status: DC

## 2017-04-09 MED ORDER — BUPIVACAINE HCL (PF) 0.25 % IJ SOLN
5.0000 mL | Freq: Once | INTRAMUSCULAR | Status: AC
  Administered 2017-04-09: 5 mL
  Filled 2017-04-09: qty 30

## 2017-04-09 MED ORDER — KETOROLAC TROMETHAMINE 60 MG/2ML IM SOLN
30.0000 mg | Freq: Once | INTRAMUSCULAR | Status: AC
  Administered 2017-04-09: 30 mg via INTRAMUSCULAR
  Filled 2017-04-09: qty 2

## 2017-04-09 MED ORDER — PREDNISONE 10 MG PO TABS: ORAL_TABLET | ORAL | 0 refills | Status: DC

## 2017-04-09 MED ORDER — OXYCODONE-ACETAMINOPHEN 5-325 MG PO TABS
2.0000 | ORAL_TABLET | Freq: Once | ORAL | Status: AC
  Administered 2017-04-09: 2 via ORAL
  Filled 2017-04-09: qty 2

## 2017-04-09 NOTE — ED Provider Notes (Signed)
[facility] Emergency Department Provider Note  ____________________________________________  Time seen: Approximately 11:56 AM  I have reviewed the triage vital signs and the nursing notes.   HISTORY  Chief Complaint Leg Pain    HPI Carolyn Cox is a 31 y.o. female that presents to the emergency department with low back pain that radiates into the back of her left thigh for 2 months that worsened yesterday. Pain was originally more in her right buttocks but is now at the top of her right thigh. Patient states that this morning the pain was so bad that she could not get out of bed. This has happened before but has not happened for 3 weeks. She is having difficulty walking due to pain. She has been taking oxycodone that she was prescribed from orthopedics but they discontinued her pain medicine because she did not have a diagnosis. She states that she has tried every medication. Valium and Percocet have worked best in the past. Orthopedics recommended that she follow up with neurology. She has an appointment scheduled with neurology on June 26 and she is supposed to have an MRI. She has not tried physical therapy. She denies fever, shortness breath, chest pain, nausea, vomiting, abdominal pain, numbness, tingling, bowel or bladder dysfunction, weakness, calf pain.   History reviewed. No pertinent past medical history.  Patient Active Problem List   Diagnosis Date Noted  . Indication for care in labor and delivery, antepartum 07/17/2016  . Labor and delivery indication for care or intervention 07/17/2016  . Abdominal pain affecting pregnancy 07/13/2016  . Irregular uterine contractions 07/08/2016    Past Surgical History:  Procedure Laterality Date  . MANDIBLE FRACTURE SURGERY      Prior to Admission medications   Medication Sig Start Date End Date Taking? Authorizing Provider  amoxicillin-clavulanate (AUGMENTIN) 875-125 MG tablet Take 1 tablet by mouth 2  (two) times daily. 08/04/16   Hassan Rowan, MD  baclofen (LIORESAL) 10 MG tablet Take 1 tablet (10 mg total) by mouth daily. 04/09/17 04/09/18  Enid Derry, PA-C  cyclobenzaprine (FLEXERIL) 10 MG tablet Take 1 tablet (10 mg total) by mouth 3 (three) times daily as needed. 02/07/17   Darci Current, MD  gabapentin (NEURONTIN) 300 MG capsule Take 1 capsule (300 mg total) by mouth 3 (three) times daily. 03/01/17 03/01/18  Willy Eddy, MD  HYDROcodone-acetaminophen (NORCO/VICODIN) 5-325 MG tablet 1-2 tabs po qd prn 02/25/17   Payton Mccallum, MD  ibuprofen (ADVIL,MOTRIN) 600 MG tablet Take 1 tablet (600 mg total) by mouth every 6 (six) hours. 07/18/16   Nadara Mustard, MD  meloxicam (MOBIC) 15 MG tablet Take 1 tablet (15 mg total) by mouth daily. 08/04/16   Hassan Rowan, MD  metaxalone (SKELAXIN) 800 MG tablet Take 1 tablet (800 mg total) by mouth 3 (three) times daily. prn 02/25/17   Payton Mccallum, MD  predniSONE (DELTASONE) 10 MG tablet Take 6 tablets on day 1, take 5 tablets on day 2, take 4 tablets on day 3, take 3 tablets on day 4, take 2 tablets on day 5, take 1 tablet on day 6 04/09/17   Enid Derry, PA-C  Prenatal Vit-Fe Fumarate-FA (PRENATAL VITAMINS PLUS) 27-1 MG TABS Take 1 tablet by mouth every morning. 01/05/16   Joni Reining, PA-C    Allergies Patient has no known allergies.  No family history on file.  Social History Social History  Substance Use Topics  . Smoking status: Former Games developer  . Smokeless tobacco: Never Used  .  Alcohol use No     Review of Systems  Constitutional: No fever/chills Cardiovascular: No chest pain. Respiratory: No SOB. Gastrointestinal: No abdominal pain.  No nausea, no vomiting.  Musculoskeletal: Positive for back pain. Skin: Negative for rash, abrasions, lacerations, ecchymosis. Neurological: Negative for headaches, numbness or tingling   ____________________________________________   PHYSICAL EXAM:  VITAL SIGNS: ED Triage Vitals   Enc Vitals Group     BP 04/09/17 1141 124/85     Pulse Rate 04/09/17 1141 97     Resp 04/09/17 1141 20     Temp 04/09/17 1141 (!) 100.6 F (38.1 C)     Temp Source 04/09/17 1141 Oral     SpO2 04/09/17 1141 99 %     Weight 04/09/17 1141 142 lb (64.4 kg)     Height 04/09/17 1141 5\' 6"  (1.676 m)     Head Circumference --      Peak Flow --      Pain Score 04/09/17 1140 9     Pain Loc --      Pain Edu? --      Excl. in GC? --      Constitutional: Alert and oriented. Well appearing and in no acute distress. Eyes: Conjunctivae are normal. PERRL. EOMI. Head: Atraumatic. ENT:      Ears:      Nose: No congestion/rhinnorhea.      Mouth/Throat: Mucous membranes are moist.  Neck: No stridor.  Cardiovascular: Normal rate, regular rhythm.  Good peripheral circulation. Respiratory: Normal respiratory effort without tachypnea or retractions. Lungs CTAB. Good air entry to the bases with no decreased or absent breath sounds. Gastrointestinal: Bowel sounds 4 quadrants. Soft and nontender to palpation. No guarding or rigidity. No palpable masses. No distention.  Musculoskeletal: Full range of motion to all extremities. No gross deformities appreciated. Neurologic:  Normal speech and language. No gross focal neurologic deficits are appreciated. Diffuse tenderness to palpation across lower back. Tender to palpation over right hamstring with trigger points. Positive straight leg raise and cross leg raise. Skin:  Skin is warm, dry and intact. No rash noted.   ____________________________________________   LABS (all labs ordered are listed, but only abnormal results are displayed)  Labs Reviewed - No data to display ____________________________________________  EKG   ____________________________________________  RADIOLOGY   No results found.  ____________________________________________    PROCEDURES  Procedure(s) performed:    Procedures  The patient agreed to the procedure  without reservation, and acknowledging the risks of infection, nerve damage, damage to internal organs, lack of efficacy, bleeding. The  was prepped with chloraprep and allowed to dry. A 10 cc syringe was loaded with 0.5% Bupivacaine and kenalog a 27 ga needle advanced toward the areas of discomfort The plunger was withdrawn before injection. The patient tolerated the procedure well.   Medications  oxyCODONE-acetaminophen (PERCOCET/ROXICET) 5-325 MG per tablet 2 tablet (2 tablets Oral Given 04/09/17 1208)  diazepam (VALIUM) tablet 2 mg (2 mg Oral Given 04/09/17 1208)  ketorolac (TORADOL) injection 30 mg (30 mg Intramuscular Given 04/09/17 1336)  bupivacaine (PF) (MARCAINE) 0.25 % injection 5 mL (5 mLs Infiltration Given by Other 04/09/17 1514)  triamcinolone acetonide (KENALOG-40) injection 40 mg (40 mg Intramuscular Given by Other 04/09/17 1513)     ____________________________________________   INITIAL IMPRESSION / ASSESSMENT AND PLAN / ED COURSE  Pertinent labs & imaging results that were available during my care of the patient were reviewed by me and considered in my medical decision making (see chart for details).  Review of the [place] CSRS was performed in accordance of the [facility] prior to dispensing any controlled drugs.  Patient's diagnosis is consistent with sciatica. Vital signs and exam are reassuring. No bowel or bladder dysfunction or saddle paresthesias. After oral, Norflex, oxycodone patient stated that she received trigger point injections in the ER in May that made her feel better for several weeks. She thinks that this could get her to last until her neurology appointment. Patient felt better after trigger point injections into hamstring. Patient was walking after procedure. Patient will be discharged home with prescriptions for baclofen. Patient is to follow up with neurology as directed. Patient is given precautions to return to the ED for any worsening or new  symptoms.     ____________________________________________  FINAL CLINICAL IMPRESSION(S) / ED DIAGNOSES  Final diagnoses:  Sciatica of left side      NEW MEDICATIONS STARTED DURING THIS VISIT:  Discharge Medication List as of 04/09/2017  3:23 PM    START taking these medications   Details  baclofen (LIORESAL) 10 MG tablet Take 1 tablet (10 mg total) by mouth daily., Starting Tue 04/09/2017, Until Wed 04/09/2018, Print            This chart was dictated using voice recognition software/Dragon. Despite best efforts to proofread, errors can occur which can change the meaning. Any change was purely unintentional.    Enid Derry, PA-C 04/11/17 0825    Enid Derry, PA-C 04/11/17 1325    Governor Rooks, MD 04/11/17 [phone]

## 2017-04-09 NOTE — ED Triage Notes (Signed)
Brought in via ems from home  Presents wit pain to left inner thigh  States pain increases with movement and also having"cramping"type pain  Was seen at Ortho and had MRI  Was told that it was a neuro problem   Scheduled to f/u with neurologist this month

## 2017-04-15 ENCOUNTER — Other Ambulatory Visit: Payer: Self-pay | Admitting: Physical Medicine & Rehabilitation

## 2017-04-15 DIAGNOSIS — M545 Low back pain: Secondary | ICD-10-CM

## 2017-04-19 ENCOUNTER — Ambulatory Visit: Admission: RE | Admit: 2017-04-19 | Payer: Medicaid Other | Source: Ambulatory Visit

## 2017-05-13 ENCOUNTER — Emergency Department
Admission: EM | Admit: 2017-05-13 | Discharge: 2017-05-13 | Disposition: A | Discharge status: Home / Self Care | Payer: Medicaid Other | Attending: Emergency Medicine | Admitting: Emergency Medicine

## 2017-05-13 ENCOUNTER — Encounter: Payer: Self-pay | Admitting: Medical Oncology

## 2017-05-13 DIAGNOSIS — Z87891 Personal history of nicotine dependence: Secondary | ICD-10-CM | POA: Insufficient documentation

## 2017-05-13 DIAGNOSIS — Z791 Long term (current) use of non-steroidal anti-inflammatories (NSAID): Secondary | ICD-10-CM | POA: Diagnosis not present

## 2017-05-13 DIAGNOSIS — M62830 Muscle spasm of back: Secondary | ICD-10-CM | POA: Insufficient documentation

## 2017-05-13 DIAGNOSIS — Z79899 Other long term (current) drug therapy: Secondary | ICD-10-CM | POA: Diagnosis not present

## 2017-05-13 DIAGNOSIS — M546 Pain in thoracic spine: Secondary | ICD-10-CM | POA: Insufficient documentation

## 2017-05-13 DIAGNOSIS — M545 Low back pain: Secondary | ICD-10-CM | POA: Diagnosis present

## 2017-05-13 MED ORDER — PREDNISONE 10 MG (21) PO TBPK: ORAL_TABLET | ORAL | 0 refills | Status: DC

## 2017-05-13 MED ORDER — DEXAMETHASONE SODIUM PHOSPHATE 10 MG/ML IJ SOLN
10.0000 mg | Freq: Once | INTRAMUSCULAR | Status: AC
  Administered 2017-05-13: 10 mg via INTRAMUSCULAR
  Filled 2017-05-13: qty 1

## 2017-05-13 MED ORDER — OXYCODONE-ACETAMINOPHEN 5-325 MG PO TABS: 1.0000 | ORAL_TABLET | Freq: Three times a day (TID) | ORAL | 0 refills | Status: DC | PRN

## 2017-05-13 NOTE — ED Triage Notes (Signed)
Pt reports lower back pain that has been since may 3rd and increasing.

## 2017-05-13 NOTE — ED Provider Notes (Signed)
[facility] Emergency Department Provider Note   ____________________________________________   I have reviewed the triage vital signs and the nursing notes.   HISTORY  Chief Complaint Back Pain    HPI Carolyn Cox is a 31 y.o. female presents to the emergency department with mid to upper back pain that developed 1-2 days ago. Patient is being followed by them are orthophoric for lumbar back pain and sciatica for which she has been seen several times in emergency department for. Patient has been evaluated, treated and is awaiting MRI results from a more emergent or the regarding lumbar back pain symptoms. Patient reports being on gabapentin, Flexeril and oxycodone for her lumbar symptoms. Patient was unable to see the emergent or 3 provider today and her pain was unbearable so she came to the emergency department. Patient denies any radicular symptoms into the bilateral upper extremities and localizes her pain along the thoracic spine and associated musculature.  Patient denies fever, chills, headache, vision changes, chest pain, chest tightness, shortness of breath, abdominal pain, nausea and vomiting.  History reviewed. No pertinent past medical history.  Patient Active Problem List   Diagnosis Date Noted  . Indication for care in labor and delivery, antepartum 07/17/2016  . Labor and delivery indication for care or intervention 07/17/2016  . Abdominal pain affecting pregnancy 07/13/2016  . Irregular uterine contractions 07/08/2016    Past Surgical History:  Procedure Laterality Date  . MANDIBLE FRACTURE SURGERY      Prior to Admission medications   Medication Sig Start Date End Date Taking? Authorizing Provider  amoxicillin-clavulanate (AUGMENTIN) 875-125 MG tablet Take 1 tablet by mouth 2 (two) times daily. 08/04/16   Hassan Rowan, MD  baclofen (LIORESAL) 10 MG tablet Take 1 tablet (10 mg total) by mouth daily. 04/09/17 04/09/18  Enid Derry, PA-C    cyclobenzaprine (FLEXERIL) 10 MG tablet Take 1 tablet (10 mg total) by mouth 3 (three) times daily as needed. 02/07/17   Darci Current, MD  gabapentin (NEURONTIN) 300 MG capsule Take 1 capsule (300 mg total) by mouth 3 (three) times daily. 03/01/17 03/01/18  Willy Eddy, MD  HYDROcodone-acetaminophen (NORCO/VICODIN) 5-325 MG tablet 1-2 tabs po qd prn 02/25/17   Payton Mccallum, MD  ibuprofen (ADVIL,MOTRIN) 600 MG tablet Take 1 tablet (600 mg total) by mouth every 6 (six) hours. 07/18/16   Nadara Mustard, MD  meloxicam (MOBIC) 15 MG tablet Take 1 tablet (15 mg total) by mouth daily. 08/04/16   Hassan Rowan, MD  metaxalone (SKELAXIN) 800 MG tablet Take 1 tablet (800 mg total) by mouth 3 (three) times daily. prn 02/25/17   Payton Mccallum, MD  oxyCODONE-acetaminophen (ROXICET) 5-325 MG tablet Take 1 tablet by mouth every 8 (eight) hours as needed for severe pain. 05/13/17   Deshanta Lady M, PA-C  predniSONE (STERAPRED UNI-PAK 21 TAB) 10 MG (21) TBPK tablet Take 6 tablets on day 1. Take 5 tablets on day 2. Take 4 tablets on day 3. Take 3 tablets on day 4. Take 2 tablets on day 5. Take 1 tablets on day 6. 05/13/17   Keren Alverio M, PA-C  Prenatal Vit-Fe Fumarate-FA (PRENATAL VITAMINS PLUS) 27-1 MG TABS Take 1 tablet by mouth every morning. 01/05/16   Joni Reining, PA-C    Allergies Patient has no known allergies.  No family history on file.  Social History Social History  Substance Use Topics  . Smoking status: Former Games developer  . Smokeless tobacco: Never Used  . Alcohol use No  Review of Systems Constitutional: Negative for fever/chills Eyes: No visual changes. ENT:  Negative for sore throat and for difficulty swallowing Cardiovascular: Denies chest pain. Respiratory: Denies cough. Denies shortness of breath. Gastrointestinal: No abdominal pain.  No nausea, vomiting, diarrhea. Genitourinary: Negative for dysuria. Musculoskeletal: Positive for upper thoracic back pain. Skin: Negative  for rash. Neurological: Negative for headaches.  Negative focal weakness or numbness. Negative for loss of consciousness. Able to ambulate. ____________________________________________   PHYSICAL EXAM:  VITAL SIGNS: ED Triage Vitals [05/13/17 1818]  Enc Vitals Group     BP 113/85     Pulse Rate 90     Resp 18     Temp 98.3 F (36.8 C)     Temp Source Oral     SpO2 100 %     Weight 123 lb (55.8 kg)     Height 5\' 6"  (1.676 m)     Head Circumference      Peak Flow      Pain Score 9     Pain Loc      Pain Edu?      Excl. in GC?     Constitutional: Alert and oriented. Well appearing and in no acute distress.  Eyes: Conjunctivae are normal. PERRL. EOMI  Head: Normocephalic and atraumatic. ENT:      Ears: Canals clear. TMs intact bilaterally.      Nose: No congestion/rhinnorhea.      Mouth/Throat: Mucous membranes are moist.  Neck:Supple. No thyromegaly. No stridor.  Cardiovascular: Normal rate, regular rhythm. Normal S1 and S2.  Good peripheral circulation. Respiratory: Normal respiratory effort without tachypnea or retractions. Lungs CTAB. Good air entry to the bases with no decreased or absent breath sounds. Hematological/Lymphatic/Immunological: No cervical lymphadenopathy. Cardiovascular: Normal rate, regular rhythm. Normal distal pulses. Respiratory: Normal respiratory effort. No wheezes/rales/rhonchi. Lungs CTAB with no W/R/R. Gastrointestinal: Bowel sounds 4 quadrants. Soft and nontender to palpation. No guarding or rigidity. No palpable masses. No distention. No CVA tenderness. Musculoskeletal: Upper thoracic back pain without traumatic injury. Palpable tenderness along bilateral paraspinals. No bony deformities noted, no radiculopathy. Intact range of motion of the cervical and thoracic spine. Nontender with normal range of motion in all extremities. Neurologic: Normal speech and language. No gross focal neurologic deficits are appreciated. No gait instability. Cranial  nerves: II-X intact. No sensory loss or abnormal reflexes.  Skin:  Skin is warm, dry and intact. No rash noted. Psychiatric: Mood and affect are normal. Speech and behavior are normal. Patient exhibits appropriate insight and judgement.  ____________________________________________   LABS (all labs ordered are listed, but only abnormal results are displayed)  Labs Reviewed - No data to display ____________________________________________  EKG None ____________________________________________  RADIOLOGY None ____________________________________________   PROCEDURES  Procedure(s) performed: No    Critical Care performed: no ____________________________________________   INITIAL IMPRESSION / ASSESSMENT AND PLAN / ED COURSE  Pertinent labs & imaging results that were available during my care of the patient were reviewed by me and considered in my medical decision making (see chart for details).  Patient presents to emergency department with upper thoracic back pain. History and physical exam findings are reassuring symptoms are consistent with thoracic back pain with muscle spasms. Patient noted improvement of symptoms following Decadron 10 mg IM during the course of care in the emergency department. Patient will be prescribed prednisone taper and short course of oxycodone for severe pain. Advised the patient that her provider at [facility] is the appropriate prescriber for her narcotic pain management and  that she needed to follow up with him moving forward. The emergency department would not be able to prescribe her narcotics during any future visits. Patient advised to return to the emergency department if symptoms return or worsen. Patient informed of clinical course, understand medical decision-making process, and agree with plan.   I reviewed the patient's prescription history over the last 12 months in the multi-state controlled substances database(s) that includes [place],  [place], [place], [place], [place], [place], [place], [place], [place], [place], [place], [place], [place], and [place].  Results were notable for multiple prescriptions for oxycodone over the last 4 months.  ____________________________________________   FINAL CLINICAL IMPRESSION(S) / ED DIAGNOSES  Final diagnoses:  Muscle spasm of back  Acute bilateral thoracic back pain       NEW MEDICATIONS STARTED DURING THIS VISIT:  Discharge Medication List as of 05/13/2017  8:17 PM    START taking these medications   Details  oxyCODONE-acetaminophen (ROXICET) 5-325 MG tablet Take 1 tablet by mouth every 8 (eight) hours as needed for severe pain., Starting Mon 05/13/2017, Print    predniSONE (STERAPRED UNI-PAK 21 TAB) 10 MG (21) TBPK tablet Take 6 tablets on day 1. Take 5 tablets on day 2. Take 4 tablets on day 3. Take 3 tablets on day 4. Take 2 tablets on day 5. Take 1 tablets on day 6., Print         Note:  This document was prepared using Dragon voice recognition software and may include unintentional dictation errors.    Mikael Skoda, Jordan Likes, PA-C 05/13/17 2329    Sharyn Creamer, MD 05/14/17 [phone]

## 2017-05-13 NOTE — Discharge Instructions (Signed)
Take medication as prescribed. Return to emergency department if symptoms worsen and follow-up with PCP as needed.   °

## 2017-05-13 NOTE — ED Notes (Signed)
States hx of sciatica and is being followed [facility], states MRI 3 weeks ago, states for the past few days general body aches and pain, was told by ortho to come to ED, awake and alert in no acute distress

## 2017-05-22 ENCOUNTER — Encounter: Payer: Self-pay | Admitting: Emergency Medicine

## 2017-05-22 ENCOUNTER — Emergency Department: Payer: Medicaid Other

## 2017-05-22 ENCOUNTER — Emergency Department
Admission: EM | Admit: 2017-05-22 | Discharge: 2017-05-22 | Disposition: A | Discharge status: Home / Self Care | Payer: Medicaid Other | Attending: Emergency Medicine | Admitting: Emergency Medicine

## 2017-05-22 DIAGNOSIS — F1410 Cocaine abuse, uncomplicated: Secondary | ICD-10-CM

## 2017-05-22 DIAGNOSIS — Z79899 Other long term (current) drug therapy: Secondary | ICD-10-CM | POA: Insufficient documentation

## 2017-05-22 DIAGNOSIS — Z036 Encounter for observation for suspected toxic effect from ingested substance ruled out: Secondary | ICD-10-CM | POA: Insufficient documentation

## 2017-05-22 DIAGNOSIS — N390 Urinary tract infection, site not specified: Secondary | ICD-10-CM | POA: Diagnosis not present

## 2017-05-22 DIAGNOSIS — F1210 Cannabis abuse, uncomplicated: Secondary | ICD-10-CM | POA: Diagnosis not present

## 2017-05-22 DIAGNOSIS — R51 Headache: Secondary | ICD-10-CM | POA: Diagnosis not present

## 2017-05-22 DIAGNOSIS — Z87891 Personal history of nicotine dependence: Secondary | ICD-10-CM | POA: Insufficient documentation

## 2017-05-22 LAB — CBC WITH DIFFERENTIAL/PLATELET
BASOS PCT: 0 %
Basophils Absolute: 0.1 10*3/uL (ref 0–0.1)
EOS ABS: 0 10*3/uL (ref 0–0.7)
Eosinophils Relative: 0 %
HCT: 37.7 % (ref 35.0–47.0)
HEMOGLOBIN: 12.4 g/dL (ref 12.0–16.0)
Lymphocytes Relative: 14 %
Lymphs Abs: 2 10*3/uL (ref 1.0–3.6)
MCH: 26.8 pg (ref 26.0–34.0)
MCHC: 33 g/dL (ref 32.0–36.0)
MCV: 81.3 fL (ref 80.0–100.0)
MONO ABS: 0.9 10*3/uL (ref 0.2–0.9)
MONOS PCT: 6 %
NEUTROS PCT: 80 %
Neutro Abs: 11 10*3/uL — ABNORMAL HIGH (ref 1.4–6.5)
Platelets: 256 10*3/uL (ref 150–440)
RBC: 4.64 MIL/uL (ref 3.80–5.20)
RDW: 16.1 % — AB (ref 11.5–14.5)
WBC: 13.9 10*3/uL — ABNORMAL HIGH (ref 3.6–11.0)

## 2017-05-22 LAB — URINE DRUG SCREEN, QUALITATIVE (ARMC ONLY)
Amphetamines, Ur Screen: NOT DETECTED
BARBITURATES, UR SCREEN: NOT DETECTED
Benzodiazepine, Ur Scrn: NOT DETECTED
CANNABINOID 50 NG, UR ~~LOC~~: POSITIVE — AB
COCAINE METABOLITE, UR ~~LOC~~: POSITIVE — AB
MDMA (Ecstasy)Ur Screen: NOT DETECTED
Methadone Scn, Ur: NOT DETECTED
Opiate, Ur Screen: NOT DETECTED
Phencyclidine (PCP) Ur S: NOT DETECTED
TRICYCLIC, UR SCREEN: NOT DETECTED

## 2017-05-22 LAB — URINALYSIS, COMPLETE (UACMP) WITH MICROSCOPIC
Bilirubin Urine: NEGATIVE
GLUCOSE, UA: NEGATIVE mg/dL
Hgb urine dipstick: NEGATIVE
Ketones, ur: NEGATIVE mg/dL
Nitrite: NEGATIVE
PH: 6 (ref 5.0–8.0)
Protein, ur: 30 mg/dL — AB
SPECIFIC GRAVITY, URINE: 1.018 (ref 1.005–1.030)

## 2017-05-22 LAB — COMPREHENSIVE METABOLIC PANEL
ALBUMIN: 4.2 g/dL (ref 3.5–5.0)
ALT: 29 U/L (ref 14–54)
ANION GAP: 10 (ref 5–15)
AST: 27 U/L (ref 15–41)
Alkaline Phosphatase: 47 U/L (ref 38–126)
BUN: 10 mg/dL (ref 6–20)
CO2: 26 mmol/L (ref 22–32)
Calcium: 9.3 mg/dL (ref 8.9–10.3)
Chloride: 107 mmol/L (ref 101–111)
Creatinine, Ser: 0.64 mg/dL (ref 0.44–1.00)
GFR calc Af Amer: >60 mL/min (ref >60)
GFR calc non Af Amer: >60 mL/min (ref >60)
Glucose, Bld: 117 mg/dL — ABNORMAL HIGH (ref 65–99)
POTASSIUM: 3.9 mmol/L (ref 3.5–5.1)
SODIUM: 143 mmol/L (ref 135–145)
TOTAL PROTEIN: 8.1 g/dL (ref 6.5–8.1)
Total Bilirubin: 0.9 mg/dL (ref 0.3–1.2)

## 2017-05-22 LAB — POCT PREGNANCY, URINE: Preg Test, Ur: NEGATIVE

## 2017-05-22 LAB — TROPONIN I: Troponin I: <0.03 ng/mL (ref <0.03)

## 2017-05-22 LAB — ETHANOL

## 2017-05-22 LAB — ACETAMINOPHEN LEVEL: Acetaminophen (Tylenol), Serum: <10 ug/mL — ABNORMAL LOW (ref 10–30)

## 2017-05-22 LAB — SALICYLATE LEVEL

## 2017-05-22 MED ORDER — LORAZEPAM 1 MG PO TABS
1.0000 mg | ORAL_TABLET | Freq: Once | ORAL | Status: AC
  Administered 2017-05-22: 1 mg via ORAL
  Filled 2017-05-22: qty 1

## 2017-05-22 MED ORDER — LORAZEPAM 2 MG/ML IJ SOLN
1.0000 mg | Freq: Once | INTRAMUSCULAR | Status: AC
  Administered 2017-05-22: 1 mg via INTRAVENOUS
  Filled 2017-05-22: qty 1

## 2017-05-22 MED ORDER — CEPHALEXIN 500 MG PO CAPS: 500.0000 mg | ORAL_CAPSULE | Freq: Three times a day (TID) | ORAL | 0 refills | Status: DC

## 2017-05-22 MED ORDER — SODIUM CHLORIDE 0.9 % IV BOLUS (SEPSIS)
1000.0000 mL | Freq: Once | INTRAVENOUS | Status: AC
  Administered 2017-05-22: 1000 mL via INTRAVENOUS

## 2017-05-22 MED ORDER — DEXTROSE 5 % IV SOLN
1.0000 g | INTRAVENOUS | Status: DC
  Administered 2017-05-22: 1 g via INTRAVENOUS
  Filled 2017-05-22: qty 10

## 2017-05-22 NOTE — ED Notes (Signed)
Instructions reviewed with patient and she verbalized understanding. Pt released to [facility] with [place] [facility] planning/management officer under their custody.

## 2017-05-22 NOTE — ED Notes (Signed)
Patient discharged to home per MD order. Patient in stable condition, and deemed medically cleared by ED provider for discharge. Discharge instructions reviewed with patient/family using "Teach Back"; verbalized understanding of medication education and administration, and information about follow-up care. Denies further concerns. ° °

## 2017-05-22 NOTE — ED Triage Notes (Signed)
Patient to ER [facility] with [employer] for c/o possible overdose. Patient states she was handed bag of cocaine by someone else so she is unaware of how much cocaine was present. Patient then inserted cocaine into vagina, bag either busted or leaked, spilling out cocaine. Patient arrives with white powdery substance in clear plastic bag on EMS stretcher.

## 2017-05-22 NOTE — ED Provider Notes (Signed)
[facility] Emergency Department Provider Note   ____________________________________________   None    (approximate)  I have reviewed the triage vital signs and the nursing notes.   HISTORY  Chief Complaint Drug Overdose    HPI Carolyn Cox is a 31 y.o. female brought to the ED from jail by policewith the chief complaint of possible cocaine overdose. Patient admits to IV cocaine use earlier this evening. She was pulled over by police this morning and inserted a plastic baggy containing cocaine into her vagina. While she was at the jail patient reports the bag burst and she was spilling cocaine out of her vagina. Denies ingestion or rectal insertion of cocaine. Initially denied complaints and patient was brought for medical clearance. Since arrival, patient is now complaining of left-sided chest pain and global headache. Denies associated fever, chills, shortness of breath, abdominal pain, nausea/vomiting, dizziness, dysuria, diarrhea. Denies recent travel or trauma. Nothing makes her symptoms better or worse.   Past medical history None  Patient Active Problem List   Diagnosis Date Noted  . Indication for care in labor and delivery, antepartum 07/17/2016  . Labor and delivery indication for care or intervention 07/17/2016  . Abdominal pain affecting pregnancy 07/13/2016  . Irregular uterine contractions 07/08/2016    Past Surgical History:  Procedure Laterality Date  . MANDIBLE FRACTURE SURGERY      Prior to Admission medications   Medication Sig Start Date End Date Taking? Authorizing Provider  amoxicillin-clavulanate (AUGMENTIN) 875-125 MG tablet Take 1 tablet by mouth 2 (two) times daily. 08/04/16   Hassan Rowan, MD  baclofen (LIORESAL) 10 MG tablet Take 1 tablet (10 mg total) by mouth daily. 04/09/17 04/09/18  Enid Derry, PA-C  cephALEXin (KEFLEX) 500 MG capsule Take 1 capsule (500 mg total) by mouth 3 (three) times daily. 05/22/17   Irean Hong, MD  cyclobenzaprine (FLEXERIL) 10 MG tablet Take 1 tablet (10 mg total) by mouth 3 (three) times daily as needed. 02/07/17   Darci Current, MD  gabapentin (NEURONTIN) 300 MG capsule Take 1 capsule (300 mg total) by mouth 3 (three) times daily. 03/01/17 03/01/18  Willy Eddy, MD  HYDROcodone-acetaminophen (NORCO/VICODIN) 5-325 MG tablet 1-2 tabs po qd prn 02/25/17   Payton Mccallum, MD  ibuprofen (ADVIL,MOTRIN) 600 MG tablet Take 1 tablet (600 mg total) by mouth every 6 (six) hours. 07/18/16   Nadara Mustard, MD  meloxicam (MOBIC) 15 MG tablet Take 1 tablet (15 mg total) by mouth daily. 08/04/16   Hassan Rowan, MD  metaxalone (SKELAXIN) 800 MG tablet Take 1 tablet (800 mg total) by mouth 3 (three) times daily. prn 02/25/17   Payton Mccallum, MD  oxyCODONE-acetaminophen (ROXICET) 5-325 MG tablet Take 1 tablet by mouth every 8 (eight) hours as needed for severe pain. 05/13/17   Little, Traci M, PA-C  predniSONE (STERAPRED UNI-PAK 21 TAB) 10 MG (21) TBPK tablet Take 6 tablets on day 1. Take 5 tablets on day 2. Take 4 tablets on day 3. Take 3 tablets on day 4. Take 2 tablets on day 5. Take 1 tablets on day 6. 05/13/17   Little, Traci M, PA-C  Prenatal Vit-Fe Fumarate-FA (PRENATAL VITAMINS PLUS) 27-1 MG TABS Take 1 tablet by mouth every morning. 01/05/16   Joni Reining, PA-C    Allergies Patient has no known allergies.  No family history on file.  Social History Social History  Substance Use Topics  . Smoking status: Former Games developer  . Smokeless tobacco: Never  Used  . Alcohol use No    Review of Systems  Constitutional: No fever/chills. Eyes: No visual changes. ENT: No sore throat. Cardiovascular: Positive for chest pain. Respiratory: Denies shortness of breath. Gastrointestinal: No abdominal pain.  No nausea, no vomiting.  No diarrhea.  No constipation. Genitourinary: Negative for dysuria. Musculoskeletal: Negative for back pain. Skin: Negative for rash. Neurological: Positive  for headache. Negative for focal weakness or numbness.   ____________________________________________   PHYSICAL EXAM:  VITAL SIGNS: ED Triage Vitals  Enc Vitals Group     BP 05/22/17 0129 138/85     Pulse Rate 05/22/17 0129 (!) 105     Resp 05/22/17 0129 18     Temp 05/22/17 0129 99.3 F (37.4 C)     Temp Source 05/22/17 0129 Oral     SpO2 05/22/17 0129 98 %     Weight 05/22/17 0134 123 lb (55.8 kg)     Height 05/22/17 0134 5\' 6"  (1.676 m)     Head Circumference --      Peak Flow --      Pain Score 05/22/17 0133 0     Pain Loc --      Pain Edu? --      Excl. in GC? --     Constitutional: Alert and oriented. Well appearing and in no acute distress. Eyes: Conjunctivae are normal. PERRL. EOMI. Head: Atraumatic. Nose: No congestion/rhinnorhea. Mouth/Throat: Mucous membranes are moist.  Oropharynx non-erythematous. Neck: No stridor.  No carotid bruits. Cardiovascular: Tachycardic rate, regular rhythm. Grossly normal heart sounds.  Good peripheral circulation. Respiratory: Normal respiratory effort.  No retractions. Lungs CTAB. Gastrointestinal: Soft and nontender. No distention. No abdominal bruits. No CVA tenderness. Genitourinary: Examined after patient urinated and wiped: External exam within normal limits. [place] powdery substance noted. Patient declined speculum exam. States all of the baggie as well as its contents has fallen out. Musculoskeletal: No lower extremity tenderness nor edema.  No joint effusions. Neurologic:  Normal speech and language. No gross focal neurologic deficits are appreciated. No gait instability. Skin:  Skin is warm, dry and intact. No rash noted. Psychiatric: Mood and affect are normal. Speech and behavior are normal.  ____________________________________________   LABS (all labs ordered are listed, but only abnormal results are displayed)  Labs Reviewed  COMPREHENSIVE METABOLIC PANEL - Abnormal; Notable for the following:       Result  Value   Glucose, Bld 117 (*)    All other components within normal limits  ACETAMINOPHEN LEVEL - Abnormal; Notable for the following:    Acetaminophen (Tylenol), Serum <10 (*)    All other components within normal limits  URINE DRUG SCREEN, QUALITATIVE ([facility] ONLY) - Abnormal; Notable for the following:    Cocaine Metabolite,Ur [place] POSITIVE (*)    Cannabinoid 50 Ng, Ur [place] POSITIVE (*)    All other components within normal limits  CBC WITH DIFFERENTIAL/PLATELET - Abnormal; Notable for the following:    WBC 13.9 (*)    RDW 16.1 (*)    Neutro Abs 11.0 (*)    All other components within normal limits  URINALYSIS, COMPLETE (UACMP) WITH MICROSCOPIC - Abnormal; Notable for the following:    Color, Urine YELLOW (*)    APPearance CLOUDY (*)    Protein, ur 30 (*)    Leukocytes, UA MODERATE (*)    Bacteria, UA MANY (*)    Squamous Epithelial / LPF 0-5 (*)    All other components within normal limits  SALICYLATE LEVEL  ETHANOL  TROPONIN I  TROPONIN I  POC URINE PREG, ED  POCT PREGNANCY, URINE   ____________________________________________  EKG  ED ECG REPORT I, Shyler Hamill J, the attending physician, personally viewed and interpreted this ECG.   Date: 05/22/2017  EKG Time: 0128  Rate: 106  Rhythm: sinus tachycardia  Axis: Normal  Intervals:none  ST&T Change: Nonspecific  ____________________________________________  RADIOLOGY  Ct Head Wo Contrast  Result Date: 05/22/2017 CLINICAL DATA:  Possible overdose EXAM: CT HEAD WITHOUT CONTRAST TECHNIQUE: Contiguous axial images were obtained from the base of the skull through the vertex without intravenous contrast. COMPARISON:  None. FINDINGS: Brain: No evidence of acute infarction, hemorrhage, hydrocephalus, extra-axial collection or mass lesion/mass effect. Vascular: No hyperdense vessel or unexpected calcification. Skull: Normal. Negative for fracture or focal lesion. Sinuses/Orbits: No acute finding. Other: None. IMPRESSION: No CT  evidence for acute intracranial abnormality. Electronically Signed   By: Jasmine Pang M.D.   On: 05/22/2017 03:22   Dg Abdomen Acute W/chest  Result Date: 05/22/2017 CLINICAL DATA:  Possible overdose EXAM: DG ABDOMEN ACUTE W/ 1V CHEST COMPARISON:  10/13/2016 FINDINGS: There is no evidence of dilated bowel loops or free intraperitoneal air. No radiopaque calculi or other significant radiographic abnormality is seen. Heart size and mediastinal contours are within normal limits. Both lungs are clear. Small calcified phleboliths in the pelvis. Slight asymmetric appearance of pubic symphysis. IMPRESSION: Negative abdominal radiographs.  No acute cardiopulmonary disease. Electronically Signed   By: Jasmine Pang M.D.   On: 05/22/2017 03:02    ____________________________________________   PROCEDURES  Procedure(s) performed: None  Procedures  Critical Care performed: No  ____________________________________________   INITIAL IMPRESSION / ASSESSMENT AND PLAN / ED COURSE  Pertinent labs & imaging results that were available during my care of the patient were reviewed by me and considered in my medical decision making (see chart for details).  31 year old female using IV cocaine brought by police for medical clearance from jail when a baggie of cocaine burst from her vagina.Currently complaining of chest pain and headache. EKG is unged. Initial troponin unremarkable; will repeat timed troponin, CT head for intracranial hemorrhage and abdominal x-rays to evaluate for further foreign bodies.  Clinical Course as of May 22 541  Wed May 22, 2017  1610 Repeat troponin negative. HR normalized. Updated patient of laboratory, urinalysis and imaging results. Strict return precautions given. Patient verbalizes understanding and agrees with plan of care.  [JS]    Clinical Course User Index [JS] Irean Hong, MD     ____________________________________________   FINAL CLINICAL IMPRESSION(S) / ED  DIAGNOSES  Final diagnoses:  Cocaine abuse  Urinary tract infection without hematuria, site unspecified  Marijuana abuse      NEW MEDICATIONS STARTED DURING THIS VISIT:  New Prescriptions   CEPHALEXIN (KEFLEX) 500 MG CAPSULE    Take 1 capsule (500 mg total) by mouth 3 (three) times daily.     Note:  This document was prepared using Dragon voice recognition software and may include unintentional dictation errors.    Irean Hong, MD 05/22/17 [phone]

## 2017-05-22 NOTE — ED Notes (Signed)
Patient continues to state that she needs to have BM. States she needs enema. Advised that KUB and labs were WNL per MD Dolores Frame. Patient states she wants to try to get stool out, that she usually has BM shortly after doing cocaine. Patient continues to sit on toilet to attempt to have BM.

## 2017-05-22 NOTE — Discharge Instructions (Signed)
1. Take antibiotic as prescribed (Keflex 500mg three times daily x 7 days). °2. Return to the ER for worsening symptoms, persistent vomiting, difficulty breathing or other concerns. °

## 2017-05-22 NOTE — ED Notes (Signed)
Patient being discharged, states she feels anxious again. Heart rate up to 124 with anxiety. Patient able to calm down and lower HR. Patient given Ativan prior to transport to jail to help with anxiety.

## 2017-05-22 NOTE — ED Notes (Signed)
Patient continues to state that she can't leave d/t anxiety, needing to have BM, etc. MD Dolores Frame aware. Patient given discharge papers.

## 2017-05-24 ENCOUNTER — Ambulatory Visit: Payer: Self-pay | Admitting: Obstetrics and Gynecology

## 2017-09-07 DIAGNOSIS — I428 Other cardiomyopathies: Secondary | ICD-10-CM

## 2017-09-07 DIAGNOSIS — I5021 Acute systolic (congestive) heart failure: Secondary | ICD-10-CM

## 2017-09-07 HISTORY — DX: Other cardiomyopathies: I42.8

## 2017-09-07 HISTORY — DX: Acute systolic (congestive) heart failure: I50.21
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
## 2017-09-18 ENCOUNTER — Emergency Department: Payer: Medicaid Other

## 2017-09-18 ENCOUNTER — Inpatient Hospital Stay (HOSPITAL_COMMUNITY)
Admit: 2017-09-18 | Discharge: 2017-09-18 | Disposition: A | Discharge status: Home / Self Care | Payer: Medicaid Other | Attending: Internal Medicine | Admitting: Internal Medicine

## 2017-09-18 ENCOUNTER — Encounter: Payer: Self-pay | Admitting: Emergency Medicine

## 2017-09-18 ENCOUNTER — Inpatient Hospital Stay
Admission: EM | Admit: 2017-09-18 | Discharge: 2017-09-21 | DRG: 286 | Disposition: A | Discharge status: Home / Self Care | Payer: Medicaid Other | Attending: Internal Medicine | Admitting: Internal Medicine

## 2017-09-18 DIAGNOSIS — F1410 Cocaine abuse, uncomplicated: Secondary | ICD-10-CM | POA: Diagnosis present

## 2017-09-18 DIAGNOSIS — F1910 Other psychoactive substance abuse, uncomplicated: Secondary | ICD-10-CM | POA: Diagnosis not present

## 2017-09-18 DIAGNOSIS — F1510 Other stimulant abuse, uncomplicated: Secondary | ICD-10-CM

## 2017-09-18 DIAGNOSIS — E876 Hypokalemia: Secondary | ICD-10-CM | POA: Diagnosis present

## 2017-09-18 DIAGNOSIS — Z79899 Other long term (current) drug therapy: Secondary | ICD-10-CM | POA: Diagnosis not present

## 2017-09-18 DIAGNOSIS — R7989 Other specified abnormal findings of blood chemistry: Secondary | ICD-10-CM | POA: Diagnosis present

## 2017-09-18 DIAGNOSIS — F17210 Nicotine dependence, cigarettes, uncomplicated: Secondary | ICD-10-CM | POA: Diagnosis present

## 2017-09-18 DIAGNOSIS — F1290 Cannabis use, unspecified, uncomplicated: Secondary | ICD-10-CM | POA: Diagnosis present

## 2017-09-18 DIAGNOSIS — I429 Cardiomyopathy, unspecified: Secondary | ICD-10-CM | POA: Diagnosis present

## 2017-09-18 DIAGNOSIS — I340 Nonrheumatic mitral (valve) insufficiency: Secondary | ICD-10-CM | POA: Diagnosis not present

## 2017-09-18 DIAGNOSIS — I5021 Acute systolic (congestive) heart failure: Secondary | ICD-10-CM | POA: Diagnosis present

## 2017-09-18 DIAGNOSIS — R002 Palpitations: Secondary | ICD-10-CM | POA: Diagnosis present

## 2017-09-18 DIAGNOSIS — M5442 Lumbago with sciatica, left side: Secondary | ICD-10-CM | POA: Diagnosis present

## 2017-09-18 DIAGNOSIS — R079 Chest pain, unspecified: Secondary | ICD-10-CM | POA: Diagnosis not present

## 2017-09-18 DIAGNOSIS — F419 Anxiety disorder, unspecified: Secondary | ICD-10-CM | POA: Diagnosis present

## 2017-09-18 DIAGNOSIS — R000 Tachycardia, unspecified: Secondary | ICD-10-CM | POA: Diagnosis present

## 2017-09-18 DIAGNOSIS — R946 Abnormal results of thyroid function studies: Secondary | ICD-10-CM | POA: Diagnosis present

## 2017-09-18 DIAGNOSIS — G8929 Other chronic pain: Secondary | ICD-10-CM | POA: Diagnosis present

## 2017-09-18 DIAGNOSIS — R778 Other specified abnormalities of plasma proteins: Secondary | ICD-10-CM | POA: Diagnosis present

## 2017-09-18 DIAGNOSIS — R748 Abnormal levels of other serum enzymes: Secondary | ICD-10-CM | POA: Diagnosis present

## 2017-09-18 DIAGNOSIS — M5440 Lumbago with sciatica, unspecified side: Secondary | ICD-10-CM

## 2017-09-18 DIAGNOSIS — F19980 Other psychoactive substance use, unspecified with psychoactive substance-induced anxiety disorder: Secondary | ICD-10-CM

## 2017-09-18 HISTORY — DX: Other psychoactive substance abuse, uncomplicated: F19.10

## 2017-09-18 HISTORY — DX: Sciatica, unspecified side: M54.30

## 2017-09-18 LAB — CBC
HEMATOCRIT: 38.7 % (ref 35.0–47.0)
Hemoglobin: 12.8 g/dL (ref 12.0–16.0)
MCH: 27.9 pg (ref 26.0–34.0)
MCHC: 33 g/dL (ref 32.0–36.0)
MCV: 84.5 fL (ref 80.0–100.0)
Platelets: 199 10*3/uL (ref 150–440)
RBC: 4.58 MIL/uL (ref 3.80–5.20)
RDW: 13.6 % (ref 11.5–14.5)
WBC: 9 10*3/uL (ref 3.6–11.0)

## 2017-09-18 LAB — BASIC METABOLIC PANEL
Anion gap: 9 (ref 5–15)
BUN: 6 mg/dL (ref 6–20)
CHLORIDE: 103 mmol/L (ref 101–111)
CO2: 24 mmol/L (ref 22–32)
Calcium: 9.1 mg/dL (ref 8.9–10.3)
Creatinine, Ser: 0.56 mg/dL (ref 0.44–1.00)
GFR calc Af Amer: >60 mL/min (ref >60)
GFR calc non Af Amer: >60 mL/min (ref >60)
GLUCOSE: 110 mg/dL — AB (ref 65–99)
POTASSIUM: 3.2 mmol/L — AB (ref 3.5–5.1)
Sodium: 136 mmol/L (ref 135–145)

## 2017-09-18 LAB — URINE DRUG SCREEN, QUALITATIVE (ARMC ONLY)
Amphetamines, Ur Screen: POSITIVE — AB
BARBITURATES, UR SCREEN: NOT DETECTED
BENZODIAZEPINE, UR SCRN: NOT DETECTED
CANNABINOID 50 NG, UR ~~LOC~~: POSITIVE — AB
Cocaine Metabolite,Ur ~~LOC~~: NOT DETECTED
MDMA (ECSTASY) UR SCREEN: NOT DETECTED
Methadone Scn, Ur: NOT DETECTED
Opiate, Ur Screen: NOT DETECTED
Phencyclidine (PCP) Ur S: NOT DETECTED
TRICYCLIC, UR SCREEN: NOT DETECTED

## 2017-09-18 LAB — APTT: APTT: 32 s (ref 24–36)

## 2017-09-18 LAB — URINALYSIS, COMPLETE (UACMP) WITH MICROSCOPIC
Bacteria, UA: NONE SEEN
Bilirubin Urine: NEGATIVE
GLUCOSE, UA: NEGATIVE mg/dL
HGB URINE DIPSTICK: NEGATIVE
Ketones, ur: 5 mg/dL — AB
Leukocytes, UA: NEGATIVE
NITRITE: NEGATIVE
PH: 6 (ref 5.0–8.0)
Protein, ur: NEGATIVE mg/dL
SPECIFIC GRAVITY, URINE: 1.017 (ref 1.005–1.030)

## 2017-09-18 LAB — MAGNESIUM: Magnesium: 1.8 mg/dL (ref 1.7–2.4)

## 2017-09-18 LAB — POCT PREGNANCY, URINE: Preg Test, Ur: NEGATIVE

## 2017-09-18 LAB — TROPONIN I
Troponin I: 0.24 ng/mL (ref <0.03)
Troponin I: 0.76 ng/mL (ref <0.03)

## 2017-09-18 LAB — PROTIME-INR
INR: 0.91
Prothrombin Time: 12.2 seconds (ref 11.4–15.2)

## 2017-09-18 LAB — TSH: TSH: 0.322 u[IU]/mL — AB (ref 0.350–4.500)

## 2017-09-18 MED ORDER — HEPARIN SODIUM (PORCINE) 5000 UNIT/ML IJ SOLN: 5000.0000 [IU] | Freq: Three times a day (TID) | INTRAMUSCULAR | Status: DC

## 2017-09-18 MED ORDER — KETOROLAC TROMETHAMINE 15 MG/ML IJ SOLN
15.0000 mg | Freq: Four times a day (QID) | INTRAMUSCULAR | Status: DC | PRN
  Administered 2017-09-18 – 2017-09-21 (×4): 15 mg via INTRAVENOUS
  Filled 2017-09-18 (×4): qty 1

## 2017-09-18 MED ORDER — NICOTINE 14 MG/24HR TD PT24
14.0000 mg | MEDICATED_PATCH | Freq: Every day | TRANSDERMAL | Status: DC
  Administered 2017-09-18 – 2017-09-21 (×4): 14 mg via TRANSDERMAL
  Filled 2017-09-18 (×4): qty 1

## 2017-09-18 MED ORDER — ONDANSETRON HCL 4 MG/2ML IJ SOLN
4.0000 mg | Freq: Four times a day (QID) | INTRAMUSCULAR | Status: DC | PRN
  Administered 2017-09-19 – 2017-09-20 (×2): 4 mg via INTRAVENOUS
  Filled 2017-09-18 (×2): qty 2

## 2017-09-18 MED ORDER — ACETAMINOPHEN 650 MG RE SUPP: 650.0000 mg | Freq: Four times a day (QID) | RECTAL | Status: DC | PRN

## 2017-09-18 MED ORDER — LORAZEPAM 2 MG/ML IJ SOLN
1.0000 mg | Freq: Once | INTRAMUSCULAR | Status: AC
  Administered 2017-09-18: 1 mg via INTRAVENOUS
  Filled 2017-09-18: qty 1

## 2017-09-18 MED ORDER — HYDROCODONE-ACETAMINOPHEN 5-325 MG PO TABS
1.0000 | ORAL_TABLET | ORAL | Status: DC | PRN
  Administered 2017-09-19 – 2017-09-20 (×6): 2 via ORAL
  Filled 2017-09-18 (×6): qty 2

## 2017-09-18 MED ORDER — HEPARIN (PORCINE) IN NACL 100-0.45 UNIT/ML-% IJ SOLN
800.0000 [IU]/h | INTRAMUSCULAR | Status: DC
  Administered 2017-09-18: 800 [IU]/h via INTRAVENOUS
  Filled 2017-09-18: qty 250

## 2017-09-18 MED ORDER — ASPIRIN EC 81 MG PO TBEC
81.0000 mg | DELAYED_RELEASE_TABLET | Freq: Every day | ORAL | Status: DC
  Administered 2017-09-19 – 2017-09-21 (×3): 81 mg via ORAL
  Filled 2017-09-18 (×3): qty 1

## 2017-09-18 MED ORDER — HEPARIN BOLUS VIA INFUSION
4000.0000 [IU] | Freq: Once | INTRAVENOUS | Status: DC
  Filled 2017-09-18: qty 4000

## 2017-09-18 MED ORDER — HEPARIN (PORCINE) IN NACL 100-0.45 UNIT/ML-% IJ SOLN
750.0000 [IU]/h | INTRAMUSCULAR | Status: DC
Start: 2017-09-18 — End: 2017-09-18
  Filled 2017-09-18: qty 250

## 2017-09-18 MED ORDER — ACETAMINOPHEN 325 MG PO TABS
650.0000 mg | ORAL_TABLET | Freq: Four times a day (QID) | ORAL | Status: DC | PRN
  Administered 2017-09-18 – 2017-09-21 (×3): 650 mg via ORAL
  Filled 2017-09-18 (×3): qty 2

## 2017-09-18 MED ORDER — ASPIRIN EC 325 MG PO TBEC
325.0000 mg | DELAYED_RELEASE_TABLET | Freq: Once | ORAL | Status: AC
  Administered 2017-09-18: 325 mg via ORAL
  Filled 2017-09-18: qty 1

## 2017-09-18 MED ORDER — BUPIVACAINE HCL (PF) 0.5 % IJ SOLN
30.0000 mL | Freq: Once | INTRAMUSCULAR | Status: AC
  Administered 2017-09-18: 30 mL
  Filled 2017-09-18: qty 30

## 2017-09-18 MED ORDER — ONDANSETRON HCL 4 MG PO TABS: 4.0000 mg | ORAL_TABLET | Freq: Four times a day (QID) | ORAL | Status: DC | PRN

## 2017-09-18 MED ORDER — SODIUM CHLORIDE 0.9 % IV SOLN
INTRAVENOUS | Status: DC
  Administered 2017-09-18 – 2017-09-20 (×6): via INTRAVENOUS

## 2017-09-18 MED ORDER — POTASSIUM CHLORIDE CRYS ER 20 MEQ PO TBCR
40.0000 meq | EXTENDED_RELEASE_TABLET | ORAL | Status: AC
  Administered 2017-09-18 (×2): 40 meq via ORAL
  Filled 2017-09-18 (×2): qty 2

## 2017-09-18 MED ORDER — HEPARIN BOLUS VIA INFUSION
4000.0000 [IU] | Freq: Once | INTRAVENOUS | Status: AC
  Administered 2017-09-18: 4000 [IU] via INTRAVENOUS
  Filled 2017-09-18: qty 4000

## 2017-09-18 NOTE — ED Provider Notes (Signed)
[facility] Emergency Department Provider Note    None    (approximate)  I have reviewed the triage vital signs and the nursing notes.   HISTORY  Chief Complaint Palpitations    HPI Carolyn Cox is a 31 y.o. female with a history of IV cocaine abuse as well as chronic back pain presents with the chief complaint of palpitations and chest discomfort this started around 10 AM while patient was sitting still.  Denies any recent drug use.  Did do some "caffeine shots"this morning.  She states she never had discomfort like this before.  States that she felt like her head heart was racing there was fluttering in her chest.  Does feel anxious.  Denies any recent fevers.  No shortness of breath or cough.  No nausea or vomiting.  No orthopnea.  No lower extremity swelling.  Is also complaining of left lower back pain and worsening sciatica.  Past Medical History:  Diagnosis Date  . Sciatica    FMH: no sudden cardiac deaths Past Surgical History:  Procedure Laterality Date  . MANDIBLE FRACTURE SURGERY     Patient Active Problem List   Diagnosis Date Noted  . Elevated troponin 09/18/2017  . Indication for care in labor and delivery, antepartum 07/17/2016  . Labor and delivery indication for care or intervention 07/17/2016  . Abdominal pain affecting pregnancy 07/13/2016  . Irregular uterine contractions 07/08/2016      Prior to Admission medications   Medication Sig Start Date End Date Taking? Authorizing Provider  amoxicillin-clavulanate (AUGMENTIN) 875-125 MG tablet Take 1 tablet by mouth 2 (two) times daily. 08/04/16   Hassan Rowan, MD  baclofen (LIORESAL) 10 MG tablet Take 1 tablet (10 mg total) by mouth daily. 04/09/17 04/09/18  Enid Derry, PA-C  cephALEXin (KEFLEX) 500 MG capsule Take 1 capsule (500 mg total) by mouth 3 (three) times daily. 05/22/17   Irean Hong, MD  cyclobenzaprine (FLEXERIL) 10 MG tablet Take 1 tablet (10 mg total) by mouth 3  (three) times daily as needed. 02/07/17   Darci Current, MD  gabapentin (NEURONTIN) 300 MG capsule Take 1 capsule (300 mg total) by mouth 3 (three) times daily. 03/01/17 03/01/18  Willy Eddy, MD  HYDROcodone-acetaminophen (NORCO/VICODIN) 5-325 MG tablet 1-2 tabs po qd prn 02/25/17   Payton Mccallum, MD  ibuprofen (ADVIL,MOTRIN) 600 MG tablet Take 1 tablet (600 mg total) by mouth every 6 (six) hours. 07/18/16   Nadara Mustard, MD  meloxicam (MOBIC) 15 MG tablet Take 1 tablet (15 mg total) by mouth daily. 08/04/16   Hassan Rowan, MD  metaxalone (SKELAXIN) 800 MG tablet Take 1 tablet (800 mg total) by mouth 3 (three) times daily. prn 02/25/17   Payton Mccallum, MD  oxyCODONE-acetaminophen (ROXICET) 5-325 MG tablet Take 1 tablet by mouth every 8 (eight) hours as needed for severe pain. 05/13/17   Little, Traci M, PA-C  predniSONE (STERAPRED UNI-PAK 21 TAB) 10 MG (21) TBPK tablet Take 6 tablets on day 1. Take 5 tablets on day 2. Take 4 tablets on day 3. Take 3 tablets on day 4. Take 2 tablets on day 5. Take 1 tablets on day 6. 05/13/17   Little, Traci M, PA-C  Prenatal Vit-Fe Fumarate-FA (PRENATAL VITAMINS PLUS) 27-1 MG TABS Take 1 tablet by mouth every morning. 01/05/16   Joni Reining, PA-C    Allergies Patient has no known allergies.    Social History Social History   Tobacco Use  . Smoking status:  Former Smoker  . Smokeless tobacco: Never Used  Substance Use Topics  . Alcohol use: No  . Drug use: Yes    Types: Cocaine    Comment: last use 120 days ago from 09/18/17    Review of Systems Patient denies headaches, rhinorrhea, blurry vision, numbness, shortness of breath, chest pain, edema, cough, abdominal pain, nausea, vomiting, diarrhea, dysuria, fevers, rashes or hallucinations unless otherwise stated above in HPI. ____________________________________________   PHYSICAL EXAM:  VITAL SIGNS: Vitals:   09/18/17 1518 09/18/17 1707  BP: (!) 167/99 (!) 153/102  Pulse: 88   Resp:  16 14  Temp: 98.3 F (36.8 C)   SpO2: 99% 100%    Constitutional: Alert and oriented. Well appearing and in no acute distress. Eyes: Conjunctivae are normal.  Head: Atraumatic. Nose: No congestion/rhinnorhea. Mouth/Throat: Mucous membranes are moist.   Neck: No stridor. Painless ROM.  Cardiovascular: Normal rate, regular rhythm. Grossly normal heart sounds.  Good peripheral circulation. Respiratory: Normal respiratory effort.  No retractions. Lungs CTAB. Gastrointestinal: Soft and nontender. No distention. No abdominal bruits. No CVA tenderness. Genitourinary:  Musculoskeletal: No lower extremity tenderness nor edema. Point ttp of left lower lumbar paraspinal muscles No joint effusions. Neurologic:  Normal speech and language. No gross focal neurologic deficits are appreciated. No facial droop Skin:  Skin is warm, dry and intact. No rash noted.  Track marks noted to RUE Psychiatric: Mood and affect are normal. Speech and behavior are normal.  ____________________________________________   LABS (all labs ordered are listed, but only abnormal results are displayed)  Results for orders placed or performed during the hospital encounter of 09/18/17 (from the past 24 hour(s))  Basic metabolic panel     Status: Abnormal   Collection Time: 09/18/17  3:12 PM  Result Value Ref Range   Sodium 136 135 - 145 mmol/L   Potassium 3.2 (L) 3.5 - 5.1 mmol/L   Chloride 103 101 - 111 mmol/L   CO2 24 22 - 32 mmol/L   Glucose, Bld 110 (H) 65 - 99 mg/dL   BUN 6 6 - 20 mg/dL   Creatinine, Ser 1.61 0.44 - 1.00 mg/dL   Calcium 9.1 8.9 - 09.6 mg/dL   GFR calc non Af Amer >60 >60 mL/min   GFR calc Af Amer >60 >60 mL/min   Anion gap 9 5 - 15  CBC     Status: None   Collection Time: 09/18/17  3:12 PM  Result Value Ref Range   WBC 9.0 3.6 - 11.0 K/uL   RBC 4.58 3.80 - 5.20 MIL/uL   Hemoglobin 12.8 12.0 - 16.0 g/dL   HCT 04.5 40.9 - 81.1 %   MCV 84.5 80.0 - 100.0 fL   MCH 27.9 26.0 - 34.0 pg   MCHC  33.0 32.0 - 36.0 g/dL   RDW 91.4 78.2 - 95.6 %   Platelets 199 150 - 440 K/uL  Troponin I     Status: Abnormal   Collection Time: 09/18/17  3:12 PM  Result Value Ref Range   Troponin I 0.24 (HH) <0.03 ng/mL  Urine Drug Screen, Qualitative ([facility] only)     Status: Abnormal   Collection Time: 09/18/17  3:12 PM  Result Value Ref Range   Tricyclic, Ur Screen NONE DETECTED NONE DETECTED   Amphetamines, Ur Screen POSITIVE (A) NONE DETECTED   MDMA (Ecstasy)Ur Screen NONE DETECTED NONE DETECTED   Cocaine Metabolite,Ur  NONE DETECTED NONE DETECTED   Opiate, Ur Screen NONE DETECTED NONE DETECTED   Phencyclidine (  PCP) Ur S NONE DETECTED NONE DETECTED   Cannabinoid 50 Ng, Ur [place] POSITIVE (A) NONE DETECTED   Barbiturates, Ur Screen NONE DETECTED NONE DETECTED   Benzodiazepine, Ur Scrn NONE DETECTED NONE DETECTED   Methadone Scn, Ur NONE DETECTED NONE DETECTED  Urinalysis, Complete w Microscopic     Status: Abnormal   Collection Time: 09/18/17  3:12 PM  Result Value Ref Range   Color, Urine YELLOW (A) YELLOW   APPearance CLEAR (A) CLEAR   Specific Gravity, Urine 1.017 1.005 - 1.030   pH 6.0 5.0 - 8.0   Glucose, UA NEGATIVE NEGATIVE mg/dL   Hgb urine dipstick NEGATIVE NEGATIVE   Bilirubin Urine NEGATIVE NEGATIVE   Ketones, ur 5 (A) NEGATIVE mg/dL   Protein, ur NEGATIVE NEGATIVE mg/dL   Nitrite NEGATIVE NEGATIVE   Leukocytes, UA NEGATIVE NEGATIVE   RBC / HPF 0-5 0 - 5 RBC/hpf   WBC, UA 0-5 0 - 5 WBC/hpf   Bacteria, UA NONE SEEN NONE SEEN   Squamous Epithelial / LPF 0-5 (A) NONE SEEN   Mucus PRESENT   Pregnancy, urine POC     Status: None   Collection Time: 09/18/17  3:43 PM  Result Value Ref Range   Preg Test, Ur NEGATIVE NEGATIVE   ____________________________________________  EKG My review and personal interpretation at Time: 15:15   Indication: chest pain/palpitation  Rate: 85  Rhythm: sinus Axis: normal Other: borderline lvh criteria, no stemi, non specific st  changes ____________________________________________  RADIOLOGY  I personally reviewed all radiographic images ordered to evaluate for the above acute complaints and reviewed radiology reports and findings.  These findings were personally discussed with the patient.  Please see medical record for radiology report.  ____________________________________________   PROCEDURES  Procedure(s) performed:  Procedures    Critical Care performed: no ____________________________________________   INITIAL IMPRESSION / ASSESSMENT AND PLAN / ED COURSE  Pertinent labs & imaging results that were available during my care of the patient were reviewed by me and considered in my medical decision making (see chart for details).  DDX: ACS, pericarditis, esophagitis, boerhaaves, pe, dissection, pna, bronchitis, costochondritis   Carolyn Cox is a 31 y.o. who presents to the ED with chest pain and palpitations as described above.  Patient in no acute distress.  EKG shows no evidence of acute ischemia or dysrhythmia.  Patient's troponin is noted to be elevated to 0.24.  Patient does have a history of cocaine abuse but denies any recent use.  UDS does test positive for amphetamine.  Certainly concerning for substance induced cardiac ischemia.  Not consistent with dissection or PE.  No evidence of pneumonia.  Will treat with aspirin, IV benzodiazepines and nitro.  Patient was also complaining of similar left lower back sciatica which I performed trigger point injection with improvement in her symptoms.  No focal neuro deficits.  Spoke with Dr. Ladona Ridgel of cardiology regarding the patient's presentation and given her low risk factors he has recommended holding heparinization at this time.  We will initiate if she has increasing troponin on follow-up testing.  Spoke with Dr. Allena Katz of hospitalist who kindly agrees to admit patient for medical management and telemetry monitoring.      ____________________________________________   FINAL CLINICAL IMPRESSION(S) / ED DIAGNOSES  Final diagnoses:  Chest pain, unspecified type  Elevated troponin I level  Acute left-sided low back pain with sciatica, sciatica laterality unspecified      NEW MEDICATIONS STARTED DURING THIS VISIT:  This SmartLink is deprecated. Use AVSMEDLIST  instead to display the medication list for a patient.   Note:  This document was prepared using Dragon voice recognition software and may include unintentional dictation errors.    Willy Eddyobinson, Kameron Blethen, MD 09/18/17 [phone]

## 2017-09-18 NOTE — ED Notes (Addendum)
Pt approached the desk and asked if she had been called back yet because she was had left the lobby to go see another family member in another part of the hospital. Pt then taken back to room 24 for treatment. Pt was put on the cardiac monitor by this RN. edp aware of pt in room.

## 2017-09-18 NOTE — H&P (Signed)
[facility] - [place] at [facility]   PATIENT NAME: Carolyn Cox    MR#:  [id number]  DATE OF BIRTH:  05/11/1986  DATE OF ADMISSION:  09/18/2017  PRIMARY CARE PHYSICIAN: Mickey Farberhies, David, MD   REQUESTING/REFERRING PHYSICIAN: Willy Eddyobinson Patrick MD  CHIEF COMPLAINT:   Chief Complaint  Patient presents with  . Palpitations    HISTORY OF PRESENT ILLNESS: Carolyn ManKrystal Mcneill  is a 31 y.o. female with a known history of drug abuse in the past currently denies any drug use presenting with palpitations.  Patient does not have any chest pains however she was having palpitations she is noted to have a troponin that is elevated.  She does report taking some cough medication recently.  Her urine drug screen was positive for marijuana as well as amphetamines.  She denies any chest pain.  She said when she was having palpitations she was feeling a little short of breath PAST MEDICAL HISTORY:   Past Medical History:  Diagnosis Date  . Sciatica     PAST SURGICAL HISTORY:  Past Surgical History:  Procedure Laterality Date  . MANDIBLE FRACTURE SURGERY      SOCIAL HISTORY:  Social History   Tobacco Use  . Smoking status: Former Games developermoker  . Smokeless tobacco: Never Used  Substance Use Topics  . Alcohol use: No    FAMILY HISTORY:  Family History  Problem Relation Age of Onset  . Hypertension Mother     DRUG ALLERGIES: No Known Allergies  REVIEW OF SYSTEMS:   CONSTITUTIONAL: No fever, fatigue or weakness.  EYES: No blurred or double vision.  EARS, NOSE, AND THROAT: No tinnitus or ear pain.  RESPIRATORY: No cough, shortness of breath, wheezing or hemoptysis.  CARDIOVASCULAR: No chest pain, orthopnea, edema.  Positive palpitation GASTROINTESTINAL: No nausea, vomiting, diarrhea or abdominal pain.  GENITOURINARY: No dysuria, hematuria.  ENDOCRINE: No polyuria, nocturia,  HEMATOLOGY: No anemia, easy bruising or bleeding SKIN: No rash or lesion. MUSCULOSKELETAL: No joint pain or  arthritis.   NEUROLOGIC: No tingling, numbness, weakness.  PSYCHIATRY: No anxiety or depression.   MEDICATIONS AT HOME:  Prior to Admission medications   Medication Sig Start Date End Date Taking? Authorizing Provider  amoxicillin-clavulanate (AUGMENTIN) 875-125 MG tablet Take 1 tablet by mouth 2 (two) times daily. 08/04/16   Hassan RowanWade, Eugene, MD  baclofen (LIORESAL) 10 MG tablet Take 1 tablet (10 mg total) by mouth daily. 04/09/17 04/09/18  Enid DerryWagner, Ashley, PA-C  cephALEXin (KEFLEX) 500 MG capsule Take 1 capsule (500 mg total) by mouth 3 (three) times daily. 05/22/17   Irean HongSung, Jade J, MD  cyclobenzaprine (FLEXERIL) 10 MG tablet Take 1 tablet (10 mg total) by mouth 3 (three) times daily as needed. 02/07/17   Darci CurrentBrown, Altha N, MD  gabapentin (NEURONTIN) 300 MG capsule Take 1 capsule (300 mg total) by mouth 3 (three) times daily. 03/01/17 03/01/18  Willy Eddyobinson, Patrick, MD  HYDROcodone-acetaminophen (NORCO/VICODIN) 5-325 MG tablet 1-2 tabs po qd prn 02/25/17   Payton Mccallumonty, Orlando, MD  ibuprofen (ADVIL,MOTRIN) 600 MG tablet Take 1 tablet (600 mg total) by mouth every 6 (six) hours. 07/18/16   Nadara MustardHarris, Robert P, MD  meloxicam (MOBIC) 15 MG tablet Take 1 tablet (15 mg total) by mouth daily. 08/04/16   Hassan RowanWade, Eugene, MD  metaxalone (SKELAXIN) 800 MG tablet Take 1 tablet (800 mg total) by mouth 3 (three) times daily. prn 02/25/17   Payton Mccallumonty, Orlando, MD  oxyCODONE-acetaminophen (ROXICET) 5-325 MG tablet Take 1 tablet by mouth every 8 (eight) hours as needed  for severe pain. 05/13/17   Little, Traci M, PA-C  predniSONE (STERAPRED UNI-PAK 21 TAB) 10 MG (21) TBPK tablet Take 6 tablets on day 1. Take 5 tablets on day 2. Take 4 tablets on day 3. Take 3 tablets on day 4. Take 2 tablets on day 5. Take 1 tablets on day 6. 05/13/17   Little, Traci M, PA-C  Prenatal Vit-Fe Fumarate-FA (PRENATAL VITAMINS PLUS) 27-1 MG TABS Take 1 tablet by mouth every morning. 01/05/16   Joni Reining, PA-C      PHYSICAL EXAMINATION:   VITAL SIGNS: Blood  pressure (!) 153/102, pulse 88, temperature 98.3 F (36.8 C), temperature source Oral, resp. rate 14, height 5\' 6"  (1.676 m), weight 146 lb (66.2 kg), last menstrual period 09/11/2017, SpO2 100 %, not currently breastfeeding.  GENERAL:  31 y.o.-year-old patient lying in the bed with no acute distress.  EYES: Pupils equal, round, reactive to light and accommodation. No scleral icterus. Extraocular muscles intact.  HEENT: Head atraumatic, normocephalic. Oropharynx and nasopharynx clear.  NECK:  Supple, no jugular venous distention. No thyroid enlargement, no tenderness.  LUNGS: Normal breath sounds bilaterally, no wheezing, rales,rhonchi or crepitation. No use of accessory muscles of respiration.  CARDIOVASCULAR: S1, S2 normal. No murmurs, rubs, or gallops.  ABDOMEN: Soft, nontender, nondistended. Bowel sounds present. No organomegaly or mass.  EXTREMITIES: No pedal edema, cyanosis, or clubbing.  NEUROLOGIC: Cranial nerves II through XII are intact. Muscle strength 5/5 in all extremities. Sensation intact. Gait not checked.  PSYCHIATRIC: The patient is alert and oriented x 3.  SKIN: No obvious rash, lesion, or ulcer.   LABORATORY PANEL:   CBC Recent Labs  Lab 09/18/17 1512  WBC 9.0  HGB 12.8  HCT 38.7  PLT 199  MCV 84.5  MCH 27.9  MCHC 33.0  RDW 13.6   ------------------------------------------------------------------------------------------------------------------  Chemistries  Recent Labs  Lab 09/18/17 1512  NA 136  K 3.2*  CL 103  CO2 24  GLUCOSE 110*  BUN 6  CREATININE 0.56  CALCIUM 9.1   ------------------------------------------------------------------------------------------------------------------ estimated creatinine clearance is 97.1 mL/min (by C-G formula based on SCr of 0.56 mg/dL). ------------------------------------------------------------------------------------------------------------------ No results for input(s): TSH, T4TOTAL, T3FREE, THYROIDAB in the  last 72 hours.  Invalid input(s): FREET3   Coagulation profile No results for input(s): INR, PROTIME in the last 168 hours. ------------------------------------------------------------------------------------------------------------------- No results for input(s): DDIMER in the last 72 hours. -------------------------------------------------------------------------------------------------------------------  Cardiac Enzymes Recent Labs  Lab 09/18/17 1512  TROPONINI 0.24*   ------------------------------------------------------------------------------------------------------------------ Invalid input(s): POCBNP  ---------------------------------------------------------------------------------------------------------------  Urinalysis    Component Value Date/Time   COLORURINE YELLOW (A) 09/18/2017 1512   APPEARANCEUR CLEAR (A) 09/18/2017 1512   APPEARANCEUR Cloudy 05/25/2014 0725   LABSPEC 1.017 09/18/2017 1512   LABSPEC 1.018 05/25/2014 0725   PHURINE 6.0 09/18/2017 1512   GLUCOSEU NEGATIVE 09/18/2017 1512   GLUCOSEU Negative 05/25/2014 0725   HGBUR NEGATIVE 09/18/2017 1512   BILIRUBINUR NEGATIVE 09/18/2017 1512   BILIRUBINUR Negative 05/25/2014 0725   KETONESUR 5 (A) 09/18/2017 1512   PROTEINUR NEGATIVE 09/18/2017 1512   NITRITE NEGATIVE 09/18/2017 1512   LEUKOCYTESUR NEGATIVE 09/18/2017 1512   LEUKOCYTESUR 3+ 05/25/2014 0725     RADIOLOGY: Dg Chest 2 View  Result Date: 09/18/2017 CLINICAL DATA:  Palpitations, tachycardia. Onset after ingesting coughing this morning. First time occurrence. EXAM: CHEST  2 VIEW COMPARISON:  Chest x-ray of May 22, 2017. FINDINGS: The lungs are hyperinflated with mild hemidiaphragm flattening. There is no focal infiltrate. There is no pleural effusion or pneumothorax.  The heart and pulmonary vascularity are normal. The mediastinum is normal in width. There is gentle curvature of the midthoracic spine convex toward the right at appears  positional. IMPRESSION: Mild hyperinflation may be voluntary or may reflect underlying reactive airway disease and the patient's smoking history. No acute cardiopulmonary abnormality is observed. Electronically Signed   By: David  [place] M.D.   On: 09/18/2017 16:00    EKG: Orders placed or performed during the hospital encounter of 09/18/17  . ED EKG within 10 minutes  . EKG 12-Lead  . EKG 12-Lead  . ED EKG within 10 minutes    IMPRESSION AND PLAN: Patient is a 31 year old presenting with palpitation  1.  Elevated troponin possibly related to her palpitations Will check echocardiogram ER physician has discussed the case with Dr. Rushie Goltz cardiology who does not recommend heparin We will check serial cardiac enzymes Place patient on aspirin  2.  Hypokalemia replace her potassium check magnesium  3. miscellaneous Lovenox for DVT prophylaxis  All the records are reviewed and case discussed with ED provider. Management plans discussed with the patient, family and they are in agreement.  CODE STATUS: Code Status History    Date Active Date Inactive Code Status Order ID Comments User Context   07/17/2016 08:04 07/18/2016 19:05 Full Code [id number]  Tresea Mall, CNM Inpatient   07/17/2016 06:26 07/17/2016 08:04 Full Code [id number]  Tresea Mall, CNM Inpatient   07/17/2016 05:31 07/17/2016 06:26 Full Code [id number]  Tresea Mall, CNM Inpatient   07/13/2016 02:36 07/13/2016 06:02 Full Code [id number]  Nadara Mustard, MD Inpatient   07/08/2016 03:55 07/08/2016 12:36 Full Code [id number]  Vena Austria, MD Inpatient       TOTAL TIME TAKING CARE OF THIS PATIENT: 50 minutes.    Auburn Bilberry M.D on 09/18/2017 at 5:50 PM  Between 7am to 6pm - Pager - [phone]  After 6pm go to [email or web address] - password [email or web address] [facility]  [place] [facility]  Office  [phone]  CC: Primary care physician; Mickey Farber, MD

## 2017-09-18 NOTE — ED Triage Notes (Signed)
Pt comes into the ED via POV c/o rapid heal rate and the feeling of palpitations.  Patient denies any chest pain, shortness of breath, dizziness, or nausea.  Denies ever having these symptoms in the past.  Patient states she has been having sciatica issues and she isn't sure if her being upset about that has created the feeling of a fast heart rate.  Patient in NAD at this time with even and unlabored respirations.

## 2017-09-18 NOTE — ED Notes (Addendum)
Pt called to take back to tx room and is not answering out in lobby. Will try to contact pt at home. Cell number is a non working number. Unable to leave VM on home number provided in chart.

## 2017-09-18 NOTE — ED Notes (Signed)
Pt reports that she was taking coffee shots this morning and then started feeling "heart palpitations" - she denies chest pain or pressure - reports lower back pain - denies n/v - reports normal appetite

## 2017-09-18 NOTE — Consult Note (Signed)
ANTICOAGULATION CONSULT NOTE - Initial Consult  Pharmacy Consult for Heparin Drip  Indication: chest pain/ACS  No Known Allergies  Patient Measurements: Height: 5\' 6"  (167.6 cm) Weight: 146 lb (66.2 kg) IBW/kg (Calculated) : 59.3  Vital Signs: Temp: 98.3 F (36.8 C) (12/12 1518) Temp Source: Oral (12/12 1518) BP: 153/102 (12/12 1707) Pulse Rate: 88 (12/12 1518)  Labs: Recent Labs    09/18/17 1512  HGB 12.8  HCT 38.7  PLT 199  CREATININE 0.56  TROPONINI 0.24*    Estimated Creatinine Clearance: 97.1 mL/min (by C-G formula based on SCr of 0.56 mg/dL).    Assessment: Pharmacy consulted for heparin drip dosing and monitoring in 31 yo female with palpitations and Elevated trop 0.24   Goal of Therapy:  Heparin level 0.3-0.7 units/ml Monitor platelets by anticoagulation protocol: Yes   Plan:  Baseline labs ordered Give 4000 units bolus x 1 Start heparin infusion at 750 units/hr Check anti-Xa level in 6 hours and daily while on heparin Continue to monitor H&H and platelets  Gardner Candle, PharmD, BCPS Clinical Pharmacist 09/18/2017 5:37 PM

## 2017-09-18 NOTE — Progress Notes (Addendum)
ANTICOAGULATION CONSULT NOTE - Initial Consult  Pharmacy Consult for heparin drip Indication: chest pain/ACS  No Known Allergies  Patient Measurements: Height: 5\' 6"  (167.6 cm) Weight: 146 lb (66.2 kg) IBW/kg (Calculated) : 59.3 Heparin Dosing Weight: 66 kg  Vital Signs: Temp: 98.6 F (37 C) (12/12 1845) Temp Source: Oral (12/12 1518) BP: 150/101 (12/12 1845) Pulse Rate: 103 (12/12 1845)  Labs: Recent Labs    09/18/17 1512 09/18/17 1737 09/18/17 1902  HGB 12.8  --   --   HCT 38.7  --   --   PLT 199  --   --   APTT  --  32  --   LABPROT  --  12.2  --   INR  --  0.91  --   CREATININE 0.56  --   --   TROPONINI 0.24*  --  0.76*    Estimated Creatinine Clearance: 97.1 mL/min (by C-G formula based on SCr of 0.56 mg/dL).   Medical History: Past Medical History:  Diagnosis Date  . Sciatica     Medications:  No anticoagulation in PTA meds  Assessment: Trop 0.76  Goal of Therapy:  Heparin level 0.3-0.7 units/ml Monitor platelets by anticoagulation protocol: Yes   Plan:  4000 unit bolus and initial rate of 800 units/hr. First heparin level 6 hours after start of infusion.  12/13 0430 heparin level 0.32. Continue current regimen. Recheck heparin level in 6 hours to confirm.   Fulton Reek, PharmD, BCPS  09/19/17 5:39 AM

## 2017-09-18 NOTE — ED Notes (Signed)
Elevated trop 0.24 reported to Dr Roxan Hockey via phone . Orders to add UDS.

## 2017-09-18 NOTE — ED Notes (Signed)
Dr. Patel at bedside 

## 2017-09-19 ENCOUNTER — Encounter: Payer: Self-pay | Admitting: Nurse Practitioner

## 2017-09-19 DIAGNOSIS — F19980 Other psychoactive substance use, unspecified with psychoactive substance-induced anxiety disorder: Secondary | ICD-10-CM

## 2017-09-19 DIAGNOSIS — R000 Tachycardia, unspecified: Principal | ICD-10-CM

## 2017-09-19 DIAGNOSIS — R748 Abnormal levels of other serum enzymes: Secondary | ICD-10-CM

## 2017-09-19 DIAGNOSIS — F1410 Cocaine abuse, uncomplicated: Secondary | ICD-10-CM

## 2017-09-19 DIAGNOSIS — F1910 Other psychoactive substance abuse, uncomplicated: Secondary | ICD-10-CM

## 2017-09-19 DIAGNOSIS — F1510 Other stimulant abuse, uncomplicated: Secondary | ICD-10-CM

## 2017-09-19 DIAGNOSIS — R079 Chest pain, unspecified: Secondary | ICD-10-CM

## 2017-09-19 LAB — URINE DRUG SCREEN, QUALITATIVE (ARMC ONLY)
AMPHETAMINES, UR SCREEN: POSITIVE — AB
Barbiturates, Ur Screen: NOT DETECTED
Benzodiazepine, Ur Scrn: POSITIVE — AB
COCAINE METABOLITE, UR ~~LOC~~: NOT DETECTED
Cannabinoid 50 Ng, Ur ~~LOC~~: POSITIVE — AB
MDMA (ECSTASY) UR SCREEN: NOT DETECTED
METHADONE SCREEN, URINE: NOT DETECTED
OPIATE, UR SCREEN: POSITIVE — AB
PHENCYCLIDINE (PCP) UR S: NOT DETECTED
Tricyclic, Ur Screen: NOT DETECTED

## 2017-09-19 LAB — BASIC METABOLIC PANEL
Anion gap: 6 (ref 5–15)
CALCIUM: 8.8 mg/dL — AB (ref 8.9–10.3)
CHLORIDE: 105 mmol/L (ref 101–111)
CO2: 24 mmol/L (ref 22–32)
CREATININE: 0.52 mg/dL (ref 0.44–1.00)
GFR calc Af Amer: >60 mL/min (ref >60)
GFR calc non Af Amer: >60 mL/min (ref >60)
Glucose, Bld: 101 mg/dL — ABNORMAL HIGH (ref 65–99)
Potassium: 3.7 mmol/L (ref 3.5–5.1)
SODIUM: 135 mmol/L (ref 135–145)

## 2017-09-19 LAB — TROPONIN I
TROPONIN I: 0.92 ng/mL — AB (ref <0.03)
Troponin I: 0.64 ng/mL (ref <0.03)

## 2017-09-19 LAB — ECHOCARDIOGRAM COMPLETE
HEIGHTINCHES: 66 in
WEIGHTICAEL: 2336 [oz_av]

## 2017-09-19 LAB — CBC
HEMATOCRIT: 40.4 % (ref 35.0–47.0)
HEMOGLOBIN: 13.3 g/dL (ref 12.0–16.0)
MCH: 27.9 pg (ref 26.0–34.0)
MCHC: 32.9 g/dL (ref 32.0–36.0)
MCV: 84.8 fL (ref 80.0–100.0)
Platelets: 202 10*3/uL (ref 150–440)
RBC: 4.76 MIL/uL (ref 3.80–5.20)
RDW: 13.7 % (ref 11.5–14.5)
WBC: 10.2 10*3/uL (ref 3.6–11.0)

## 2017-09-19 LAB — HEPARIN LEVEL (UNFRACTIONATED): Heparin Unfractionated: 0.32 IU/mL (ref 0.30–0.70)

## 2017-09-19 LAB — FIBRIN DERIVATIVES D-DIMER (ARMC ONLY): Fibrin derivatives D-dimer (ARMC): 487.64 ng/mL (FEU) (ref 0.00–499.00)

## 2017-09-19 MED ORDER — SODIUM CHLORIDE 0.9 % IV SOLN
Freq: Once | INTRAVENOUS | Status: AC
  Administered 2017-09-19: 18:00:00 via INTRAVENOUS

## 2017-09-19 MED ORDER — HYDROXYZINE HCL 25 MG PO TABS
50.0000 mg | ORAL_TABLET | Freq: Three times a day (TID) | ORAL | Status: DC | PRN
  Administered 2017-09-19 (×2): 50 mg via ORAL
  Filled 2017-09-19 (×2): qty 2

## 2017-09-19 NOTE — Consult Note (Signed)
[place] [facility] Consult   Reason for Consult: Consult for 31 year old woman with a history of substance abuse.  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 Amphetamine abuse ([place]) [F15.10] 09/19/2017  . 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 Elevated troponin [R74.8] 09/18/2017  . Indication for care in labor and delivery, antepartum [O75.9] 07/17/2016  . Labor and delivery indication for care or intervention [O75.9] 07/17/2016  . Abdominal pain affecting pregnancy [O26.899, R10.9] 07/13/2016  . Irregular uterine contractions [O62.2] 07/08/2016    Total Time spent with patient: 1 hour  Subjective:   Carolyn Cox is a 31 y.o. female patient admitted with "I am glad you are here because I need help with a program".  HPI: This is a 31 year old woman who presented to the emergency room with dizziness and palpitations.  When she first came in she denied that she had been using any substances at all.  Since being in the hospital there has been one incident that has caused staff to be suspicious that she is trying to inject drugs into her intravenous line.  She has had repeated drug screens that are positive.  On interview today the patient tells me a different story.  She tells me that she used intravenous methamphetamine exactly one time.  She said that she had not used any drugs at all since getting out of jail about a week ago.  Prior to that she had been in jail for 3 months.  She says that against her better judgment she used amphetamine one time.  When she did it she thought it was cocaine.  She developed tachycardia and his continued to be tachycardic since then.   Patient claims that she is not using any other drugs although she admits she smokes marijuana regularly.  Denies any alcohol use.  Denies any other mental health symptoms.  Not reporting depression.  Not reporting any suicidal or homicidal thoughts.  She does say she could not sleep last night and has not slept well for a couple of days.  Patient denies having any hallucinations whatsoever.  I am not sure what that question in the consult referred to.  There is documentation in the chart and I spoke to the on-duty nurse who reported an incident in which white powder was found in the IV line of the patient after the patient had been left alone briefly.  Patient had denied absolutely tampering with or injecting anything into her IV.  Social history: She says she is currently staying with her parents.  Not working.  Just got out of jail after being there for 3 months.  Has 2 children neither of whom live with her.  Medical history: Patient has a history of sciatica and frequent complaints about pain.  Substance abuse history: The patient tells me that she had been using intravenous cocaine for several months prior to going to jail.  Otherwise she smokes marijuana and denies any other drug abuse.  He says that she has never been in any kind of substance abuse treatment at all.    Past Psychiatric History: Denies any psychiatric treatment.  No hospitalization.  Denies any history of suicide attempts or violence.  Denies ever being on any psychiatric  medicine.  Risk to Self: Is patient at risk for suicide?: No Risk to Others:   Prior Inpatient Therapy:   Prior Outpatient Therapy:    Past Medical History:  Past Medical History:  Diagnosis Date  . Polysubstance abuse ([place])   . Sciatica     Past Surgical History:  Procedure Laterality Date  . MANDIBLE FRACTURE SURGERY     Family History:  Family History  Problem Relation Age of Onset  . Hypertension Mother    Family Psychiatric  History:  Denies any family history Social History:  Social History   Substance and Sexual Activity  Alcohol Use Yes   Comment: occasional drink - few x/wk.     Social History   Substance and Sexual Activity  Drug Use Yes  . Types: Cocaine, Marijuana, Amphetamines   Comment: Says she last used cocaine 4 mos ago.  Smoke MJ most days of the week.  Denies amphetamines but UDS+ 09/18/2017.    Social History   Socioeconomic History  . Marital status: Single    Spouse name: None  . Number of children: None  . Years of education: None  . Highest education level: None  Social Needs  . Financial resource strain: None  . Food insecurity - worry: None  . Food insecurity - inability: None  . Transportation needs - medical: None  . Transportation needs - non-medical: None  Occupational History  . Occupation: Waitress  Tobacco Use  . Smoking status: Current Some Day Smoker    Types: Cigarettes  . Smokeless tobacco: Never Used  . Tobacco comment: less than 1 pack/wk.  Substance and Sexual Activity  . Alcohol use: Yes    Comment: occasional drink - few x/wk.  . Drug use: Yes    Types: Cocaine, Marijuana, Amphetamines    Comment: Says she last used cocaine 4 mos ago.  Smoke MJ most days of the week.  Denies amphetamines but UDS+ 09/18/2017.  Marland Kitchen Sexual activity: Yes  Other Topics Concern  . None  Social History Narrative   Lives in [place] with two children.  Works as Educational psychologist.  Does not routinely exercise.       Additional Social History:    Allergies:  No Known Allergies  Labs:  Results for orders placed or performed during the hospital encounter of 09/18/17 (from the past 48 hour(s))  Basic metabolic panel     Status: Abnormal   Collection Time: 09/18/17  3:12 PM  Result Value Ref Range   Sodium 136 135 - 145 mmol/L   Potassium 3.2 (L) 3.5 - 5.1 mmol/L   Chloride 103 101 - 111 mmol/L   CO2 24 22 - 32 mmol/L   Glucose, Bld 110 (H) 65 - 99 mg/dL   BUN 6 6 - 20 mg/dL   Creatinine,  Ser 0.56 0.44 - 1.00 mg/dL   Calcium 9.1 8.9 - 10.3 mg/dL   GFR calc non Af Amer >60 >60 mL/min   GFR calc Af Amer >60 >60 mL/min    Comment: (NOTE) The eGFR has been calculated using the CKD EPI equation. This calculation has not been validated in all clinical situations. eGFR's persistently <60 mL/min signify possible Chronic Kidney Disease.    Anion gap 9 5 - 15  CBC     Status: None   Collection Time: 09/18/17  3:12 PM  Result Value Ref Range   WBC 9.0 3.6 - 11.0 K/uL   RBC 4.58 3.80 - 5.20 MIL/uL   Hemoglobin 12.8 12.0 -  16.0 g/dL   HCT 38.7 35.0 - 47.0 %   MCV 84.5 80.0 - 100.0 fL   MCH 27.9 26.0 - 34.0 pg   MCHC 33.0 32.0 - 36.0 g/dL   RDW 13.6 11.5 - 14.5 %   Platelets 199 150 - 440 K/uL  Troponin I     Status: Abnormal   Collection Time: 09/18/17  3:12 PM  Result Value Ref Range   Troponin I 0.24 (HH) <0.03 ng/mL    Comment: CRITICAL RESULT CALLED TO, READ BACK BY AND VERIFIED WITH MONICA MOON 09/18/17 1600 KLW   Urine Drug Screen, Qualitative ([place] only)     Status: Abnormal   Collection Time: 09/18/17  3:12 PM  Result Value Ref Range   Tricyclic, Ur Screen NONE DETECTED NONE DETECTED   Amphetamines, Ur Screen POSITIVE (A) NONE DETECTED   MDMA (Ecstasy)Ur Screen NONE DETECTED NONE DETECTED   Cocaine Metabolite,Ur [place] NONE DETECTED NONE DETECTED   Opiate, Ur Screen NONE DETECTED NONE DETECTED   Phencyclidine (PCP) Ur S NONE DETECTED NONE DETECTED   Cannabinoid 50 Ng, Ur [place] POSITIVE (A) NONE DETECTED   Barbiturates, Ur Screen NONE DETECTED NONE DETECTED   Benzodiazepine, Ur Scrn NONE DETECTED NONE DETECTED   Methadone Scn, Ur NONE DETECTED NONE DETECTED    Comment: (NOTE) 283  Tricyclics, urine               Cutoff 1000 ng/mL 200  Amphetamines, urine             Cutoff 1000 ng/mL 300  MDMA (Ecstasy), urine           Cutoff 500 ng/mL 400  Cocaine Metabolite, urine       Cutoff 300 ng/mL 500  Opiate, urine                   Cutoff 300 ng/mL 600  Phencyclidine  (PCP), urine      Cutoff 25 ng/mL 700  Cannabinoid, urine              Cutoff 50 ng/mL 800  Barbiturates, urine             Cutoff 200 ng/mL 900  Benzodiazepine, urine           Cutoff 200 ng/mL 1000 Methadone, urine                Cutoff 300 ng/mL 1100 1200 The urine drug screen provides only a preliminary, unconfirmed 1300 analytical test result and should not be used for non-medical 1400 purposes. Clinical consideration and professional judgment should 1500 be applied to any positive drug screen result due to possible 1600 interfering substances. A more specific alternate chemical method 1700 must be used in order to obtain a confirmed analytical result.  1800 Gas chromato graphy / mass spectrometry (GC/MS) is the preferred 1900 confirmatory method.   Urinalysis, Complete w Microscopic     Status: Abnormal   Collection Time: 09/18/17  3:12 PM  Result Value Ref Range   Color, Urine YELLOW (A) YELLOW   APPearance CLEAR (A) CLEAR   Specific Gravity, Urine 1.017 1.005 - 1.030   pH 6.0 5.0 - 8.0   Glucose, UA NEGATIVE NEGATIVE mg/dL   Hgb urine dipstick NEGATIVE NEGATIVE   Bilirubin Urine NEGATIVE NEGATIVE   Ketones, ur 5 (A) NEGATIVE mg/dL   Protein, ur NEGATIVE NEGATIVE mg/dL   Nitrite NEGATIVE NEGATIVE   Leukocytes, UA NEGATIVE NEGATIVE   RBC / HPF 0-5 0 - 5 RBC/hpf  WBC, UA 0-5 0 - 5 WBC/hpf   Bacteria, UA NONE SEEN NONE SEEN   Squamous Epithelial / LPF 0-5 (A) NONE SEEN   Mucus PRESENT   Magnesium     Status: None   Collection Time: 09/18/17  3:12 PM  Result Value Ref Range   Magnesium 1.8 1.7 - 2.4 mg/dL  Pregnancy, urine POC     Status: None   Collection Time: 09/18/17  3:43 PM  Result Value Ref Range   Preg Test, Ur NEGATIVE NEGATIVE    Comment:        THE SENSITIVITY OF THIS METHODOLOGY IS >24 mIU/mL   Protime-INR     Status: None   Collection Time: 09/18/17  5:37 PM  Result Value Ref Range   Prothrombin Time 12.2 11.4 - 15.2 seconds   INR 0.91   APTT      Status: None   Collection Time: 09/18/17  5:37 PM  Result Value Ref Range   aPTT 32 24 - 36 seconds  Troponin I     Status: Abnormal   Collection Time: 09/18/17  7:02 PM  Result Value Ref Range   Troponin I 0.76 (HH) <0.03 ng/mL    Comment: CRITICAL VALUE NOTED. VALUE IS CONSISTENT WITH PREVIOUSLY REPORTED/CALLED VALUE KLW  TSH     Status: Abnormal   Collection Time: 09/18/17  7:02 PM  Result Value Ref Range   TSH 0.322 (L) 0.350 - 4.500 uIU/mL    Comment: Performed by a 3rd Generation assay with a functional sensitivity of <=0.01 uIU/mL.  Troponin I     Status: Abnormal   Collection Time: 09/19/17 12:32 AM  Result Value Ref Range   Troponin I 0.92 (HH) <0.03 ng/mL    Comment: CRITICAL VALUE NOTED. VALUE IS CONSISTENT WITH PREVIOUSLY REPORTED/CALLED VALUE. JAG  Heparin level (unfractionated)     Status: None   Collection Time: 09/19/17  4:37 AM  Result Value Ref Range   Heparin Unfractionated 0.32 0.30 - 0.70 IU/mL    Comment:        IF HEPARIN RESULTS ARE BELOW EXPECTED VALUES, AND PATIENT DOSAGE HAS BEEN CONFIRMED, SUGGEST FOLLOW UP TESTING OF ANTITHROMBIN III LEVELS.   Troponin I     Status: Abnormal   Collection Time: 09/19/17  6:26 AM  Result Value Ref Range   Troponin I 0.64 (HH) <0.03 ng/mL    Comment: CRITICAL VALUE NOTED. VALUE IS CONSISTENT WITH PREVIOUSLY REPORTED/CALLED VALUE. QSD  CBC     Status: None   Collection Time: 09/19/17  6:26 AM  Result Value Ref Range   WBC 10.2 3.6 - 11.0 K/uL   RBC 4.76 3.80 - 5.20 MIL/uL   Hemoglobin 13.3 12.0 - 16.0 g/dL   HCT 40.4 35.0 - 47.0 %   MCV 84.8 80.0 - 100.0 fL   MCH 27.9 26.0 - 34.0 pg   MCHC 32.9 32.0 - 36.0 g/dL   RDW 13.7 11.5 - 14.5 %   Platelets 202 150 - 440 K/uL  Basic metabolic panel     Status: Abnormal   Collection Time: 09/19/17  6:26 AM  Result Value Ref Range   Sodium 135 135 - 145 mmol/L   Potassium 3.7 3.5 - 5.1 mmol/L   Chloride 105 101 - 111 mmol/L   CO2 24 22 - 32 mmol/L   Glucose, Bld 101  (H) 65 - 99 mg/dL   BUN <5 (L) 6 - 20 mg/dL   Creatinine, Ser 0.52 0.44 - 1.00 mg/dL   Calcium  8.8 (L) 8.9 - 10.3 mg/dL   GFR calc non Af Amer >60 >60 mL/min   GFR calc Af Amer >60 >60 mL/min    Comment: (NOTE) The eGFR has been calculated using the CKD EPI equation. This calculation has not been validated in all clinical situations. eGFR's persistently <60 mL/min signify possible Chronic Kidney Disease.    Anion gap 6 5 - 15  Urine Drug Screen, Qualitative ([facility] only)     Status: Abnormal   Collection Time: 09/19/17 12:00 PM  Result Value Ref Range   Tricyclic, Ur Screen NONE DETECTED NONE DETECTED   Amphetamines, Ur Screen POSITIVE (A) NONE DETECTED   MDMA (Ecstasy)Ur Screen NONE DETECTED NONE DETECTED   Cocaine Metabolite,Ur [place] NONE DETECTED NONE DETECTED   Opiate, Ur Screen POSITIVE (A) NONE DETECTED   Phencyclidine (PCP) Ur S NONE DETECTED NONE DETECTED   Cannabinoid 50 Ng, Ur [place] POSITIVE (A) NONE DETECTED   Barbiturates, Ur Screen NONE DETECTED NONE DETECTED   Benzodiazepine, Ur Scrn POSITIVE (A) NONE DETECTED   Methadone Scn, Ur NONE DETECTED NONE DETECTED    Comment: (NOTE) 711  Tricyclics, urine               Cutoff 1000 ng/mL 200  Amphetamines, urine             Cutoff 1000 ng/mL 300  MDMA (Ecstasy), urine           Cutoff 500 ng/mL 400  Cocaine Metabolite, urine       Cutoff 300 ng/mL 500  Opiate, urine                   Cutoff 300 ng/mL 600  Phencyclidine (PCP), urine      Cutoff 25 ng/mL 700  Cannabinoid, urine              Cutoff 50 ng/mL 800  Barbiturates, urine             Cutoff 200 ng/mL 900  Benzodiazepine, urine           Cutoff 200 ng/mL 1000 Methadone, urine                Cutoff 300 ng/mL 1100 1200 The urine drug screen provides only a preliminary, unconfirmed 1300 analytical test result and should not be used for non-medical 1400 purposes. Clinical consideration and professional judgment should 1500 be applied to any positive drug screen result due to  possible 1600 interfering substances. A more specific alternate chemical method 1700 must be used in order to obtain a confirmed analytical result.  1800 Gas chromato graphy / mass spectrometry (GC/MS) is the preferred 1900 confirmatory method.   Fibrin derivatives D-Dimer ([facility] only)     Status: None   Collection Time: 09/19/17  2:57 PM  Result Value Ref Range   Fibrin derivatives D-dimer ([facility]) 487.64 0.00 - 499.00 ng/mL (FEU)    Comment: (NOTE) <> Exclusion of Venous Thromboembolism (VTE) - OUTPATIENT ONLY   (Emergency Department or [place])   0-499 ng/ml (FEU): With a low to intermediate pretest probability                      for VTE this test result excludes the diagnosis                      of VTE.   >499 ng/ml (FEU) : VTE not excluded; additional work up for VTE is  required. <> Testing on Inpatients and Evaluation of Disseminated Intravascular   Coagulation ([facility]IC) Reference Range:   0-499 ng/ml (FEU)     Current Facility-Administered Medications  Medication Dose Route Frequency Provider Last Rate Last Dose  . 0.9 %  sodium chloride infusion   Intravenous Continuous Dustin Flock, MD   Stopped at 09/19/17 1801  . acetaminophen (TYLENOL) tablet 650 mg  650 mg Oral Q6H PRN Dustin Flock, MD   650 mg at 09/18/17 2131   Or  . acetaminophen (TYLENOL) suppository 650 mg  650 mg Rectal Q6H PRN Dustin Flock, MD      . aspirin EC tablet 81 mg  81 mg Oral Daily Dustin Flock, MD   81 mg at 09/19/17 1008  . HYDROcodone-acetaminophen (NORCO/VICODIN) 5-325 MG per tablet 1-2 tablet  1-2 tablet Oral Q4H PRN Dustin Flock, MD   2 tablet at 09/19/17 0807  . hydrOXYzine (ATARAX/VISTARIL) tablet 50 mg  50 mg Oral TID PRN Lance Coon, MD   50 mg at 09/19/17 0118  . ketorolac (TORADOL) 15 MG/ML injection 15 mg  15 mg Intravenous Q6H PRN Dustin Flock, MD   15 mg at 09/19/17 0538  . nicotine (NICODERM CQ - dosed in mg/24 hours) patch 14 mg  14 mg Transdermal  Daily Lance Coon, MD   14 mg at 09/19/17 1008  . ondansetron (ZOFRAN) tablet 4 mg  4 mg Oral Q6H PRN Dustin Flock, MD       Or  . ondansetron [facility]) injection 4 mg  4 mg Intravenous Q6H PRN Dustin Flock, MD   4 mg at 09/19/17 0118    Musculoskeletal: Strength & Muscle Tone: within normal limits Gait & Station: normal Patient leans: N/A  Psychiatric Specialty Exam: Physical Exam  Nursing note and vitals reviewed. Constitutional: She appears well-developed and well-nourished.  HENT:  Head: Normocephalic and atraumatic.  Eyes: Conjunctivae are normal. Pupils are equal, round, and reactive to light.  Neck: Normal range of motion.  Cardiovascular: Regular rhythm and normal heart sounds.  Respiratory: Effort normal. No respiratory distress.  GI: Soft.  Musculoskeletal: Normal range of motion.  Neurological: She is alert.  Skin: Skin is warm and dry.  Psychiatric: She has a normal mood and affect. Her speech is normal and behavior is normal. Thought content normal. Cognition and memory are normal. She expresses impulsivity.    Review of Systems  Constitutional: Negative.   HENT: Negative.   Eyes: Negative.   Respiratory: Negative.   Cardiovascular: Positive for palpitations.  Gastrointestinal: Negative.   Musculoskeletal: Negative.   Skin: Negative.   Neurological: Positive for dizziness.  Psychiatric/Behavioral: Positive for substance abuse. Negative for depression, hallucinations, memory loss and suicidal ideas. The patient has insomnia. The patient is not nervous/anxious.     Blood pressure (!) 96/59, pulse (!) 114, temperature 98.5 F (36.9 C), temperature source Oral, resp. rate 18, height '5\' 6"'$  (1.676 m), weight 66.2 kg (146 lb), last menstrual period 09/11/2017, SpO2 100 %, not currently breastfeeding.Body mass index is 23.57 kg/m.  General Appearance: Fairly Groomed  Eye Contact:  Good  Speech:  Clear and Coherent  Volume:  Normal  Mood:  Euthymic  Affect:   Congruent  Thought Process:  Goal Directed  Orientation:  Full (Time, Place, and Person)  Thought Content:  Logical  Suicidal Thoughts:  No  Homicidal Thoughts:  No  Memory:  Immediate;   Fair Recent;   Fair Remote;   Fair  Judgement:  Impaired  Insight:  Shallow  Psychomotor  Activity:  Decreased  Concentration:  Concentration: Fair  Recall:  [employer] of Knowledge:  Fair  Language:  Fair  Akathisia:  No  Handed:  Right  AIMS (if indicated):     Assets:  Desire for Improvement Physical Health  ADL's:  Intact  Cognition:  WNL  Sleep:        Treatment Plan Summary: Plan 31 year old woman who is now admitting to having used methamphetamine one time.  Drug screen on first admission was positive for amphetamine.  Follow-up drug screen was also positive for amphetamine.  Drug screens do not necessarily prove one way or the other whether she had been taking more drugs since coming into the hospital.  I note looking back in her chart that the patient has a history of arrest and jail time for substance abuse.  She has a history of concealing drugs in her vagina resulting in medical problems in the past.  The story she is telling me completely contradicts what she told the emergency room doctor.  In short I think that her reliability is questionable.  There does not appear to be any indication for psychiatric medicine or psychiatric hospitalization but I would endorse continuing the sitter during her hospital stay to try and prevent any repeat abuse of her IV line.  Patient is requesting referral to outpatient treatment.  She actually first requested specifically referral to an inpatient treatment program and I explained to her that that would not be the necessary first step.  I have asked social work to provide information about [facility] and local resources.  I will follow up as needed.  Disposition: Patient does not meet criteria for psychiatric inpatient admission. Supportive therapy provided  about ongoing stressors. Discussed crisis plan, support from social network, calling 911, coming to the Emergency Department, and calling Suicide Hotline.  Alethia Berthold, MD 09/19/2017 6:18 PM

## 2017-09-19 NOTE — Progress Notes (Signed)
Pt requesting to go off the floor and smoke. Informed Pt that this is a smoke free facility and that I could order a nicotine patch. Pt agreed.

## 2017-09-19 NOTE — Consult Note (Signed)
Cardiology Consult    Patient ID: Carolyn Cox MRN: [id number], DOB/AGE: 06-Jul-1986   Admit date: 09/18/2017 Date of Consult: 09/19/2017  Primary Physician: Mickey Farberhies, David, MD Primary Cardiologist: Julien Nordmannimothy Gollan, MD Requesting Provider: Elveria RoyalsA. Gouru  Patient Profile    Carolyn ManKrystal Lukehart is a 31 y.o. female with a history of polysubstance abuse including cocaine, marijuana, and opiates, who is being seen today for the evaluation of sinus tachycardia and troponin elevation at the request of Dr. Amado CoeGouru.  Past Medical History   Past Medical History:  Diagnosis Date  . Polysubstance abuse (HCC)   . Sciatica     Past Surgical History:  Procedure Laterality Date  . MANDIBLE FRACTURE SURGERY       Allergies  No Known Allergies  History of Present Illness    31 y/o ? with a history of polysubstance abuse and sciatica.  She admits to previously injecting cocaine though says she has not used 120 days.  She does smoke marijuana on a regular basis.  She has previously used prescribed opiates.  She has no cardiac history.  She has no family history of premature coronary disease.  She lives in [place] with her 2 children.  She continues to smoke under a pack of cigarettes per week.  She was in her usual state of health until the morning of December 12, when she went out with a friend and had multiple shots of strong cough/espresso.  She says shortly thereafter, she began to feel jittery with discomfort throughout her body.  She was not initially having chest pain but did feel short of breath and at times felt like her heart was racing.  Because of persistent symptoms, she presented to the [facility] emergency department where she was hypertensive at 153/102 and also noted to have runs of narrow complex tachycardia-sinus tachycardia.  Troponin returned elevated at 0.24.  She was admitted and placed on heparin.  Troponin eventually peaked at 0.92 and is now trending down this morning at 0.64.  She  has been noted to have runs of sinus tachycardia especially when getting up to go to the bathroom.  This is been associated with dyspnea.  Heart rates climb into the 170s before returning back to the low 100s at rest.  We have been asked to evaluate.  She denies chest pain, PND, orthopnea, dizziness, syncope, edema, or early satiety.  Urine drug screen was positive for marijuana and amphetamines.  She denies any usage of amphetamines did have a cold recently and was using over-the-counter cold preparations including Mucinex.  She is not sure if she was taking any ephedra-based products.  Of note, nursing reports that they were called to the patient's bedside for an occlusion of the IV.  Prior to flushing the IV, a white powdery substance was noted on the IV port.  Patient was questioned by nursing as to whether or not she injected something in the port and patient denied.  Inpatient Medications    . aspirin EC  81 mg Oral Daily  . nicotine  14 mg Transdermal Daily    Family History    Family History  Problem Relation Age of Onset  . Hypertension Mother     Social History    Social History   Socioeconomic History  . Marital status: Single    Spouse name: Not on file  . Number of children: Not on file  . Years of education: Not on file  . Highest education level: Not on file  Social  Needs  . Financial resource strain: Not on file  . Food insecurity - worry: Not on file  . Food insecurity - inability: Not on file  . Transportation needs - medical: Not on file  . Transportation needs - non-medical: Not on file  Occupational History  . Occupation: Waitress  Tobacco Use  . Smoking status: Current Some Day Smoker    Types: Cigarettes  . Smokeless tobacco: Never Used  . Tobacco comment: less than 1 pack/wk.  Substance and Sexual Activity  . Alcohol use: Yes    Comment: occasional drink - few x/wk.  . Drug use: Yes    Types: Cocaine, Marijuana, Amphetamines    Comment: Says she  last used cocaine 4 mos ago.  Smoke MJ most days of the week.  Denies amphetamines but UDS+ 09/18/2017.  Marland Kitchen Sexual activity: Yes  Other Topics Concern  . Not on file  Social History Narrative   Lives in [place] with two children.  Works as Child psychotherapist.  Does not routinely exercise.         Review of Systems    General:  Feels anxious/jittery.  No chills, fever, night sweats or weight changes.  Cardiovascular:  No chest pain, +++dyspnea and palpitations.  No edema, orthopnea, palpitations, paroxysmal nocturnal dyspnea. Dermatological: No rash, lesions/masses Respiratory: No cough, dyspnea Urologic: No hematuria, dysuria Abdominal:   No nausea, vomiting, diarrhea, bright red blood per rectum, melena, or hematemesis Neurologic:  No visual changes, wkns, changes in mental status. All other systems reviewed and are otherwise negative except as noted above.  Physical Exam    Blood pressure 114/72, pulse (!) 109, temperature 98.5 F (36.9 C), temperature source Oral, resp. rate 18, height 5\' 6"  (1.676 m), weight 146 lb (66.2 kg), last menstrual period 09/11/2017, SpO2 100 %, not currently breastfeeding.  General: Pleasant, NAD Psych: Normal affect. Neuro: Alert and oriented X 3. Moves all extremities spontaneously. HEENT: Normal  Neck: Supple without bruits or JVD. Lungs:  Resp regular and unlabored, CTA. Heart: RRR, tachycardic, no s3, s4, or murmurs. Abdomen: Soft, non-tender, non-distended, BS + x 4.  Extremities: No clubbing, cyanosis or edema. DP/PT/Radials 2+ and equal bilaterally.  Labs     Recent Labs    09/18/17 1512 09/18/17 1902 09/19/17 0032 09/19/17 0626  TROPONINI 0.24* 0.76* 0.92* 0.64*   Lab Results  Component Value Date   WBC 10.2 09/19/2017   HGB 13.3 09/19/2017   HCT 40.4 09/19/2017   MCV 84.8 09/19/2017   PLT 202 09/19/2017    Recent Labs  Lab 09/19/17 0626  NA 135  K 3.7  CL 105  CO2 24  BUN <5*  CREATININE 0.52  CALCIUM 8.8*  GLUCOSE 101*      Lab Results  Component Value Date   TSH 0.322 (L) 09/18/2017    Radiology Studies    Dg Chest 2 View  Result Date: 09/18/2017 CLINICAL DATA:  Palpitations, tachycardia. Onset after ingesting coughing this morning. First time occurrence. EXAM: CHEST  2 VIEW COMPARISON:  Chest x-ray of May 22, 2017. FINDINGS: The lungs are hyperinflated with mild hemidiaphragm flattening. There is no focal infiltrate. There is no pleural effusion or pneumothorax. The heart and pulmonary vascularity are normal. The mediastinum is normal in width. There is gentle curvature of the midthoracic spine convex toward the right at appears positional. IMPRESSION: Mild hyperinflation may be voluntary or may reflect underlying reactive airway disease and the patient's smoking history. No acute cardiopulmonary abnormality is observed. Electronically Signed  By: David  [place] M.D.   On: 09/18/2017 16:00    ECG & Cardiac Imaging    Sinus arrhythmia, 85, LVH  Assessment & Plan    1.  Elevated troponin: Patient admitted to [facility] on December 12 with complaints of palpitations and feeling jittery in the absence of chest discomfort.  She was having some dyspnea.  She was noted to have an elevated troponin which subsequently peaked at 0.92 and is now trending down.  ECG nonacute.  She has been having intermittent sinus tachycardia, most often associated with getting up and going to the bathroom.  Urine drug screen positive for marijuana and amphetamines.  She admits to smoking marijuana but denies using amphetamines.  She was recently treating herself with over-the-counter cold remedies however.  She denies chest pain.  Doubt ACS.  Question myocarditis/pericarditis in the setting of recent presumed upper respiratory viral illness.  Echocardiogram pending.  Further recommendations following echo.  2.  Sinus tachycardia: Patient with intermittent sinus tachycardia and palpitations.  Currently occurring when she gets up  to go to the bathroom and rates rising into the 160s and 170s.  This has been associated with dyspnea.  Echo pending.  TSH is mildly suppressed at 0.322.  Recommend further evaluation for possible hyperthyroidism.  Check d-dimer.  Urine drug screen positive for amphetamines though she denies using.  Check orthostatics.  Could consider using beta-blocker pending repeat drug screen though likely not ideal given cocaine usage history.  3.  Polysubstance abuse: Patient admits to previously injecting cocaine but says she has not used in the 120 days.  She smokes marijuana regularly.  As above, the injection positive for amphetamines.  Patient found by nursing to have occlusion of her IV line.  There was a white powdery substance noted at the port of the IV line however patient denied trying to put anything into the IV.  Repeat urine drug screen pending.  Signed, Nicolasa Ducking, NP 09/19/2017, 12:36 PM  For questions or updates, please contact   Please consult [email or web address] for contact info under Cardiology/STEMI.

## 2017-09-19 NOTE — Plan of Care (Signed)
Pt continuing to get up and move around the room. Trying to reinforce for pt's safety needs not to move around as much because hr continues to be elevated.

## 2017-09-19 NOTE — Progress Notes (Signed)
Soft BP, light headed, "jittery" palpitations. Dr. Amado Coe notified - order for 1L bolus NS.

## 2017-09-19 NOTE — Progress Notes (Signed)
Notified MD of tis of 0.92. No new orders at this time. Continuing to monitor.

## 2017-09-19 NOTE — Progress Notes (Addendum)
Patient's IV started beeping after returning from bathroom. This RN went to check line, prior to flushing RN saw white powder in tubing. Disconnected tubing and moderate amount of white powder squirted out (entire tubing set was changed). Patient denied touching her IV. Sitter stated that patient had asked for a minute of privacy in the bathroom to place a tampon. Sitter reminded that patient needs to monitored. Heart rate goes to 180 with ambulation, will try bedside commode next time. Dr. Mariah Milling and Dr. Amado Coe aware. Orders for another urine drug screen. Will continue to monitor.   Neurosurgeon, Thurston Hole, notified as well as security.

## 2017-09-19 NOTE — Progress Notes (Signed)
Notified MD of Tis of 0.74, and pt requesting something for anxiety, and nicotine patch. Orders placed. Will continue to monitor and assess

## 2017-09-19 NOTE — Progress Notes (Signed)
[facility] - Manuel Garcia at [facility]   PATIENT NAME: Carolyn ManKrystal Cox    MR#:  [id number]  DATE OF BIRTH:  Mar 16, 1986  SUBJECTIVE:  CHIEF COMPLAINT:  Patient is having palpitations. Admits polysubstance abuse. Has fresh needle track marks on her bilateral extremities. Denies any chest tightness or shortness of breath  REVIEW OF SYSTEMS:  CONSTITUTIONAL: No fever, fatigue or weakness.  EYES: No blurred or double vision.  EARS, NOSE, AND THROAT: No tinnitus or ear pain.  RESPIRATORY: No cough, shortness of breath, wheezing or hemoptysis.  CARDIOVASCULAR: No chest pain, orthopnea, edema. Reporting palpitations GASTROINTESTINAL: No nausea, vomiting, diarrhea or abdominal pain.  GENITOURINARY: No dysuria, hematuria.  ENDOCRINE: No polyuria, nocturia,  HEMATOLOGY: No anemia, easy bruising or bleeding SKIN: No rash or lesion. MUSCULOSKELETAL: No joint pain or arthritis.   NEUROLOGIC: No tingling, numbness, weakness.  PSYCHIATRY: No anxiety or depression.   DRUG ALLERGIES:  No Known Allergies  VITALS:  Blood pressure 114/72, pulse (!) 109, temperature 98.5 F (36.9 C), temperature source Oral, resp. rate 18, height 5\' 6"  (1.676 m), weight 66.2 kg (146 lb), last menstrual period 09/11/2017, SpO2 100 %, not currently breastfeeding.  PHYSICAL EXAMINATION:  GENERAL:  31 y.o.-year-old patient lying in the bed with no acute distress.  EYES: Pupils equal, round, reactive to light and accommodation. No scleral icterus. Extraocular muscles intact.  HEENT: Head atraumatic, normocephalic. Oropharynx and nasopharynx clear.  NECK:  Supple, no jugular venous distention. No thyroid enlargement, no tenderness.  LUNGS: Normal breath sounds bilaterally, no wheezing, rales,rhonchi or crepitation. No use of accessory muscles of respiration.  CARDIOVASCULAR: S1, S2 normal. Tachycardic No murmurs, rubs, or gallops.  ABDOMEN: Soft, nontender, nondistended. Bowel sounds present. No  organomegaly or mass.  EXTREMITIES: Needle track marks were noticed on bilateral upper extremities No pedal edema, cyanosis, or clubbing.  NEUROLOGIC: Cranial nerves II through XII are intact. Muscle strength 5/5 in all extremities. Sensation intact. Gait not checked.  PSYCHIATRIC: The patient is alert and oriented x 3.  SKIN: No obvious rash, lesion, or ulcer.    LABORATORY PANEL:   CBC Recent Labs  Lab 09/19/17 0626  WBC 10.2  HGB 13.3  HCT 40.4  PLT 202   ------------------------------------------------------------------------------------------------------------------  Chemistries  Recent Labs  Lab 09/18/17 1512 09/19/17 0626  NA 136 135  K 3.2* 3.7  CL 103 105  CO2 24 24  GLUCOSE 110* 101*  BUN 6 <5*  CREATININE 0.56 0.52  CALCIUM 9.1 8.8*  MG 1.8  --    ------------------------------------------------------------------------------------------------------------------  Cardiac Enzymes Recent Labs  Lab 09/19/17 0626  TROPONINI 0.64*   ------------------------------------------------------------------------------------------------------------------  RADIOLOGY:  Dg Chest 2 View  Result Date: 09/18/2017 CLINICAL DATA:  Palpitations, tachycardia. Onset after ingesting coughing this morning. First time occurrence. EXAM: CHEST  2 VIEW COMPARISON:  Chest x-ray of May 22, 2017. FINDINGS: The lungs are hyperinflated with mild hemidiaphragm flattening. There is no focal infiltrate. There is no pleural effusion or pneumothorax. The heart and pulmonary vascularity are normal. The mediastinum is normal in width. There is gentle curvature of the midthoracic spine convex toward the right at appears positional. IMPRESSION: Mild hyperinflation may be voluntary or may reflect underlying reactive airway disease and the patient's smoking history. No acute cardiopulmonary abnormality is observed. Electronically Signed   By: David  SwazilandJordan M.D.   On: 09/18/2017 16:00    EKG:    Orders placed or performed during the hospital encounter of 09/18/17  . ED EKG within 10 minutes  .  EKG 12-Lead  . EKG 12-Lead  . ED EKG within 10 minutes    ASSESSMENT AND PLAN:     Patient is a 31 year old presenting with palpitation   #Sinus tachycardia probably secondary to polysubstance abuse Urine drug screen is positive for amphetamines, benzos, marijuana and opiates Cardiology is following Echocardiogram is pending White powdery substance was noticed by RN in her IV line suspected to be infused by the patient, continue close monitoring, sitter at bedside  #  Elevated troponin possibly related to tachycardia Will check echocardiogram patient on aspirin Cardiology is not considering any ischemic workup at this time  #Polysubstance abuse Patient needs drug rehabilitation Psych consult placed  #  Hypokalemia  replace her potassium potassium at 3.7 check magnesium   # Subnormal TSH Check free T4 and T3  . miscellaneous Lovenox for DVT prophylaxis     All the records are reviewed and case discussed with Care Management/Social Workerr. Management plans discussed with the patient, family and they are in agreement.  CODE STATUS: FC   TOTAL TIME TAKING CARE OF THIS PATIENT: 36  minutes.   POSSIBLE D/C IN 1-2  DAYS, DEPENDING ON CLINICAL CONDITION.  Note: This dictation was prepared with Dragon dictation along with smaller phrase technology. Any transcriptional errors that result from this process are unintentional.   Ramonita Lab M.D on 09/19/2017 at 3:39 PM  Between 7am to 6pm - Pager - [phone] After 6pm go to [email or web address] - password [email or web address] [facility]  [facility]  Office  [phone]  CC: Primary care physician; Mickey Farber, MD

## 2017-09-19 NOTE — Progress Notes (Signed)
Ward Givens, NP aware of HR sustaining 130s. No new orders at this time. Will continue to monitor.

## 2017-09-20 ENCOUNTER — Encounter
Admission: EM | Disposition: A | Discharge status: Home / Self Care | Payer: Self-pay | Source: Home / Self Care | Attending: Internal Medicine

## 2017-09-20 DIAGNOSIS — I5021 Acute systolic (congestive) heart failure: Secondary | ICD-10-CM

## 2017-09-20 HISTORY — PX: LEFT HEART CATH AND CORONARY ANGIOGRAPHY: CATH118249

## 2017-09-20 LAB — HIV ANTIBODY (ROUTINE TESTING W REFLEX): HIV Screen 4th Generation wRfx: NONREACTIVE

## 2017-09-20 LAB — T4, FREE: Free T4: 1.15 ng/dL — ABNORMAL HIGH (ref 0.61–1.12)

## 2017-09-20 SURGERY — LEFT HEART CATH AND CORONARY ANGIOGRAPHY
Anesthesia: Moderate Sedation

## 2017-09-20 MED ORDER — SODIUM CHLORIDE 0.9 % IV SOLN: 250.0000 mL | INTRAVENOUS | Status: DC | PRN

## 2017-09-20 MED ORDER — MIDAZOLAM HCL 2 MG/2ML IJ SOLN
INTRAMUSCULAR | Status: DC | PRN
  Administered 2017-09-20: 1 mg via INTRAVENOUS

## 2017-09-20 MED ORDER — FENTANYL CITRATE (PF) 100 MCG/2ML IJ SOLN
INTRAMUSCULAR | Status: DC | PRN
  Administered 2017-09-20: 25 ug via INTRAVENOUS

## 2017-09-20 MED ORDER — MIDAZOLAM HCL 2 MG/2ML IJ SOLN
INTRAMUSCULAR | Status: AC
  Filled 2017-09-20: qty 2

## 2017-09-20 MED ORDER — HEPARIN SODIUM (PORCINE) 1000 UNIT/ML IJ SOLN
INTRAMUSCULAR | Status: AC
  Filled 2017-09-20: qty 1

## 2017-09-20 MED ORDER — VERAPAMIL HCL 2.5 MG/ML IV SOLN
INTRAVENOUS | Status: AC
  Filled 2017-09-20: qty 2

## 2017-09-20 MED ORDER — FENTANYL CITRATE (PF) 100 MCG/2ML IJ SOLN
INTRAMUSCULAR | Status: AC
  Filled 2017-09-20: qty 2

## 2017-09-20 MED ORDER — LIDOCAINE HCL (PF) 1 % IJ SOLN
INTRAMUSCULAR | Status: AC
  Filled 2017-09-20: qty 30

## 2017-09-20 MED ORDER — VERAPAMIL HCL 2.5 MG/ML IV SOLN
INTRAVENOUS | Status: DC | PRN
  Administered 2017-09-20: 2.5 mg via INTRA_ARTERIAL

## 2017-09-20 MED ORDER — SODIUM CHLORIDE 0.9% FLUSH: 3.0000 mL | INTRAVENOUS | Status: DC | PRN

## 2017-09-20 MED ORDER — LEVOTHYROXINE SODIUM 25 MCG PO TABS
25.0000 ug | ORAL_TABLET | Freq: Every day | ORAL | Status: DC
  Administered 2017-09-21: 25 ug via ORAL
  Filled 2017-09-20: qty 1

## 2017-09-20 MED ORDER — SODIUM CHLORIDE 0.9 % IV SOLN: INTRAVENOUS | Status: DC

## 2017-09-20 MED ORDER — IOPAMIDOL (ISOVUE-300) INJECTION 61%
INTRAVENOUS | Status: DC | PRN
  Administered 2017-09-20: 40 mL via INTRA_ARTERIAL

## 2017-09-20 MED ORDER — SODIUM CHLORIDE 0.9 % IV SOLN: INTRAVENOUS | Status: AC

## 2017-09-20 MED ORDER — ASPIRIN 81 MG PO CHEW: 81.0000 mg | CHEWABLE_TABLET | ORAL | Status: DC

## 2017-09-20 MED ORDER — SODIUM CHLORIDE 0.9% FLUSH: 3.0000 mL | Freq: Two times a day (BID) | INTRAVENOUS | Status: DC

## 2017-09-20 MED ORDER — SODIUM CHLORIDE 0.9% FLUSH
3.0000 mL | Freq: Two times a day (BID) | INTRAVENOUS | Status: DC
  Administered 2017-09-20: 3 mL via INTRAVENOUS

## 2017-09-20 SURGICAL SUPPLY — 7 items
CATH INFINITI 5FR ANG PIGTAIL (CATHETERS) ×3 IMPLANT
CATH OPTITORQUE JACKY 4.0 5F (CATHETERS) ×3 IMPLANT
DEVICE RAD TR BAND REGULAR (VASCULAR PRODUCTS) ×3 IMPLANT
GLIDESHEATH SLEND SS 6F .021 (SHEATH) ×3 IMPLANT
KIT MANI 3VAL PERCEP (MISCELLANEOUS) ×3 IMPLANT
PACK CARDIAC CATH (CUSTOM PROCEDURE TRAY) ×3 IMPLANT
WIRE ROSEN-J .035X260CM (WIRE) ×3 IMPLANT

## 2017-09-20 NOTE — Progress Notes (Signed)
Progress Note  Patient Name: Carolyn Cox Date of Encounter: 09/20/2017  Primary Cardiologist: Julien Nordmannimothy Gollan, MD  Subjective   No significant c/p overnight.  Felt LH a few times yesterday when getting up to bathroom.  Echo shows LV dysfxn.  NPO for cath this AM.  Inpatient Medications    Scheduled Meds: . aspirin EC  81 mg Oral Daily  . nicotine  14 mg Transdermal Daily   Continuous Infusions: . sodium chloride 125 mL/hr at 09/20/17 0504   PRN Meds: acetaminophen **OR** acetaminophen, HYDROcodone-acetaminophen, hydrOXYzine, ketorolac, ondansetron **OR** ondansetron (ZOFRAN) IV   Vital Signs    Vitals:   09/19/17 1909 09/20/17 0108 09/20/17 0552 09/20/17 1014  BP: 91/61 (!) 85/54 97/63 116/71  Pulse: (!) 115 96 85 (!) 106  Resp:      Temp:    98.4 F (36.9 C)  TempSrc:    Oral  SpO2:    100%  Weight:      Height:        Intake/Output Summary (Last 24 hours) at 09/20/2017 1117 Last data filed at 09/20/2017 0044 Gross per 24 hour  Intake 480 ml  Output 600 ml  Net -120 ml   Filed Weights   09/18/17 1515  Weight: 146 lb (66.2 kg)    Physical Exam   GEN: Well nourished, well developed, in no acute distress.  HEENT: Grossly normal.  Neck: Supple, no JVD, carotid bruits, or masses. Cardiac: RRR, no murmurs, rubs, or gallops. No clubbing, cyanosis, edema.  Radials/DP/PT 2+ and equal bilaterally.  Respiratory:  Respirations regular and unlabored, clear to auscultation bilaterally. GI: Soft, nontender, nondistended, BS + x 4. MS: no deformity or atrophy. Skin: warm and dry, no rash. Neuro:  Strength and sensation are intact. Psych: AAOx3.  Normal affect.  Labs    Chemistry Recent Labs  Lab 09/18/17 1512 09/19/17 0626  NA 136 135  K 3.2* 3.7  CL 103 105  CO2 24 24  GLUCOSE 110* 101*  BUN 6 <5*  CREATININE 0.56 0.52  CALCIUM 9.1 8.8*  GFRNONAA >60 >60  GFRAA >60 >60  ANIONGAP 9 6     Hematology Recent Labs  Lab 09/18/17 1512  09/19/17 0626  WBC 9.0 10.2  RBC 4.58 4.76  HGB 12.8 13.3  HCT 38.7 40.4  MCV 84.5 84.8  MCH 27.9 27.9  MCHC 33.0 32.9  RDW 13.6 13.7  PLT 199 202    Cardiac Enzymes Recent Labs  Lab 09/18/17 1512 09/18/17 1902 09/19/17 0032 09/19/17 0626  TROPONINI 0.24* 0.76* 0.92* 0.64*     Radiology    Dg Chest 2 View  Result Date: 09/18/2017 CLINICAL DATA:  Palpitations, tachycardia. Onset after ingesting coughing this morning. First time occurrence. EXAM: CHEST  2 VIEW COMPARISON:  Chest x-ray of May 22, 2017. FINDINGS: The lungs are hyperinflated with mild hemidiaphragm flattening. There is no focal infiltrate. There is no pleural effusion or pneumothorax. The heart and pulmonary vascularity are normal. The mediastinum is normal in width. There is gentle curvature of the midthoracic spine convex toward the right at appears positional. IMPRESSION: Mild hyperinflation may be voluntary or may reflect underlying reactive airway disease and the patient's smoking history. No acute cardiopulmonary abnormality is observed. Electronically Signed   By: David  SwazilandJordan M.D.   On: 09/18/2017 16:00    Telemetry    Sinus rhythm, sinus tach - Personally Reviewed  Cardiac Studies   2D Echocardiogram 12.13.2018  Study Conclusions   - Left ventricle: The cavity  size was mildly dilated. Systolic   function was severely reduced. The estimated ejection fraction   was in the range of 25% to 30%. Diffuse hypokinesis. Regional   wall motion abnormalities cannot be excluded. Features are   consistent with a pseudonormal left ventricular filling pattern,   with concomitant abnormal relaxation and increased filling   pressure (grade 2 diastolic dysfunction). - Mitral valve: There was moderate regurgitation. - Right ventricle: Systolic function was normal. - Pulmonary arteries: Systolic pressure was mildly elevated. PA   peak pressure: 41 mm Hg (S).   Patient Profile     31 y.o. female with a h/o  polysubstance abuse, admitted 12/12 with complaints of chest discomfort and palpitations  sinus tachycardia, in the setting of methamphetamine usage, who was then found to have LV dysfxn w/ an EF of 25-30% on echo.  Assessment & Plan    1.  Cardiomyopathy/elevated troponin:  Pt admitted with intermittent sinus tachycardia and trop rise to peak of 0.92.  She did report vague chest discomfort and some dyspnea/LH - mostly with standing/walking.  Echo 12/13 shows EF of 25-30% with diff HK and gr2 DD. PASP .  Though she denied any recent drug use yesterday (other than MJ), she now says that she thought she was using cocaine on 12/12 but was later told by a friend that it was crystal meth.  In setting of new cardiomyopathy, will plan on diagnostic cath today to r/o CAD.  The patient understands that risks include but are not limited to stroke (1 in 1000), death (1 in 1000), kidney failure [usually temporary] (1 in 500), bleeding (1 in 200), allergic reaction [possibly serious] (1 in 200), and agrees to proceed.  Cont asa.  BP very soft and thus no room to add  blocker, acei/arb/arni/mra @ this time.  If bp improves, would need to add non-selective  blocker given h/o cocaine abuse.  2.  Polysubstance abuse:  As above, now admits to injecting crystal meth on day of admission.  She thought it was cocaine.  Seen by psych.  Needs outpt counseling/rehab.  Signed, Nicolasa Ducking, NP  09/20/2017, 11:17 AM    For questions or updates, please contact   Please consult [email or web address] for contact info under Cardiology/STEMI.

## 2017-09-20 NOTE — Progress Notes (Signed)
[facility]. [facility] [place] at [facility]   PATIENT NAME: Carolyn Cox    MR#:  [id number]  DATE OF BIRTH:  [birth year]  SUBJECTIVE:  CHIEF COMPLAINT:  Patient is Feeling better. While resting denies any palpitations but with ambulation she is experiencing palpitations. Scheduled for cardiac catheterization today. Denies any chest tightness or shortness of breath  REVIEW OF SYSTEMS:  CONSTITUTIONAL: No fever, fatigue or weakness.  EYES: No blurred or double vision.  EARS, NOSE, AND THROAT: No tinnitus or ear pain.  RESPIRATORY: No cough, shortness of breath, wheezing or hemoptysis.  CARDIOVASCULAR: No chest pain, orthopnea, edema. Reporting palpitations GASTROINTESTINAL: No nausea, vomiting, diarrhea or abdominal pain.  GENITOURINARY: No dysuria, hematuria.  ENDOCRINE: No polyuria, nocturia,  HEMATOLOGY: No anemia, easy bruising or bleeding SKIN: No rash or lesion. MUSCULOSKELETAL: No joint pain or arthritis.   NEUROLOGIC: No tingling, numbness, weakness.  PSYCHIATRY: No anxiety or depression.   DRUG ALLERGIES:  No Known Allergies  VITALS:  Blood pressure 105/73, pulse 83, temperature 99.1 F (37.3 C), temperature source Oral, resp. rate 15, height 5\' 6"  (1.676 m), weight 66.2 kg (146 lb), last menstrual period 09/11/2017, SpO2 98 %, not currently breastfeeding.  PHYSICAL EXAMINATION:  GENERAL:  31 y.o.-year-old patient lying in the bed with no acute distress.  EYES: Pupils equal, round, reactive to light and accommodation. No scleral icterus. Extraocular muscles intact.  HEENT: Head atraumatic, normocephalic. Oropharynx and nasopharynx clear.  NECK:  Supple, no jugular venous distention. No thyroid enlargement, no tenderness.  LUNGS: Normal breath sounds bilaterally, no wheezing, rales,rhonchi or crepitation. No use of accessory muscles of respiration.  CARDIOVASCULAR: S1, S2 normal. Tachycardic No murmurs, rubs, or gallops.  ABDOMEN: Soft, nontender,  nondistended. Bowel sounds present. No organomegaly or mass.  EXTREMITIES: Needle track marks were noticed on bilateral upper extremities No pedal edema, cyanosis, or clubbing.  NEUROLOGIC: Cranial nerves II through XII are intact. Muscle strength 5/5 in all extremities. Sensation intact. Gait not checked.  PSYCHIATRIC: The patient is alert and oriented x 3.  SKIN: No obvious rash, lesion, or ulcer.    LABORATORY PANEL:   CBC Recent Labs  Lab 09/19/17 0626  WBC 10.2  HGB 13.3  HCT 40.4  PLT 202   ------------------------------------------------------------------------------------------------------------------  Chemistries  Recent Labs  Lab 09/18/17 1512 09/19/17 0626  NA 136 135  K 3.2* 3.7  CL 103 105  CO2 24 24  GLUCOSE 110* 101*  BUN 6 <5*  CREATININE 0.56 0.52  CALCIUM 9.1 8.8*  MG 1.8  --    ------------------------------------------------------------------------------------------------------------------  Cardiac Enzymes Recent Labs  Lab 09/19/17 0626  TROPONINI 0.64*   ------------------------------------------------------------------------------------------------------------------  RADIOLOGY:  Dg Chest 2 View  Result Date: 09/18/2017 CLINICAL DATA:  Palpitations, tachycardia. Onset after ingesting coughing this morning. First time occurrence. EXAM: CHEST  2 VIEW COMPARISON:  Chest x-ray of May 22, 2017. FINDINGS: The lungs are hyperinflated with mild hemidiaphragm flattening. There is no focal infiltrate. There is no pleural effusion or pneumothorax. The heart and pulmonary vascularity are normal. The mediastinum is normal in width. There is gentle curvature of the midthoracic spine convex toward the right at appears positional. IMPRESSION: Mild hyperinflation may be voluntary or may reflect underlying reactive airway disease and the patient's smoking history. No acute cardiopulmonary abnormality is observed. Electronically Signed   By: David  SwazilandJordan M.D.    On: 09/18/2017 16:00    EKG:   Orders placed or performed during the hospital encounter of 09/18/17  . ED  EKG within 10 minutes  . EKG 12-Lead  . EKG 12-Lead  . ED EKG within 10 minutes    ASSESSMENT AND PLAN:     Patient is a 31 year old presenting with palpitation   #Sinus tachycardia probably secondary to polysubstance abuse Urine drug screen is positive for amphetamines, benzos, marijuana and opiates Cardiology is following, for cardiac cath today  Echocardiogram -25-30% White powdery substance was noticed by RN in her IV line suspected to be infused by the patient, continue close monitoring, sitter at bedside  #  Elevated troponin possibly related to tachycardia Will check echocardiogram patient on aspirin Cardiology is not considering any ischemic workup at this time  #Polysubstance abuse Patient needs drug rehabilitation Psych consult placed  #  Hypokalemia  replace her potassium potassium at 3.7 check magnesium   # Subnormal TSH TSH elevated free T4 is below normal  start patient on Synthroid 25  . miscellaneous Lovenox for DVT prophylaxis     All the records are reviewed and case discussed with Care Management/Social Workerr. Management plans discussed with the patient, family and they are in agreement.  CODE STATUS: FC   TOTAL TIME TAKING CARE OF THIS PATIENT: 36  minutes.   POSSIBLE D/C IN 1-2  DAYS, DEPENDING ON CLINICAL CONDITION.  Note: This dictation was prepared with Dragon dictation along with smaller phrase technology. Any transcriptional errors that result from this process are unintentional.   Ramonita Lab M.D on 09/20/2017 at 3:52 PM  Between 7am to 6pm - Pager - [phone] After 6pm go to [email or web address] - password [email or web address] [facility]  [place] [facility]  Office  [phone]  CC: Primary care physician; Mickey Farber, MD

## 2017-09-21 MED ORDER — CARVEDILOL 3.125 MG PO TABS: 3.1250 mg | ORAL_TABLET | Freq: Two times a day (BID) | ORAL | Status: DC

## 2017-09-21 MED ORDER — ASPIRIN 81 MG PO TBEC: 81.0000 mg | DELAYED_RELEASE_TABLET | Freq: Every day | ORAL | Status: DC

## 2017-09-21 MED ORDER — ACETAMINOPHEN 325 MG PO TABS: 325.0000 mg | ORAL_TABLET | Freq: Four times a day (QID) | ORAL | Status: DC | PRN

## 2017-09-21 MED ORDER — TRAZODONE HCL 50 MG PO TABS
50.0000 mg | ORAL_TABLET | Freq: Every day | ORAL | Status: DC
  Administered 2017-09-21: 50 mg via ORAL
  Filled 2017-09-21: qty 1

## 2017-09-21 MED ORDER — LEVOTHYROXINE SODIUM 25 MCG PO TABS: 25.0000 ug | ORAL_TABLET | Freq: Every day | ORAL | 0 refills | Status: DC

## 2017-09-21 MED ORDER — NICOTINE 14 MG/24HR TD PT24: 14.0000 mg | MEDICATED_PATCH | Freq: Every day | TRANSDERMAL | 0 refills | Status: DC

## 2017-09-21 MED ORDER — CARVEDILOL 3.125 MG PO TABS: 3.1250 mg | ORAL_TABLET | Freq: Two times a day (BID) | ORAL | 0 refills | Status: DC

## 2017-09-21 MED ORDER — BUTALBITAL-APAP-CAFFEINE 50-325-40 MG PO TABS
1.0000 | ORAL_TABLET | Freq: Once | ORAL | Status: AC
  Administered 2017-09-21: 1 via ORAL
  Filled 2017-09-21: qty 1

## 2017-09-21 NOTE — Progress Notes (Signed)
Pt to be discharged t home toay. Iv's and tele removed. disch instructions and prescrips given to pt. Discharged via w.c. Accompanied by friend.

## 2017-09-21 NOTE — Discharge Summary (Signed)
[facility] [place] [facility]   PATIENT NAME: Nathaniel ManKrystal Siharath    MR#:  [id number]  DATE OF BIRTH:  [birth year]  DATE OF ADMISSION:  09/18/2017 ADMITTING PHYSICIAN: Auburn BilberryShreyang Patel, MD  DATE OF DISCHARGE: 09/21/17 PRIMARY CARE PHYSICIAN: Mickey Farberhies, David, MD    ADMISSION DIAGNOSIS:  Elevated troponin I level [R74.8] Chest pain, unspecified type [R07.9] Acute left-sided low back pain with sciatica, sciatica laterality unspecified [M54.40]  DISCHARGE DIAGNOSIS:  Principal Problem:   Amphetamine abuse (HCC) Active Problems:   Elevated troponin   Cocaine abuse (HCC)   Substance-induced anxiety disorder (HCC)   Acute systolic heart failure (HCC)   SECONDARY DIAGNOSIS:   Past Medical History:  Diagnosis Date  . Polysubstance abuse (HCC)   . Sciatica     HOSPITAL COURSE:   HISTORY OF PRESENT ILLNESS: Nathaniel ManKrystal Almas  is a 31 y.o. female with a known history of drug abuse in the past currently denies any drug use presenting with palpitations.  Patient does not have any chest pains however she was having palpitations she is noted to have a troponin that is elevated.  She does report taking some cough medication recently.  Her urine drug screen was positive for marijuana as well as amphetamines.  She denies any chest pain.  She said when she was having palpitations she was feeling a little short of breath  Hospital course  #Sinus tachycardia probably secondary to polysubstance abuse Urine drug screen is positive for amphetamines, benzos, marijuana and opiates Cardiology is following,  patient is started on small dose of Coreg cardiac cath with normal coronary arteries Echocardiogram -25-30% White powdery substance was noticed by RN in her IV line suspected to be infused by the patient, continue close monitoring, sitter at bedside  #Nonischemic cardiomyopathy Minutes and fraction 25-30% Blood pressure being soft no room for ACE inhibitor Coreg is added to  the regimen.Despite use of cocaine benefit of beta blocker for DCM out weighs risk of Unopposed alpha stimulation    #Elevated troponin possibly related to tachycardia patient on aspirin   #Polysubstance abuse Patient needs drug rehabilitation Psych consult placed  # Hypokalemia  replace her potassium potassium at 3.7    # Subnormal TSH TSH elevated free T4 is below normal  start patient on Synthroid 25  .miscellaneous Lovenox for DVT prophylaxis   D/c pt home,ok from cardio consult  DISCHARGE CONDITIONS:   fair  CONSULTS OBTAINED:  Treatment Team:  Marinus Mawaylor, Gregg W, MD Clapacs, Jackquline DenmarkJohn T, MD   PROCEDURES   DRUG ALLERGIES:  No Known Allergies  DISCHARGE MEDICATIONS:   Allergies as of 09/21/2017   No Known Allergies     Medication List    STOP taking these medications   naproxen sodium 220 MG tablet Commonly known as:  ALEVE     TAKE these medications   acetaminophen 325 MG tablet Commonly known as:  TYLENOL Take 1 tablet (325 mg total) by mouth every 6 (six) hours as needed for mild pain (or Fever >/= 101).   aspirin 81 MG EC tablet Take 1 tablet (81 mg total) by mouth daily. Start taking on:  09/22/2017   carvedilol 3.125 MG tablet Commonly known as:  COREG Take 1 tablet (3.125 mg total) by mouth 2 (two) times daily with a meal.   ibuprofen 600 MG tablet Commonly known as:  ADVIL,MOTRIN Take 1 tablet (600 mg total) by mouth every 6 (six) hours.   levothyroxine 25 MCG tablet Commonly known as:  SYNTHROID, LEVOTHROID  Take 1 tablet (25 mcg total) by mouth daily before breakfast. Start taking on:  09/22/2017   multivitamin with minerals Tabs tablet Take 1 tablet by mouth daily.   nicotine 14 mg/24hr patch Commonly known as:  NICODERM CQ - dosed in mg/24 hours Place 1 patch (14 mg total) onto the skin daily. Start taking on:  09/22/2017        DISCHARGE INSTRUCTIONS:  Follow-up with primary care physician in a week Follow-up  with cardiology Dr. Mariah Milling in a week   DIET:  Cardiac diet  DISCHARGE CONDITION:  Fair  ACTIVITY:  Activity as tolerated  OXYGEN:  Home Oxygen: No.   Oxygen Delivery: room air  DISCHARGE LOCATION:  home   If you experience worsening of your admission symptoms, develop shortness of breath, life threatening emergency, suicidal or homicidal thoughts you must seek medical attention immediately by calling 911 or calling your MD immediately  if symptoms less severe.  You Must read complete instructions/literature along with all the possible adverse reactions/side effects for all the Medicines you take and that have been prescribed to you. Take any new Medicines after you have completely understood and accpet all the possible adverse reactions/side effects.   Please note  You were cared for by a hospitalist during your hospital stay. If you have any questions about your discharge medications or the care you received while you were in the hospital after you are discharged, you can call the unit and asked to speak with the hospitalist on call if the hospitalist that took care of you is not available. Once you are discharged, your primary care physician will handle any further medical issues. Please note that NO REFILLS for any discharge medications will be authorized once you are discharged, as it is imperative that you return to your primary care physician (or establish a relationship with a primary care physician if you do not have one) for your aftercare needs so that they can reassess your need for medications and monitor your lab values.     Today  Chief Complaint  Patient presents with  . Palpitations   Patient denies any chest pain or palpitations today. Had cardiac catheterization yesterday. Feels comfortable to go home. Okay to discharge patient from cardiology standpoint.  ROS:  CONSTITUTIONAL: Denies fevers, chills. Denies any fatigue, weakness.  EYES: Denies blurry vision,  double vision, eye pain. EARS, NOSE, THROAT: Denies tinnitus, ear pain, hearing loss. RESPIRATORY: Denies cough, wheeze, shortness of breath.  CARDIOVASCULAR: Denies chest pain, palpitations, edema.  GASTROINTESTINAL: Denies nausea, vomiting, diarrhea, abdominal pain. Denies bright red blood per rectum. GENITOURINARY: Denies dysuria, hematuria. ENDOCRINE: Denies nocturia or thyroid problems. HEMATOLOGIC AND LYMPHATIC: Denies easy bruising or bleeding. SKIN: Denies rash or lesion. MUSCULOSKELETAL: Denies pain in neck, back, shoulder, knees, hips or arthritic symptoms.  NEUROLOGIC: Denies paralysis, paresthesias.  PSYCHIATRIC: Denies anxiety or depressive symptoms.   VITAL SIGNS:  Blood pressure 107/64, pulse 85, temperature 97.8 F (36.6 C), temperature source Oral, resp. rate 16, height 5\' 6"  (1.676 m), weight 66.2 kg (146 lb), last menstrual period 09/11/2017, SpO2 98 %, not currently breastfeeding.  I/O:    Intake/Output Summary (Last 24 hours) at 09/21/2017 1147 Last data filed at 09/21/2017 1019 Gross per 24 hour  Intake 4892.08 ml  Output -  Net 4892.08 ml    PHYSICAL EXAMINATION:  GENERAL:  31 y.o.-year-old patient lying in the bed with no acute distress.  EYES: Pupils equal, round, reactive to light and accommodation. No scleral icterus. Extraocular  muscles intact.  HEENT: Head atraumatic, normocephalic. Oropharynx and nasopharynx clear.  NECK:  Supple, no jugular venous distention. No thyroid enlargement, no tenderness.  LUNGS: Normal breath sounds bilaterally, no wheezing, rales,rhonchi or crepitation. No use of accessory muscles of respiration.  CARDIOVASCULAR: S1, S2 normal. No murmurs, rubs, or gallops.  ABDOMEN: Soft, non-tender, non-distended. Bowel sounds present. No organomegaly or mass.  EXTREMITIES: No pedal edema, cyanosis, or clubbing.  NEUROLOGIC: Cranial nerves II through XII are intact. Muscle strength 5/5 in all extremities. Sensation intact. Gait not  checked.  PSYCHIATRIC: The patient is alert and oriented x 3.  SKIN: No obvious rash, lesion, or ulcer.   DATA REVIEW:   CBC Recent Labs  Lab 09/19/17 0626  WBC 10.2  HGB 13.3  HCT 40.4  PLT 202    Chemistries  Recent Labs  Lab 09/18/17 1512 09/19/17 0626  NA 136 135  K 3.2* 3.7  CL 103 105  CO2 24 24  GLUCOSE 110* 101*  BUN 6 <5*  CREATININE 0.56 0.52  CALCIUM 9.1 8.8*  MG 1.8  --     Cardiac Enzymes Recent Labs  Lab 09/19/17 0626  TROPONINI 0.64*    Microbiology Results  Results for orders placed or performed during the hospital encounter of 07/19/16  Urine culture     Status: None   Collection Time: 07/19/16 12:52 PM  Result Value Ref Range Status   Specimen Description URINE, RANDOM  Final   Special Requests NONE  Final   Culture NO GROWTH Performed at [facility]   Final   Report Status 07/20/2016 FINAL  Final    RADIOLOGY:  Dg Chest 2 View  Result Date: 09/18/2017 CLINICAL DATA:  Palpitations, tachycardia. Onset after ingesting coughing this morning. First time occurrence. EXAM: CHEST  2 VIEW COMPARISON:  Chest x-ray of May 22, 2017. FINDINGS: The lungs are hyperinflated with mild hemidiaphragm flattening. There is no focal infiltrate. There is no pleural effusion or pneumothorax. The heart and pulmonary vascularity are normal. The mediastinum is normal in width. There is gentle curvature of the midthoracic spine convex toward the right at appears positional. IMPRESSION: Mild hyperinflation may be voluntary or may reflect underlying reactive airway disease and the patient's smoking history. No acute cardiopulmonary abnormality is observed. Electronically Signed   By: David  [place] M.D.   On: 09/18/2017 16:00    EKG:   Orders placed or performed during the hospital encounter of 09/18/17  . ED EKG within 10 minutes  . EKG 12-Lead  . EKG 12-Lead  . ED EKG within 10 minutes      Management plans discussed with the patient, family and  they are in agreement.  CODE STATUS:     Code Status Orders  (From admission, onward)        Start     Ordered   09/18/17 1853  Full code  Continuous     09/18/17 1852    Code Status History    Date Active Date Inactive Code Status Order ID Comments User Context   07/17/2016 08:04 07/18/2016 19:05 Full Code [id number]  Tresea Mall, CNM Inpatient   07/17/2016 06:26 07/17/2016 08:04 Full Code [id number]  Tresea Mall, CNM Inpatient   07/17/2016 05:31 07/17/2016 06:26 Full Code [id number]  Tresea Mall, CNM Inpatient   07/13/2016 02:36 07/13/2016 06:02 Full Code [id number]  Nadara Mustard, MD Inpatient   07/08/2016 03:55 07/08/2016 12:36 Full Code [id number]  Vena Austria, MD Inpatient      TOTAL TIME  TAKING CARE OF THIS PATIENT: 43 minutes.   Note: This dictation was prepared with Dragon dictation along with smaller phrase technology. Any transcriptional errors that result from this process are unintentional.   @M[facility] @  on 09/21/2017 at 11:47 AM  Between 7am to 6pm - Pager - [phone]  After 6pm go to [email or web address] - password [email or web address] [facility]  [place] [facility]  Office  [phone]  CC: Primary care physician; Mickey Farber, MD

## 2017-09-21 NOTE — Discharge Instructions (Signed)
Follow-up with primary care physician in a week Follow-up with cardiology Dr. Mariah Milling in a week stop Using street drugs

## 2017-09-21 NOTE — Progress Notes (Addendum)
Progress Note  Patient Name: Carolyn Cox Date of Encounter: 09/21/2017  Primary Cardiologist: Julien Nordmannimothy Gollan, MD   Subjective   Palpitations high HR when walking/standing no chest pain   Inpatient Medications    Scheduled Meds: . aspirin EC  81 mg Oral Daily  . carvedilol  3.125 mg Oral BID WC  . levothyroxine  25 mcg Oral QAC breakfast  . nicotine  14 mg Transdermal Daily  . sodium chloride flush  3 mL Intravenous Q12H  . traZODone  50 mg Oral QHS   Continuous Infusions: . sodium chloride     PRN Meds: sodium chloride, acetaminophen **OR** acetaminophen, HYDROcodone-acetaminophen, hydrOXYzine, ketorolac, ondansetron **OR** ondansetron (ZOFRAN) IV, sodium chloride flush   Vital Signs    Vitals:   09/20/17 1800 09/20/17 1823 09/20/17 1953 09/21/17 0505  BP: 114/79 107/67 118/73 104/68  Pulse: (!) 105 (!) 101 (!) 101 87  Resp: 14 (!) 24 18 17   Temp:  98.1 F (36.7 C) 98.6 F (37 C) 97.9 F (36.6 C)  TempSrc:  Oral Oral Oral  SpO2: 100% 100% 100% 100%  Weight:      Height:        Intake/Output Summary (Last 24 hours) at 09/21/2017 0846 Last data filed at 09/20/2017 1521 Gross per 24 hour  Intake 4652.08 ml  Output -  Net 4652.08 ml   Filed Weights   09/18/17 1515 09/20/17 1431  Weight: 146 lb (66.2 kg) 146 lb (66.2 kg)    Telemetry    NSR rates 100-110 - Personally Reviewed  ECG    SR no acute ST changes  - Personally Reviewed  Physical Exam  Black Female  GEN: No acute distress.   Neck: No JVD Cardiac: RRR, no murmurs, rubs, or gallops.  Respiratory: Clear to auscultation bilaterally. GI: Soft, nontender, non-distended  MS: No edema; No deformity. Neuro:  Nonfocal  Psych: Normal affect  Right radial cath sight A   Labs    Chemistry Recent Labs  Lab 09/18/17 1512 09/19/17 0626  NA 136 135  K 3.2* 3.7  CL 103 105  CO2 24 24  GLUCOSE 110* 101*  BUN 6 <5*  CREATININE 0.56 0.52  CALCIUM 9.1 8.8*  GFRNONAA >60 >60  GFRAA >60  >60  ANIONGAP 9 6     Hematology Recent Labs  Lab 09/18/17 1512 09/19/17 0626  WBC 9.0 10.2  RBC 4.58 4.76  HGB 12.8 13.3  HCT 38.7 40.4  MCV 84.5 84.8  MCH 27.9 27.9  MCHC 33.0 32.9  RDW 13.6 13.7  PLT 199 202    Cardiac Enzymes Recent Labs  Lab 09/18/17 1512 09/18/17 1902 09/19/17 0032 09/19/17 0626  TROPONINI 0.24* 0.76* 0.92* 0.64*   No results for input(s): TROPIPOC in the last 168 hours.   BNPNo results for input(s): BNP, PROBNP in the last 168 hours.   DDimer No results for input(s): DDIMER in the last 168 hours.   Radiology    No results found.  Cardiac Studies   Echo: EF 25-30% reviewed  Patient Profile     31 y.o. female with drug abuse. SSCP Cath with normal coronary arteries. 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 Outpatient f/u Kirke Corinrida will need to try to institute ARB/ACE At that time. Despite use of cocaine benefit of beta blocker for DCM out weighs risk of Unopposed alpha stimulation. Med like labetalol would likely  be too expensive for her  2) Chest Pain - non cardiac clean cath   D/c home per primary service  For questions or updates, please contact [facility] Please consult [email or web address] for contact info under Cardiology/STEMI.      Signed, Charlton Haws, MD  09/21/2017, 8:46 AM

## 2017-09-21 NOTE — Clinical Social Work Note (Signed)
Clinical Social Work Assessment  Patient Details  Name: Carolyn Cox MRN: [id number] Date of Birth: 1986-08-30  Date of referral:  09/21/17               Reason for consult:  Substance Use/ETOH Abuse                Permission sought to share information with:    Permission granted to share information::     Name::        Agency::     Relationship::     Contact Information:     Housing/Transportation Living arrangements for the past 2 months:  Single Family Home Source of Information:  Patient Patient Interpreter Needed:  None Criminal Activity/Legal Involvement Pertinent to Current Situation/Hospitalization:  No - Comment as needed Significant Relationships:  Parents, [employer] Lives with:  Self Do you feel safe going back to the place where you live?  Yes Need for family participation in patient care:  No (Coment)  Care giving concerns:  Patient requested resources for substance use treatment   Social Worker assessment / plan:  CSW met with the patient at bedside to discuss substance use treatment options. The patient reported that her drug of choice is intravenous cocaine which she last used 3 months ago. Her current admission was due to intravenous use of methamphetamines which the patient had thought was cocaine. The patient expressed that this situation has frightened her enough to realize that she needs to make life changes before she "ends up dead."   The CSW provided a resource list for local substance use options and provided education on what to expect at intake and assessment for outpatient options. The CSW also advised the client to contact [place] for [facility] needs as the patient has Medicaid.   CSW is signing off. Please consult should new needs arise.  Employment status:  Unemployed Forensic scientist:  Medicaid In [place] PT Recommendations:  Not assessed at this time Information / Referral to community resources:  Outpatient Substance Abuse  Treatment Options, Support Groups  Patient/Family's Response to care:  The patient thanked the CSW.  Patient/Family's Understanding of and Emotional Response to Diagnosis, Current Treatment, and Prognosis:  The client is in the Contemplation/Preparation stage of change. The client is aware of options and is weighing her choices. The client is medically clear for discharge and has support from family for aftercare.  Emotional Assessment Appearance:  Appears stated age Attitude/Demeanor/Rapport:  (Bright, interested, pleasant) Affect (typically observed):  Appropriate, Pleasant Orientation:  Oriented to Self, Oriented to Place, Oriented to  Time, Oriented to Situation Alcohol / Substance use:  Illicit Drugs Psych involvement (Current and /or in the community):  No (Comment)  Discharge Needs  Concerns to be addressed:  Substance Abuse Concerns Readmission within the last 30 days:  No Current discharge risk:  Substance Abuse Barriers to Discharge:  No Barriers Identified   Zettie Pho, LCSW 09/21/2017, 11:49 AM

## 2017-09-21 NOTE — Progress Notes (Signed)
The patient is complaining of headache of 8/10 unrelieved by Vicodin 2 tabs and Toradol 15 mg IV. She stated headache is annoying and she can't sleep. Dr. Anne Hahn notified and he prescribed Trazodone 50 mg oral x one dose. No other pain med received from DR. Willis. Will continue to monitor.

## 2017-09-23 ENCOUNTER — Encounter: Payer: Self-pay | Admitting: Cardiovascular Disease

## 2017-10-08 NOTE — L&D Delivery Note (Addendum)
Obstetrical Delivery Note   Date of Delivery:   08/21/2018 Primary OB:   [facility] Gestational Age/EDD: [redacted]w[redacted]d (Dated by 12 week ultrasound) Antepartum complications: Hepatitis C, polysubstance abuse (cocaine, MJ), little prenatal care  Delivered By:   Farrel Conners, CNM  Delivery Type:   spontaneous vaginal delivery  Procedure Details:   Patient presented in advanced labor and was delivered by Rolly Pancake, RN. I arrived just after delivery of the female baby who was placed on mother's abdomen. After a delayed cord clamping, the cord was cut and baby continued on mother's chest skin to skin. Placenta delivered intact after cord blood obtained. No perineal or vaginal lacerations. Superficial periurethral abrasion was hemostatic, not needing repair.  Anesthesia:    none Intrapartum complications: Precipitous labor GBS:    negative Laceration:    none Episiotomy:    none Placenta:    Via active 3rd stage. To pathology: no Estimated Blood Loss:  Baby:    Liveborn female, Apgars 9/9, weight pending    Farrel Conners, CNM

## 2017-10-10 ENCOUNTER — Ambulatory Visit (INDEPENDENT_AMBULATORY_CARE_PROVIDER_SITE_OTHER): Payer: Medicaid Other | Admitting: Obstetrics and Gynecology

## 2017-10-10 ENCOUNTER — Encounter: Payer: Self-pay | Admitting: Obstetrics and Gynecology

## 2017-10-10 VITALS — BP 124/74 | Ht 66.0 in | Wt 142.0 lb

## 2017-10-10 DIAGNOSIS — N946 Dysmenorrhea, unspecified: Secondary | ICD-10-CM

## 2017-10-10 DIAGNOSIS — Z309 Encounter for contraceptive management, unspecified: Secondary | ICD-10-CM

## 2017-10-10 DIAGNOSIS — R1030 Lower abdominal pain, unspecified: Secondary | ICD-10-CM

## 2017-10-10 DIAGNOSIS — Z30014 Encounter for initial prescription of intrauterine contraceptive device: Secondary | ICD-10-CM

## 2017-10-10 DIAGNOSIS — N920 Excessive and frequent menstruation with regular cycle: Secondary | ICD-10-CM

## 2017-10-10 NOTE — Progress Notes (Signed)
Obstetrics & Gynecology Office Visit   Chief Complaint  Patient presents with  . Follow-up  . Pelvic Pain   History of Present Illness: 32 y.o. [id number] established patient who presents with two issues. The first she describes as back pain for which she has seen an orthopedist who has referred her to a neurologist. She describes the pain as originating in her left gluteal area and extending down the side of her leg. It is at times debilitating. She states that she never had this pain until after she had an epidural with her most recent delivery in 07/2016.  She has no lower back pain and no pain in the area of her spine. She states she has had an MRI of her spine and this is negative.  Her other issue is heavy vaginal bleeding with her period, her most recent started yesterday.  She also associates heavy cramping with her menses. She would very much like to go back on birth control. She has had a Mirena in the past and states that it was very helpful for her menses.  She has no liver disorders or clotting disorders.  Past Medical History:  Diagnosis Date  . Polysubstance abuse (HCC)   . Sciatica     Past Surgical History:  Procedure Laterality Date  . LEFT HEART CATH AND CORONARY ANGIOGRAPHY N/A 09/20/2017   Procedure: LEFT HEART CATH AND CORONARY ANGIOGRAPHY;  Surgeon: Iran Ouch, MD;  Location: [facility] INVASIVE CV LAB;  Service: Cardiovascular;  Laterality: N/A;  . MANDIBLE FRACTURE SURGERY     Gynecologic History: Patient's last menstrual period was 10/09/2017.  Obstetric History: G2P2002, s/p SVD x 2  Family History  Problem Relation Age of Onset  . Hypertension Mother     Social History   Socioeconomic History  . Marital status: Single    Spouse name: Not on file  . Number of children: Not on file  . Years of education: Not on file  . Highest education level: Not on file  Social Needs  . Financial resource strain: Not on file  . Food insecurity - worry: Not on  file  . Food insecurity - inability: Not on file  . Transportation needs - medical: Not on file  . Transportation needs - non-medical: Not on file  Occupational History  . Occupation: Waitress  Tobacco Use  . Smoking status: Current Some Day Smoker    Types: Cigarettes  . Smokeless tobacco: Never Used  . Tobacco comment: less than 1 pack/wk.  Substance and Sexual Activity  . Alcohol use: Yes    Comment: occasional drink - few x/wk.  . Drug use: Yes    Types: Cocaine, Marijuana, Amphetamines    Comment: Says she last used cocaine 4 mos ago.  Smoke MJ most days of the week.  Denies amphetamines but UDS+ 09/18/2017.  Marland Kitchen Sexual activity: Yes  Other Topics Concern  . Not on file  Social History Narrative   Lives in [place] with two children.  Works as Child psychotherapist.  Does not routinely exercise.       Allergies: No Known Allergies  Medications   Medication Sig Start Date End Date Taking? Authorizing Provider  acetaminophen (TYLENOL) 325 MG tablet Take 1 tablet (325 mg total) by mouth every 6 (six) hours as needed for mild pain (or Fever >/= 101). 09/21/17   Ramonita Lab, MD  aspirin EC 81 MG EC tablet Take 1 tablet (81 mg total) by mouth daily. 09/22/17   Ramonita Lab, MD  carvedilol (COREG) 3.125 MG tablet Take 1 tablet (3.125 mg total) by mouth 2 (two) times daily with a meal. 09/21/17   Gouru, Aruna, MD  ibuprofen (ADVIL,MOTRIN) 600 MG tablet Take 1 tablet (600 mg total) by mouth every 6 (six) hours. 07/18/16   Nadara Mustard, MD  levothyroxine (SYNTHROID, LEVOTHROID) 25 MCG tablet Take 1 tablet (25 mcg total) by mouth daily before breakfast. 09/22/17   Gouru, Deanna Artis, MD  Multiple Vitamin (MULTIVITAMIN WITH MINERALS) TABS tablet Take 1 tablet by mouth daily.    [provider]  nicotine (NICODERM CQ - DOSED IN MG/24 HOURS) 14 mg/24hr patch Place 1 patch (14 mg total) onto the skin daily. 09/22/17   Ramonita Lab, MD   Review of Systems  Constitutional: Negative.   HENT:  Negative.   Eyes: Negative.   Respiratory: Negative.   Cardiovascular: Negative.   Gastrointestinal: Positive for abdominal pain. Negative for blood in stool, constipation, diarrhea, melena, nausea and vomiting.  Genitourinary: Negative for dysuria, flank pain, frequency, hematuria and urgency.       See HPI  Musculoskeletal: Positive for back pain (left gluteal region is where she points to for this pain).  Skin: Negative.   Neurological: Negative.   Psychiatric/Behavioral: Positive for substance abuse. Negative for depression and suicidal ideas.    Physical Exam BP 124/74   Ht 5\' 6"  (1.676 m)   Wt 142 lb (64.4 kg)   LMP 10/09/2017   BMI 22.92 kg/m  Patient's last menstrual period was 10/09/2017. Physical Exam  Constitutional: She is oriented to person, place, and time. She appears well-developed and well-nourished. No distress.  Eyes: EOM are normal. No scleral icterus.  Neck: Normal range of motion. Neck supple.  Cardiovascular: Normal rate and regular rhythm.  Pulmonary/Chest: Effort normal and breath sounds normal. No respiratory distress. She has no wheezes. She has no rales.  Abdominal: Soft. Bowel sounds are normal. She exhibits no distension and no mass. There is no tenderness. There is no rebound and no guarding.  Mild ttp over left gluteal area. No back pain, no spine or paraspinous pain  Musculoskeletal: Normal range of motion. She exhibits no edema.  Neurological: She is alert and oriented to person, place, and time. No cranial nerve deficit.  Skin: Skin is warm and dry. No erythema.  Psychiatric: She has a normal mood and affect. Her behavior is normal. Judgment normal.   Assessment: 32 y.o. [id number] female here for  1. Encounter for initial prescription of intrauterine contraceptive device (IUD)   2. Dysmenorrhea   3. Menorrhagia with regular cycle      Plan: Problem List Items Addressed This Visit    Dysmenorrhea   Menorrhagia with regular cycle    Other  Visit Diagnoses    Encounter for initial prescription of intrauterine contraceptive device (IUD)    -  Primary     Reviewed all forms of birth control options available including abstinence; over the counter/barrier methods; hormonal contraceptive medication including pill, patch, ring, injection,contraceptive implant; hormonal and nonhormonal IUDs; permanent sterilization options including vasectomy and the various tubal sterilization modalities. Risks and benefits reviewed.  Questions were answered.  Information was given to patient to review.  She would like an IUD placed, specifically Mirena. Will get pelvic ultrasound. When she returns, will perform STD screen and place Mirean IUD.   Notably, she has a history of polysubstance abuse. She asked for pain medication for her "back" pain, which sounds like sciatic pain. I declined to offer her a  prescription today.  I will defer treatment of her sciatic pain to her neurologist or other provider treating this condition.   Thomasene Mohair, MD 10/10/2017 5:56 PM

## 2017-10-22 ENCOUNTER — Ambulatory Visit: Payer: Medicaid Other | Admitting: Obstetrics and Gynecology

## 2017-10-22 ENCOUNTER — Other Ambulatory Visit: Payer: Medicaid Other

## 2018-01-02 DIAGNOSIS — F17200 Nicotine dependence, unspecified, uncomplicated: Secondary | ICD-10-CM | POA: Insufficient documentation

## 2018-02-11 ENCOUNTER — Encounter (INDEPENDENT_AMBULATORY_CARE_PROVIDER_SITE_OTHER): Payer: Self-pay

## 2018-02-17 ENCOUNTER — Other Ambulatory Visit: Payer: Self-pay

## 2018-02-17 ENCOUNTER — Emergency Department
Admission: EM | Admit: 2018-02-17 | Discharge: 2018-02-17 | Disposition: A | Discharge status: Home / Self Care | Payer: Medicaid Other | Attending: Emergency Medicine | Admitting: Emergency Medicine

## 2018-02-17 ENCOUNTER — Encounter: Payer: Self-pay | Admitting: *Deleted

## 2018-02-17 ENCOUNTER — Emergency Department: Payer: Medicaid Other

## 2018-02-17 DIAGNOSIS — M25531 Pain in right wrist: Secondary | ICD-10-CM | POA: Diagnosis not present

## 2018-02-17 DIAGNOSIS — R109 Unspecified abdominal pain: Secondary | ICD-10-CM | POA: Insufficient documentation

## 2018-02-17 DIAGNOSIS — Y999 Unspecified external cause status: Secondary | ICD-10-CM | POA: Insufficient documentation

## 2018-02-17 DIAGNOSIS — Y9389 Activity, other specified: Secondary | ICD-10-CM | POA: Diagnosis not present

## 2018-02-17 DIAGNOSIS — F17210 Nicotine dependence, cigarettes, uncomplicated: Secondary | ICD-10-CM | POA: Diagnosis not present

## 2018-02-17 DIAGNOSIS — I5020 Unspecified systolic (congestive) heart failure: Secondary | ICD-10-CM | POA: Insufficient documentation

## 2018-02-17 DIAGNOSIS — M25572 Pain in left ankle and joints of left foot: Secondary | ICD-10-CM | POA: Insufficient documentation

## 2018-02-17 DIAGNOSIS — O9A211 Injury, poisoning and certain other consequences of external causes complicating pregnancy, first trimester: Secondary | ICD-10-CM | POA: Diagnosis not present

## 2018-02-17 DIAGNOSIS — Y9259 Other trade areas as the place of occurrence of the external cause: Secondary | ICD-10-CM | POA: Insufficient documentation

## 2018-02-17 DIAGNOSIS — S0081XA Abrasion of other part of head, initial encounter: Secondary | ICD-10-CM | POA: Insufficient documentation

## 2018-02-17 DIAGNOSIS — Z3A12 12 weeks gestation of pregnancy: Secondary | ICD-10-CM | POA: Diagnosis not present

## 2018-02-17 DIAGNOSIS — Z79899 Other long term (current) drug therapy: Secondary | ICD-10-CM | POA: Diagnosis not present

## 2018-02-17 DIAGNOSIS — R220 Localized swelling, mass and lump, head: Secondary | ICD-10-CM | POA: Insufficient documentation

## 2018-02-17 DIAGNOSIS — O99331 Smoking (tobacco) complicating pregnancy, first trimester: Secondary | ICD-10-CM | POA: Insufficient documentation

## 2018-02-17 DIAGNOSIS — Z7982 Long term (current) use of aspirin: Secondary | ICD-10-CM | POA: Diagnosis not present

## 2018-02-17 DIAGNOSIS — O26891 Other specified pregnancy related conditions, first trimester: Secondary | ICD-10-CM | POA: Insufficient documentation

## 2018-02-17 DIAGNOSIS — O99411 Diseases of the circulatory system complicating pregnancy, first trimester: Secondary | ICD-10-CM | POA: Insufficient documentation

## 2018-02-17 LAB — COMPREHENSIVE METABOLIC PANEL
ALBUMIN: 4.2 g/dL (ref 3.5–5.0)
ALK PHOS: 41 U/L (ref 38–126)
ALT: 52 U/L (ref 14–54)
ANION GAP: 10 (ref 5–15)
AST: 40 U/L (ref 15–41)
BILIRUBIN TOTAL: 0.5 mg/dL (ref 0.3–1.2)
BUN: 7 mg/dL (ref 6–20)
CALCIUM: 9.1 mg/dL (ref 8.9–10.3)
CO2: 24 mmol/L (ref 22–32)
Chloride: 102 mmol/L (ref 101–111)
Creatinine, Ser: 0.44 mg/dL (ref 0.44–1.00)
GFR calc Af Amer: >60 mL/min (ref >60)
GFR calc non Af Amer: >60 mL/min (ref >60)
GLUCOSE: 85 mg/dL (ref 65–99)
Potassium: 3.7 mmol/L (ref 3.5–5.1)
SODIUM: 136 mmol/L (ref 135–145)
TOTAL PROTEIN: 7.9 g/dL (ref 6.5–8.1)

## 2018-02-17 LAB — CBC WITH DIFFERENTIAL/PLATELET
BASOS PCT: 1 %
Basophils Absolute: 0.1 10*3/uL (ref 0–0.1)
EOS ABS: 0.2 10*3/uL (ref 0–0.7)
EOS PCT: 1 %
HCT: 39.2 % (ref 35.0–47.0)
HEMOGLOBIN: 13.2 g/dL (ref 12.0–16.0)
Lymphocytes Relative: 20 %
Lymphs Abs: 2.2 10*3/uL (ref 1.0–3.6)
MCH: 28 pg (ref 26.0–34.0)
MCHC: 33.6 g/dL (ref 32.0–36.0)
MCV: 83.4 fL (ref 80.0–100.0)
MONO ABS: 0.5 10*3/uL (ref 0.2–0.9)
MONOS PCT: 5 %
NEUTROS ABS: 8.2 10*3/uL — AB (ref 1.4–6.5)
Neutrophils Relative %: 73 %
PLATELETS: 244 10*3/uL (ref 150–440)
RBC: 4.7 MIL/uL (ref 3.80–5.20)
RDW: 14.1 % (ref 11.5–14.5)
WBC: 11.1 10*3/uL — ABNORMAL HIGH (ref 3.6–11.0)

## 2018-02-17 LAB — ABO/RH: ABO/RH(D): O POS

## 2018-02-17 LAB — HCG, QUANTITATIVE, PREGNANCY: HCG, BETA CHAIN, QUANT, S: 44817 m[IU]/mL — AB (ref <5)

## 2018-02-17 LAB — POCT PREGNANCY, URINE: Preg Test, Ur: POSITIVE — AB
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
## 2018-02-17 NOTE — Discharge Instructions (Signed)
YOU ARE MEDICALLY STABLE FOR BOOKING  Today you had an ultrasound showing you're about [redacted] weeks pregnant and everything looks healthy and normal.  Please make sure you establish care with an OB/Gyn within 1 week for this pregnancy and return to the ED for any concerns.
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
## 2018-02-24 ENCOUNTER — Telehealth: Payer: Self-pay

## 2018-03-18 ENCOUNTER — Other Ambulatory Visit: Payer: Self-pay | Admitting: Nurse Practitioner

## 2018-03-18 DIAGNOSIS — IMO0002 Reserved for concepts with insufficient information to code with codable children: Secondary | ICD-10-CM

## 2018-03-18 DIAGNOSIS — Z0489 Encounter for examination and observation for other specified reasons: Secondary | ICD-10-CM

## 2018-03-24 ENCOUNTER — Ambulatory Visit
Admission: RE | Admit: 2018-03-24 | Discharge: 2018-03-24 | Disposition: A | Discharge status: Home / Self Care | Payer: Medicaid Other | Source: Ambulatory Visit | Attending: Nurse Practitioner | Admitting: Nurse Practitioner

## 2018-03-24 DIAGNOSIS — Z3A17 17 weeks gestation of pregnancy: Secondary | ICD-10-CM | POA: Insufficient documentation

## 2018-03-24 DIAGNOSIS — Z3482 Encounter for supervision of other normal pregnancy, second trimester: Secondary | ICD-10-CM | POA: Diagnosis present

## 2018-03-24 DIAGNOSIS — Z0489 Encounter for examination and observation for other specified reasons: Secondary | ICD-10-CM

## 2018-03-24 DIAGNOSIS — IMO0002 Reserved for concepts with insufficient information to code with codable children: Secondary | ICD-10-CM

## 2018-07-27 ENCOUNTER — Encounter: Payer: Self-pay | Admitting: Certified Nurse Midwife

## 2018-07-27 ENCOUNTER — Other Ambulatory Visit: Payer: Self-pay

## 2018-07-27 ENCOUNTER — Observation Stay
Admission: EM | Admit: 2018-07-27 | Discharge: 2018-07-28 | Disposition: A | Discharge status: Home / Self Care | Payer: Medicaid Other | Attending: Certified Nurse Midwife | Admitting: Certified Nurse Midwife

## 2018-07-27 DIAGNOSIS — F1490 Cocaine use, unspecified, uncomplicated: Secondary | ICD-10-CM

## 2018-07-27 DIAGNOSIS — F1210 Cannabis abuse, uncomplicated: Secondary | ICD-10-CM | POA: Diagnosis not present

## 2018-07-27 DIAGNOSIS — Z3A34 34 weeks gestation of pregnancy: Secondary | ICD-10-CM | POA: Diagnosis not present

## 2018-07-27 DIAGNOSIS — Z590 Homelessness: Secondary | ICD-10-CM | POA: Diagnosis not present

## 2018-07-27 DIAGNOSIS — R109 Unspecified abdominal pain: Secondary | ICD-10-CM

## 2018-07-27 DIAGNOSIS — M5432 Sciatica, left side: Secondary | ICD-10-CM | POA: Insufficient documentation

## 2018-07-27 DIAGNOSIS — F17210 Nicotine dependence, cigarettes, uncomplicated: Secondary | ICD-10-CM | POA: Diagnosis not present

## 2018-07-27 DIAGNOSIS — O26899 Other specified pregnancy related conditions, unspecified trimester: Secondary | ICD-10-CM | POA: Diagnosis present

## 2018-07-27 DIAGNOSIS — O99413 Diseases of the circulatory system complicating pregnancy, third trimester: Secondary | ICD-10-CM | POA: Insufficient documentation

## 2018-07-27 DIAGNOSIS — O99323 Drug use complicating pregnancy, third trimester: Secondary | ICD-10-CM | POA: Diagnosis not present

## 2018-07-27 DIAGNOSIS — O0933 Supervision of pregnancy with insufficient antenatal care, third trimester: Secondary | ICD-10-CM

## 2018-07-27 DIAGNOSIS — F1410 Cocaine abuse, uncomplicated: Secondary | ICD-10-CM | POA: Diagnosis not present

## 2018-07-27 DIAGNOSIS — O98413 Viral hepatitis complicating pregnancy, third trimester: Secondary | ICD-10-CM | POA: Insufficient documentation

## 2018-07-27 DIAGNOSIS — B182 Chronic viral hepatitis C: Secondary | ICD-10-CM | POA: Insufficient documentation

## 2018-07-27 DIAGNOSIS — O99333 Smoking (tobacco) complicating pregnancy, third trimester: Secondary | ICD-10-CM | POA: Insufficient documentation

## 2018-07-27 DIAGNOSIS — O9989 Other specified diseases and conditions complicating pregnancy, childbirth and the puerperium: Secondary | ICD-10-CM | POA: Diagnosis not present

## 2018-07-27 DIAGNOSIS — I428 Other cardiomyopathies: Secondary | ICD-10-CM | POA: Insufficient documentation

## 2018-07-27 DIAGNOSIS — R1031 Right lower quadrant pain: Secondary | ICD-10-CM | POA: Diagnosis not present

## 2018-07-27 DIAGNOSIS — F1290 Cannabis use, unspecified, uncomplicated: Secondary | ICD-10-CM

## 2018-07-27 HISTORY — DX: Other cardiomyopathies: I42.8

## 2018-07-27 HISTORY — DX: Acute systolic (congestive) heart failure: I50.21

## 2018-07-27 HISTORY — DX: Chronic viral hepatitis C: B18.2

## 2018-07-27 LAB — URINE DRUG SCREEN, QUALITATIVE (ARMC ONLY)
AMPHETAMINES, UR SCREEN: NOT DETECTED
Barbiturates, Ur Screen: NOT DETECTED
Benzodiazepine, Ur Scrn: NOT DETECTED
COCAINE METABOLITE, UR ~~LOC~~: POSITIVE — AB
Cannabinoid 50 Ng, Ur ~~LOC~~: POSITIVE — AB
MDMA (ECSTASY) UR SCREEN: NOT DETECTED
METHADONE SCREEN, URINE: NOT DETECTED
Opiate, Ur Screen: NOT DETECTED
Phencyclidine (PCP) Ur S: NOT DETECTED
TRICYCLIC, UR SCREEN: NOT DETECTED

## 2018-07-27 LAB — URINALYSIS, COMPLETE (UACMP) WITH MICROSCOPIC
Glucose, UA: NEGATIVE mg/dL
HGB URINE DIPSTICK: NEGATIVE
Ketones, ur: 15 mg/dL — AB
NITRITE: NEGATIVE
PROTEIN: 30 mg/dL — AB
Specific Gravity, Urine: 1.02 (ref 1.005–1.030)
pH: 6 (ref 5.0–8.0)
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
## 2018-07-27 NOTE — OB Triage Note (Signed)
8/10 lower abd pain since 1200 10/20 reports light bleeding last week no LOF normal fetal movement. Brought in by [employer].

## 2018-07-28 ENCOUNTER — Encounter: Payer: Self-pay | Admitting: Certified Nurse Midwife

## 2018-07-28 DIAGNOSIS — O99323 Drug use complicating pregnancy, third trimester: Secondary | ICD-10-CM

## 2018-07-28 DIAGNOSIS — O0933 Supervision of pregnancy with insufficient antenatal care, third trimester: Secondary | ICD-10-CM

## 2018-07-28 DIAGNOSIS — Z3A34 34 weeks gestation of pregnancy: Secondary | ICD-10-CM

## 2018-07-28 DIAGNOSIS — F1290 Cannabis use, unspecified, uncomplicated: Secondary | ICD-10-CM

## 2018-07-28 DIAGNOSIS — F1490 Cocaine use, unspecified, uncomplicated: Secondary | ICD-10-CM

## 2018-07-28 DIAGNOSIS — O9989 Other specified diseases and conditions complicating pregnancy, childbirth and the puerperium: Secondary | ICD-10-CM

## 2018-07-28 DIAGNOSIS — R109 Unspecified abdominal pain: Secondary | ICD-10-CM

## 2018-07-28 LAB — CHLAMYDIA/NGC RT PCR (ARMC ONLY)
Chlamydia Tr: NOT DETECTED
N gonorrhoeae: NOT DETECTED

## 2018-07-28 LAB — COMPREHENSIVE METABOLIC PANEL
ALK PHOS: 106 U/L (ref 38–126)
ALT: 11 U/L (ref 0–44)
ANION GAP: 7 (ref 5–15)
AST: 19 U/L (ref 15–41)
Albumin: 2.7 g/dL — ABNORMAL LOW (ref 3.5–5.0)
BILIRUBIN TOTAL: 0.4 mg/dL (ref 0.3–1.2)
BUN: 6 mg/dL (ref 6–20)
CALCIUM: 8.1 mg/dL — AB (ref 8.9–10.3)
CO2: 22 mmol/L (ref 22–32)
Chloride: 106 mmol/L (ref 98–111)
Creatinine, Ser: 0.34 mg/dL — ABNORMAL LOW (ref 0.44–1.00)
GFR calc non Af Amer: >60 mL/min (ref >60)
GLUCOSE: 82 mg/dL (ref 70–99)
POTASSIUM: 3.6 mmol/L (ref 3.5–5.1)
Sodium: 135 mmol/L (ref 135–145)
TOTAL PROTEIN: 6.3 g/dL — AB (ref 6.5–8.1)

## 2018-07-28 LAB — CBC
HEMATOCRIT: 30.8 % — AB (ref 36.0–46.0)
HEMOGLOBIN: 10 g/dL — AB (ref 12.0–15.0)
MCH: 28.3 pg (ref 26.0–34.0)
MCHC: 32.5 g/dL (ref 30.0–36.0)
MCV: 87.3 fL (ref 80.0–100.0)
Platelets: 244 10*3/uL (ref 150–400)
RBC: 3.53 MIL/uL — ABNORMAL LOW (ref 3.87–5.11)
RDW: 12.4 % (ref 11.5–15.5)
WBC: 11.3 10*3/uL — AB (ref 4.0–10.5)
nRBC: 0 % (ref 0.0–0.2)

## 2018-07-28 LAB — RAPID HIV SCREEN (HIV 1/2 AB+AG)
HIV 1/2 Antibodies: NONREACTIVE
HIV-1 P24 Antigen - HIV24: NONREACTIVE

## 2018-07-28 MED ORDER — POLYETHYLENE GLYCOL 3350 17 G PO PACK
17.00 | PACK | ORAL | Status: DC
Start: ? — End: 2018-07-28

## 2018-07-28 MED ORDER — ONDANSETRON HCL 4 MG/2ML IJ SOLN
4.00 | INTRAMUSCULAR | Status: DC
Start: ? — End: 2018-07-28

## 2018-07-28 MED ORDER — DOCUSATE SODIUM 100 MG PO CAPS
100.00 | ORAL_CAPSULE | ORAL | Status: DC
Start: ? — End: 2018-07-28

## 2018-07-28 MED ORDER — LIDOCAINE HCL 1 % IJ SOLN
0.50 | INTRAMUSCULAR | Status: DC
Start: ? — End: 2018-07-28

## 2018-07-28 MED ORDER — MAGNESIUM HYDROXIDE 400 MG/5ML PO SUSP
15.00 | ORAL | Status: DC
Start: ? — End: 2018-07-28

## 2018-07-28 MED ORDER — ACETAMINOPHEN 325 MG PO TABS
975.00 | ORAL_TABLET | ORAL | Status: DC
Start: ? — End: 2018-07-28

## 2018-07-28 MED ORDER — PNV PRENATAL PLUS MULTIVITAMIN 27-1 MG PO TABS
1.00 | ORAL_TABLET | ORAL | Status: DC
Start: 2018-07-29 — End: 2018-07-28
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
## 2018-07-28 NOTE — Progress Notes (Signed)
Transferred to [facility] via EMS transport. Bag with clothes that she wore to hospital sent with patient.
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
## 2018-07-28 NOTE — Progress Notes (Signed)
Pt sleepy but awakens to voice.  Appropriately responds to questions. FHT 135 in LLQ.  Abd soft, no UC's palpated.  Pt acknowledges that she will be going to [facility] and accepts transport.

## 2018-07-28 NOTE — Discharge Summary (Addendum)
Discharge Summary/Transfer to [place] and Delivery  History of Present Illness:  Chief Complaint:   HPI:  Carolyn Cox is a 32 y.o. [phone] female with EDC=09/01/2018 at 73w6ddated by a 12 week ultrasound.  Her pregnancy has been complicated by polysubstance abuse, incarceration, little prenatal care, and chronic hepatitis C. . In addition she had a cardiac cath and angiogram in Dec 22992for acute systolic heart failure revealing a LV EF of 25-30%. She was placed on Coreg during her hospitalization, but she self discontinued the medication and did not follow up with cardiologist.   She presented to L&D accompanied by a policeman with complaints of "left sciatic pain" and right lower quadrant pain. She has had pain in her left SI joint area radiating down her buttock to the back of her thigh for more than 2 years and has had multiple medical treatments. Her right lower quadrant pain started today at noon and and is intermittent, lasting a few minutes. In the last hour she has had the pain twice. No vaginal bleeding. Baby active.  Having irregular contractions. No leakage of fluid from the vagina.  ROS is also remarkable for SOB on exertion. Prenatal care site: Minimal prenatal care at [facility]  Maternal Medical History:       Past Medical History:  Diagnosis Date  . Acute systolic heart failure ([facility] 09/2017  . Chronic hepatitis C ([facility]   . Nonischemic cardiomyopathy ([facility] 09/2017   EF 25-30% EF  . Polysubstance abuse ([facility]   . Sciatica          Past Surgical History:  Procedure Laterality Date  . LEFT HEART CATH AND CORONARY ANGIOGRAPHY N/A 09/20/2017   Procedure: LEFT HEART CATH AND CORONARY ANGIOGRAPHY;  Surgeon: AWellington Hampshire MD;  [facility];  Service: Cardiovascular;  Laterality: N/A;  . MANDIBLE FRACTURE SURGERY      No Known Allergies  Medications: Naprasyn today and prenatal gummies    Social History:  She  reports that she has been smoking cigarettes. She has never used smokeless tobacco. She reports that she drank alcohol. She reports that she has current or past drug history. Drugs: Cocaine and Marijuana. She is currently homeless. She does not have custody of her 2 children  Family History: family history includes Hypertension in her maternal grandmother and mother; Kidney disease in her maternal aunt and maternal grandmother.   Review of Systems: Review of Systems  Constitutional: Negative for fever and weight loss.  Respiratory: Positive for shortness of breath (with exertion).   Gastrointestinal: Positive for abdominal pain.  Genitourinary: Negative for dysuria.  Musculoskeletal: Positive for back pain (radiating to posterior left thigh). Negative for myalgias.  Skin: Negative for rash.  Psychiatric/Behavioral: Positive for substance abuse.      Physical Exam:  Vital Signs: BP 108/68   Pulse 90   LMP 10/09/2017  Pulse O2 97%  General: gravid BF, rubbing left leg and RLQ of abdomen and groaning when turning to side and moving left leg HEENT: normocephalic, atraumatic Heart: regular rate & rhythm.  No murmurs Lungs: clear to auscultation bilaterally Abdomen: soft, gravid, non-tender;   Pelvic: deferred. Cervix was closed on admission Extremities: non-tender, symmetric, no edema bilaterally.   Neurologic: Alert & oriented x 3.    Fetal Well Being: Fetal Heart Tones by hand held doppler: 135 Infrequent contractions   Prenatal Labs: Blood type/Rh Opositive 03/06/18  Antibody screen negative  Rubella MMR x 2  Varicella Immune  RPR Non reactive  HBsAg negative  HIV negative  GC Negative 03/07/18  Chlamydia Negative 03/07/18  Genetic screening   1 hour GTT Not done  3 hour GTT NA  GBS   TSH 0.936 03/06/18  LabResultsLast24Hours       Results for orders placed or performed during the hospital encounter of 07/27/18 (from the past 24 hour(s))  Urine  Drug Screen, Qualitative ([place] only)     Status: Abnormal   Collection Time: 07/27/18 10:08 PM  Result Value Ref Range   Tricyclic, Ur Screen NONE DETECTED NONE DETECTED   Amphetamines, Ur Screen NONE DETECTED NONE DETECTED   MDMA (Ecstasy)Ur Screen NONE DETECTED NONE DETECTED   Cocaine Metabolite,Ur [place] POSITIVE (A) NONE DETECTED   Opiate, Ur Screen NONE DETECTED NONE DETECTED   Phencyclidine (PCP) Ur S NONE DETECTED NONE DETECTED   Cannabinoid 50 Ng, Ur [place] POSITIVE (A) NONE DETECTED   Barbiturates, Ur Screen NONE DETECTED NONE DETECTED   Benzodiazepine, Ur Scrn NONE DETECTED NONE DETECTED   Methadone Scn, Ur NONE DETECTED NONE DETECTED  Urinalysis, Complete w Microscopic     Status: Abnormal   Collection Time: 07/27/18 10:08 PM  Result Value Ref Range   Color, Urine AMBER (A) YELLOW   APPearance CLEAR (A) CLEAR   Specific Gravity, Urine 1.020 1.005 - 1.030   pH 6.0 5.0 - 8.0   Glucose, UA NEGATIVE NEGATIVE mg/dL   Hgb urine dipstick NEGATIVE NEGATIVE   Bilirubin Urine SMALL (A) NEGATIVE   Ketones, ur 15 (A) NEGATIVE mg/dL   Protein, ur 30 (A) NEGATIVE mg/dL   Nitrite NEGATIVE NEGATIVE   Leukocytes, UA SMALL (A) NEGATIVE   RBC / HPF 0-5 0 - 5 RBC/hpf   WBC, UA 11-20 0 - 5 WBC/hpf   Bacteria, UA RARE (A) NONE SEEN   Squamous Epithelial / LPF 6-10 0 - 5   Mucus PRESENT      Assessment:  Carolyn Cox is a 32 y.o. [phone] female at 51w6dwith probable right round ligament pain and left sciatic type pain Polysubstance abuse: +cocaine and MJ on UDS Insufficient prenatal care/Homeless Hx of nonischemic cardiomyopathy with EF of 25-30% in Dec 2018 Hepatitis C chronic Plan:  [facility] 2. [facility]. EMTALA form complete   JRod Can CNM [facility], [facility]

## 2018-07-28 NOTE — Progress Notes (Signed)
Rounding with Farrel Conners CNM discussed antenatal testing for today.  This included consult with MFM in the [facility].  Jill Side spoke with Italy Grougut from [place] who suggested to go ahead and transfer to [place].  Tresea Mall working on transfer now.

## 2018-07-28 NOTE — Progress Notes (Signed)
Pt admitted to MB unit. No IV. No meds. VS with FHT's ordered q4 hours. No complaints from pt other than sciatic pain. Awaiting am consults. Continue to assess.

## 2018-07-28 NOTE — Progress Notes (Signed)
See Discharge summary

## 2018-07-29 ENCOUNTER — Telehealth: Payer: Self-pay | Admitting: Certified Nurse Midwife

## 2018-07-29 LAB — HEPATITIS C ANTIBODY (REFLEX): HCV Ab: >11 s/co ratio — ABNORMAL HIGH (ref 0.0–0.9)

## 2018-07-29 LAB — URINE CULTURE: SPECIAL REQUESTS: NORMAL

## 2018-07-29 LAB — RPR: RPR: NONREACTIVE

## 2018-07-29 LAB — COMMENT2 - HEP PANEL

## 2018-07-29 NOTE — Telephone Encounter (Signed)
Melissa, Social worker  from [facility] call to speak with CLG about patient. Please call [phone]. Not available after 5pm

## 2018-08-21 ENCOUNTER — Inpatient Hospital Stay
Admission: EM | Admit: 2018-08-21 | Discharge: 2018-08-23 | DRG: 806 | Disposition: A | Discharge status: Home / Self Care | Payer: Medicaid Other | Attending: Certified Nurse Midwife | Admitting: Certified Nurse Midwife

## 2018-08-21 ENCOUNTER — Other Ambulatory Visit: Payer: Self-pay

## 2018-08-21 DIAGNOSIS — O99324 Drug use complicating childbirth: Secondary | ICD-10-CM | POA: Diagnosis present

## 2018-08-21 DIAGNOSIS — O9842 Viral hepatitis complicating childbirth: Secondary | ICD-10-CM | POA: Diagnosis present

## 2018-08-21 DIAGNOSIS — Z3A38 38 weeks gestation of pregnancy: Secondary | ICD-10-CM

## 2018-08-21 DIAGNOSIS — F17210 Nicotine dependence, cigarettes, uncomplicated: Secondary | ICD-10-CM | POA: Diagnosis present

## 2018-08-21 DIAGNOSIS — F1910 Other psychoactive substance abuse, uncomplicated: Secondary | ICD-10-CM | POA: Diagnosis present

## 2018-08-21 DIAGNOSIS — Z590 Homelessness: Secondary | ICD-10-CM

## 2018-08-21 DIAGNOSIS — O9902 Anemia complicating childbirth: Secondary | ICD-10-CM | POA: Diagnosis present

## 2018-08-21 DIAGNOSIS — O99334 Smoking (tobacco) complicating childbirth: Secondary | ICD-10-CM | POA: Diagnosis present

## 2018-08-21 DIAGNOSIS — F1490 Cocaine use, unspecified, uncomplicated: Secondary | ICD-10-CM

## 2018-08-21 DIAGNOSIS — B182 Chronic viral hepatitis C: Secondary | ICD-10-CM | POA: Diagnosis present

## 2018-08-21 DIAGNOSIS — D649 Anemia, unspecified: Secondary | ICD-10-CM | POA: Diagnosis present

## 2018-08-21 LAB — URINE DRUG SCREEN, QUALITATIVE (ARMC ONLY)
Amphetamines, Ur Screen: NOT DETECTED
BARBITURATES, UR SCREEN: NOT DETECTED
BENZODIAZEPINE, UR SCRN: NOT DETECTED
CANNABINOID 50 NG, UR ~~LOC~~: NOT DETECTED
COCAINE METABOLITE, UR ~~LOC~~: POSITIVE — AB
MDMA (Ecstasy)Ur Screen: NOT DETECTED
Methadone Scn, Ur: NOT DETECTED
OPIATE, UR SCREEN: NOT DETECTED
PHENCYCLIDINE (PCP) UR S: NOT DETECTED
Tricyclic, Ur Screen: NOT DETECTED

## 2018-08-21 MED ORDER — FERROUS SULFATE 325 (65 FE) MG PO TABS
325.0000 mg | ORAL_TABLET | Freq: Every day | ORAL | Status: DC
  Administered 2018-08-21 – 2018-08-23 (×3): 325 mg via ORAL
  Filled 2018-08-21 (×3): qty 1

## 2018-08-21 MED ORDER — WITCH HAZEL-GLYCERIN EX PADS: 1.0000 "application " | MEDICATED_PAD | CUTANEOUS | Status: DC | PRN

## 2018-08-21 MED ORDER — OXYTOCIN 10 UNIT/ML IJ SOLN
INTRAMUSCULAR | Status: AC
  Administered 2018-08-21: 10 [IU] via INTRAMUSCULAR
  Filled 2018-08-21: qty 1

## 2018-08-21 MED ORDER — IBUPROFEN 600 MG PO TABS
ORAL_TABLET | ORAL | Status: AC
  Administered 2018-08-22: 600 mg via ORAL
  Filled 2018-08-21: qty 1

## 2018-08-21 MED ORDER — PRENATAL MULTIVITAMIN CH
1.0000 | ORAL_TABLET | Freq: Every day | ORAL | Status: DC
  Administered 2018-08-21 – 2018-08-22 (×2): 1 via ORAL
  Filled 2018-08-21 (×3): qty 1

## 2018-08-21 MED ORDER — ONDANSETRON HCL 4 MG PO TABS: 4.0000 mg | ORAL_TABLET | ORAL | Status: DC | PRN

## 2018-08-21 MED ORDER — ONDANSETRON HCL 4 MG/2ML IJ SOLN: 4.0000 mg | INTRAMUSCULAR | Status: DC | PRN

## 2018-08-21 MED ORDER — OXYCODONE HCL 5 MG PO TABS
5.0000 mg | ORAL_TABLET | Freq: Four times a day (QID) | ORAL | Status: DC | PRN
  Filled 2018-08-21: qty 1

## 2018-08-21 MED ORDER — SIMETHICONE 80 MG PO CHEW: 80.0000 mg | CHEWABLE_TABLET | ORAL | Status: DC | PRN

## 2018-08-21 MED ORDER — IBUPROFEN 600 MG PO TABS
600.0000 mg | ORAL_TABLET | Freq: Four times a day (QID) | ORAL | Status: DC
  Administered 2018-08-21 – 2018-08-23 (×8): 600 mg via ORAL
  Filled 2018-08-21 (×8): qty 1

## 2018-08-21 MED ORDER — BENZOCAINE-MENTHOL 20-0.5 % EX AERO
1.0000 "application " | INHALATION_SPRAY | CUTANEOUS | Status: DC | PRN
  Administered 2018-08-21: 1 via TOPICAL
  Filled 2018-08-21: qty 56

## 2018-08-21 MED ORDER — DIBUCAINE 1 % RE OINT: 1.0000 "application " | TOPICAL_OINTMENT | RECTAL | Status: DC | PRN

## 2018-08-21 MED ORDER — SENNOSIDES-DOCUSATE SODIUM 8.6-50 MG PO TABS
2.0000 | ORAL_TABLET | ORAL | Status: DC
  Administered 2018-08-21 – 2018-08-22 (×2): 2 via ORAL
  Filled 2018-08-21 (×3): qty 2

## 2018-08-21 MED ORDER — OXYTOCIN 10 UNIT/ML IJ SOLN
10.0000 [IU] | Freq: Once | INTRAMUSCULAR | Status: AC
  Administered 2018-08-21: 10 [IU] via INTRAMUSCULAR

## 2018-08-21 MED ORDER — OXYCODONE HCL 5 MG PO TABS
5.0000 mg | ORAL_TABLET | Freq: Three times a day (TID) | ORAL | Status: DC | PRN
  Administered 2018-08-21 – 2018-08-23 (×5): 5 mg via ORAL
  Filled 2018-08-21 (×4): qty 1

## 2018-08-21 NOTE — Progress Notes (Signed)
Pt called out saying she had peed so her UDS could be collected. Urine was very dilute/clear and was not very warm which raised concern that pt had tampered with urine by adding water to her urine to try and dilute it. UDS still sent to lab for testing.   Oswald Hillock, RN

## 2018-08-21 NOTE — H&P (Signed)
OB History & Physical   History of Present Illness:  Chief Complaint:   HPI:  Carolyn Cox is a 32 y.o. [phone] female with EDC=09/01/2018 at 38wk3d dated by a 12 week ultrasound.  Her pregnancy has been complicated by polysubstance abuse, incarceration, little prenatal care, and chronic hepatitis C. . In addition she had a cardiac cath and angiogram in Dec 5997 for acute systolic heart failure revealing a LV EF of 25-30%. She was placed on Coreg during her hospitalization, but she self discontinued the medication and did not follow up with cardiologist. Another echo done 10/22 revealed normal EF>55%. Last used cocaine the end of October. Also history of chronic left back pain and left leg pain which she calls sciatica Has been on multiple medications for this pain.  She presents with complaints of SROM for clear fluid and onset contractions at 0230. She delivered a viable female infant a few minutes after arriving on L&D.  Prenatal care site: Minimal prenatal care at [facility]  Maternal Medical History:   Past Medical History:  Diagnosis Date  . Acute systolic heart failure ([place]) 09/2017  . Chronic hepatitis C ([place])   . Nonischemic cardiomyopathy ([place]) 09/2017   EF 25-30% EF  . Polysubstance abuse ([place])   . Sciatica     Past Surgical History:  Procedure Laterality Date  . LEFT HEART CATH AND CORONARY ANGIOGRAPHY N/A 09/20/2017   Procedure: LEFT HEART CATH AND CORONARY ANGIOGRAPHY;  Surgeon: Wellington Hampshire, MD;  Location: [place] [facility];  Service: Cardiovascular;  Laterality: N/A;  . MANDIBLE FRACTURE SURGERY      No Known Allergies  Medications: Prenatal gummies    Social History: She  reports that she has been smoking cigarettes. She has never used smokeless tobacco. She reports that she drank alcohol. She reports that she has current or past drug history. Drugs: Cocaine and Marijuana. She is currently homeless. She does not have custody of her 2 children  Family  History: family history includes Hypertension in her maternal grandmother and mother; Kidney disease in her maternal aunt and maternal grandmother.   Review of Systems: Review of Systems  Constitutional: Negative for fever and weight loss.  Respiratory: Positive for shortness of breath (with exertion).   Gastrointestinal: Positive for abdominal pain.  Genitourinary: Negative for dysuria.  Musculoskeletal: Positive for back pain (radiating to posterior left thigh). Negative for myalgias.  Skin: Negative for rash.  Psychiatric/Behavioral: Positive for substance abuse.      Physical Exam:  Vital Signs: BP (!) 144/79   Pulse 97   Temp 98.4 F (36.9 C) (Oral)   Resp 18   Ht '5\' 6"'  (1.676 m)   Wt 67.1 kg   LMP 10/09/2017   BMI 23.89 kg/m  Pulse O2 97-100%  General: gravid BF appears very uncomfortable. HEENT: normocephalic, atraumatic Heart: regular rate & rhythm.  No murmurs Lungs: clear to auscultation bilaterally Abdomen: soft, gravid, non-tender;   Pelvic:   Back: points to left lumbar area as to area of pain postpartum.   Extremities: non-tender, symmetric, no edema bilaterally.   Neurologic: Alert & oriented x 3.    Prenatal Labs: Blood type/Rh O positive 03/06/18  Antibody screen negative  Rubella MMR x 2  Varicella Immune    RPR Non reactive  HBsAg negative  HIV negative  GC Negative 03/07/18  Chlamydia Negative 03/07/18  Genetic screening   1 hour GTT Not done  3 hour GTT NA  GBS Negative 10/23  TSH 0.936 03/06/18  Assessment:  Carolyn Cox is a 32 y.o. [phone] female at 81wk3d with precipitous labor and now delivery Hx of polysubstance abuse/ homelessness/ : Get UDS, social services consult Insufficient prenatal care Hepatitis C  Plan:  1. Admit for postpartum care 2. CBC, RPR, T&S 3. O POS/ MMR x 2/VI 4. Last TDAP 2017 5. Bottle/ Mirena IUD with Depo bridge 6. Social services consult  Dalia Heading  08/21/2018 5:36 AM

## 2018-08-21 NOTE — OB Triage Note (Signed)
PT come in laboring, states her water broke at 0230, clear fluid, contractions that started at the same time.

## 2018-08-21 NOTE — Progress Notes (Signed)
Pt placed on telemetry and into gown upon initial assessment. PT had two contractions and grabbed for vagina, This RN gloved and checked patients cervix, Pt found at complete with infant on perineum. PT instructed not to push. CNM called at this time. The next contraction pt delivered infant with no complications in one push.

## 2018-08-21 NOTE — Plan of Care (Signed)
Pt care plan in place.

## 2018-08-21 NOTE — Plan of Care (Signed)
Pt presented with precipitous delivery. Delivered baby girl, plan to continue stay with expectations of pain management for back pain chronic and infant monitoring. Will continue to assess and monitor.

## 2018-08-21 NOTE — Clinical Social Work Maternal (Signed)
CLINICAL SOCIAL WORK MATERNAL/CHILD NOTE  Patient Details  Name: Carolyn Cox MRN: [id number] Date of Birth: Nov 07, 1985  Date:  08/21/2018  Clinical Social Worker Initiating Note:  [name] MSW,LcSW Date/Time: Initiated:  08/21/18/      Child's Name:      Biological Parents:  Mother   Need for Interpreter:  None   Reason for Referral:  Current Substance Use/Substance Use During Pregnancy    Address:  [address] [place] [address]    Phone number:  [phone] (home)     Additional phone number: none  Household Members/Support Persons (HM/SP):       HM/SP Name Relationship DOB or Age  HM/SP -1        HM/SP -2        HM/SP -3        HM/SP -4        HM/SP -5        HM/SP -6        HM/SP -7        HM/SP -8          Natural Supports (not living in the home):  Friends   Professional Supports:     Employment:     Type of Work:     Education:      Homebound arranged:    Surveyor, quantity Resources:  Medicaid   Other Resources:      Cultural/Religious Considerations Which May Impact Care:  none  Strengths:      Psychotropic Medications:         Pediatrician:       Pediatrician List:   Publishing rights manager      Pediatrician Fax Number:    Risk Factors/Current Problems:  Substance Use    Cognitive State:  Alert , Able to Concentrate    Mood/Affect:  Calm    CSW Assessment: CSW made DSS CPS report regarding patient tampering with her infant's urine drug screen by emptying out the urine and filling it with water. The lab confirmed this. Patient has a long standing history of cocaine use, was found with a crack pipe in her vagina on 10/21, and does not have her 2 other children in her custody. CSW informed by DSS CPS that the report was denied and stated that they did not have enough according to their policies to take the case for investigation. CSW then contacted  the DSS Su[ervisor: Jeannene Patella at [phone]. CSW spent much time advocating for patient's newborn's safety to Ms. Blackwell. Ms. Amie Critchley stated that until there were drug screen results, she would need other tangible signs of inability to care for her newborn such as being unprepared in relation to supplies/necessities for her newborn, not caring/bonding with her newborn. When CSW went up to speak with patient's nurse, she informed me that patient had a bowel movement and while being changed by nursing staff, patient was able to urinate in the bag so they received a viable specimen. The newborn's drug screen returned positive for cocaine as did patient's. Patient had tampered with both her newborn and her own drug screen samples. CSW called Ms. Blackwell back and informed her of this information and DSS CPS will now accept the case for investigation.   CSW spoke with patient this afternoon regarding her and baby both being positive for cocaine. Patient was not surprised at results.  She stated that ever since she was transferred to [facility] on 10/21, she had not used. She stated she had used "every day, all day" prior to that. CSW eventually was able to obtain that patient continues to use daily but not all day. Patient's concerns were getting dilaudid for her pain and asking if DSS was going to take her newborn. CSW informed her that it was unknown what DSS's disposition would be at this time.  [name] MSW,LCSW [phone]  CSW Plan/Description:  Child Protective Service Report     [name], [place] 08/21/2018, 4:22 PM

## 2018-08-22 LAB — CBC
HCT: 32.2 % — ABNORMAL LOW (ref 36.0–46.0)
Hemoglobin: 10.3 g/dL — ABNORMAL LOW (ref 12.0–15.0)
MCH: 27.6 pg (ref 26.0–34.0)
MCHC: 32 g/dL (ref 30.0–36.0)
MCV: 86.3 fL (ref 80.0–100.0)
NRBC: 0 % (ref 0.0–0.2)
PLATELETS: 244 10*3/uL (ref 150–400)
RBC: 3.73 MIL/uL — AB (ref 3.87–5.11)
RDW: 13.1 % (ref 11.5–15.5)
WBC: 10.2 10*3/uL (ref 4.0–10.5)

## 2018-08-22 NOTE — Progress Notes (Signed)
   08/22/18 1415  Clinical Encounter Type  Visited With Patient  Visit Type Initial   While rounding on unit, chaplain followed staff recommendation to follow up with patient.  Patient indicated that she was expecting a visit from the social worker and declined chaplain visit.  Chaplain spoke of ongoing availability if needs changed.

## 2018-08-22 NOTE — Clinical Social Work Note (Addendum)
DSS CPS caseworker, Sheronda,([phone]) called to ask if the hospital could keep patient's newborn until Monday to give them time to find kinship placement as the person the patient identified for kinship placement resides in [facility] and they would need to get a [facility] DSS worker to do the home evaluation. CSW contacted patient's nurse and asked if she would call Dr. Earnest Conroy to see if this was a possibility. CSW received call from Dr. Earnest Conroy saying she would prefer to discharge patient's newborn when medically stable which would be Saturday. CSW called DSS caseworker back and informed her. The DSS caseworker then worked with her supervisors and they found an emergency placement for patient's newborn. The couple is: Ronnette Juniper and her husband, Samule Dry. The [facility] will take patient home at discharge. CSW informed the DSS caseworker to have the [facility] bring their photo identifications so nursing staff can verify who patient's newborn is discharging to.Patient's nurse is aware and CSW left a message for Dr. Earnest Conroy to update her.  [name] MSW,LCSW [phone]

## 2018-08-22 NOTE — Progress Notes (Signed)
Post Partum Day 1 Subjective: up ad lib, voiding and tolerating PO Bleeding slowing. Cramping less. Baby bottle feeding. Will be seeing Education officer, museum at 1430. Inviting a couple to sit in on Education officer, museum conference -who could act as guardians for baby if needed.   Objective: Blood pressure 99/72, pulse 75, temperature 97.7 F (36.5 C), temperature source Oral, resp. rate 20, height '5\' 6"'$  (1.676 m), weight 67.1 kg, last menstrual period 10/09/2017, SpO2 99 %.  Physical Exam:  General: alert, cooperative and no distress Lochia: appropriate Uterine Fundus: firm/ U-2/ ML/ NT  DVT Evaluation: No evidence of DVT seen on physical exam.  Recent Labs    08/22/18 0600  HGB 10.3*  HCT 32.2*  WBC 10.2  PLT 244    Assessment/Plan: Stable PPD#1-continue postpartum care Mild anemia-vitamins and FE Hepatitis C-need to refer to hepatologist postpartum Substance abuse- Social services/ DSS consulted O POS/ MMR x 2/ VI Bottle/ Depo/ Mirena TDAp 2017   LOS: 1 day   Dalia Heading 08/22/2018, 1:27 PM

## 2018-08-22 NOTE — Clinical Social Work Note (Signed)
Carolyn Cox with DSS CPS will be up to see patient some time after 2:30 today. [name] MSW,LCSW [phone]

## 2018-08-23 ENCOUNTER — Encounter: Payer: Self-pay | Admitting: Lactation Services

## 2018-08-23 LAB — RPR: RPR: NONREACTIVE

## 2018-08-23 MED ORDER — MEDROXYPROGESTERONE ACETATE 150 MG/ML IM SUSP
150.0000 mg | Freq: Once | INTRAMUSCULAR | Status: DC
  Filled 2018-08-23: qty 1

## 2018-08-23 NOTE — Discharge Summary (Signed)
OB Discharge Summary     Patient Name: Carolyn Cox DOB: 09/25/1986 MRN: [id number]  Date of admission: 08/21/2018 Delivering Provider: Precipitous Delivery, Farrel Conners, CNM present immediately following birth Date of Delivery: 08/21/2018  Date of discharge: 08/23/2018  Admitting diagnosis: 38 wks Labor Intrauterine pregnancy: [redacted]w[redacted]d     Secondary diagnosis: Substance abuse, Hepatitis C     Discharge diagnosis: Term Pregnancy Delivered, Substance abuse, Hepatitis C                         Hospital course:  Onset of Labor With Vaginal Delivery     32 y.o. yo [id number] at [redacted]w[redacted]d was admitted in Active Labor on 08/21/2018. Patient had an uncomplicated labor course as follows:  Membrane Rupture Time/Date: 2:30 AM ,08/21/2018   Intrapartum Procedures: Episiotomy: None [1]                                         Lacerations:     Patient had a delivery of a Viable infant. 08/21/2018  Information for the patient's newborn:  Leellen, Chavero [[id number]]  Delivery Method: Vag-Spont    Pateint had an uncomplicated postpartum course.  She is ambulating, tolerating a regular diet, passing flatus, and urinating well. Patient is discharged home in stable condition on 08/23/18.                                                                  Post partum procedures: none  Complications: None  Physical exam on 08/23/2018: Vitals:   08/22/18 1331 08/22/18 1604 08/23/18 0015 08/23/18 0750  BP: 102/64 103/64 124/77 105/63  Pulse: 81 84 69 77  Resp: 20 18 18 18   Temp: 97.8 F (36.6 C) 98.3 F (36.8 C) 97.9 F (36.6 C) 97.9 F (36.6 C)  TempSrc: Oral Oral Oral Oral  SpO2: 99% 100% 100% 98%  Weight:      Height:       General: cooperative and no distress Lochia: appropriate Uterine Fundus: firm Incision: N/A DVT Evaluation: No evidence of DVT seen on physical exam.  Labs: Lab Results  Component Value Date   WBC 10.2 08/22/2018   HGB 10.3 (L) 08/22/2018   HCT 32.2 (L)  08/22/2018   MCV 86.3 08/22/2018   PLT 244 08/22/2018   CMP Latest Ref Rng & Units 07/28/2018  Glucose 70 - 99 mg/dL 82  BUN 6 - 20 mg/dL 6  Creatinine 1.51 - 8.34 mg/dL 3.73(H)  Sodium 789 - 784 mmol/L 135  Potassium 3.5 - 5.1 mmol/L 3.6  Chloride 98 - 111 mmol/L 106  CO2 22 - 32 mmol/L 22  Calcium 8.9 - 10.3 mg/dL 8.1(L)  Total Protein 6.5 - 8.1 g/dL 6.3(L)  Total Bilirubin 0.3 - 1.2 mg/dL 0.4  Alkaline Phos 38 - 126 U/L 106  AST 15 - 41 U/L 19  ALT 0 - 44 U/L 11    Discharge instruction: per After Visit Summary.  Medications:  Allergies as of 08/23/2018   No Known Allergies     Medication List    You have not been prescribed any medications.     Diet: routine diet  Activity: Advance as tolerated. Pelvic rest for 6 weeks.   Outpatient follow up: Follow-up Information    Farrel Conners, CNM. Schedule an appointment as soon as possible for a visit in 6 week(s).   Specialty:  Certified Nurse Midwife Contact information: [address] [place] [address] [phone]             Postpartum contraception: Depo Provera and IUD Mirena Rhogam Given postpartum: no Rubella vaccine given postpartum: no Varicella vaccine given postpartum: no TDaP given antepartum or postpartum: no, TDaP in 2017  Newborn Data: Live born female  Birth Weight: 6 lb 3.8 oz (2830 g) APGAR: 9, 9  Newborn Delivery   Birth date/time:  08/21/2018 04:52:00 Delivery type:  Vaginal, Spontaneous      Baby Feeding: Formula  Disposition: discharge home, newborn released to custodian designated by DSS. Needs consult to hepatology for Hepatitis C.  SIGNED:  Oswaldo Conroy, CNM 08/23/2018 10:47 AM

## 2018-08-23 NOTE — Progress Notes (Signed)
Discussed discharge plans with patient this morning to ensure patient has transportation and safe place to discharge. Pt stated that she had planned to live with people who were going to get custody of her baby, but that she is no longer able to do this since the baby is not going to live with them.  Contacted Toy Cookey, CSW who advised that pt could have taxi voucher and discharge to homeless shelter. RN entered room to offer taxi voucher, but pt stated that she found a ride and a safe place to go, however, she would not tell RN where this would be.  Pt declined vaccines, Depo, Motrin, and Prenatal vitamin and asked when discharge papers would be completed.  RN stated to patient that papers could be ready in less than 5 minutes.  Pt asked "What will happen if I don't wait for them?"  RN informed patient that leaving without completing the discharge process could possibly affect follow-up care, billing, and custody of her child.  RN strongly encouraged pt to stay for 5 minutes to review discharge instructions and to leave a contact phone number in case this is needed for her baby. Pt stated that she did not have a number to give.  RN asked pt if she could leave someone else's phone number that could contact her for [place] if needed, and pt stated that she will not do this.  RN asked patient if she could do anything to help her, and again reminded her that anything she can do to show that she is willing to participate in care of her child including finding a way for hospital staff to contact her if needed could help her, and asked her for 5 minutes to get paperwork.  Patient stated "Yes mam.  I will wait." and requested a popsicle.  RN left room to print papers and to get popsicle.  When RN returned to room at 1135, patient had left.  AC, Shift coordinator, Arts administrator, and security notified.  Discussed with [name] and Toy Cookey, who reported to [place] with DSS.  Maxine Glenn stated that mother is not to be  allowed in to see infant per DSS if she returns.  Security, nursing staff, and Shift Coordinator notified to not allow visits with this baby.  Infant remains in care of Nursery RN. Reynold Bowen, RN 08/23/2018

## 2018-08-23 NOTE — Progress Notes (Signed)
Patient ambulated to nursery accompanied by myself; patient holding infant (with good maternal-infant bonding observed) and visit is supervised by Candyce Churn RN. Patient has slept most of tonight; pain controlled with po motrin and po oxycodone; tolerating regular diet and good po fluids; voiding; fundus firm; small amount rubra lochia.

## 2018-08-23 NOTE — Clinical Social Work Note (Addendum)
CSW received call from RN that the patient is not able to discharge to her original venue of choice due to DSS mandate that she not reside with the infant. The CSW advised that if the patient cannot find a different location, she is able to discharge to the local homeless shelter. The CSW will continue to follow should the patient need information about the homeless shelter.  UPDATE: The patient left without waiting for discharge documentation. The CSW advised that the patient cannot return to visit her infant without supervision from DSS. The CSW has attempted to contact DSS of [facility] to update and has placed a call to the lead CSW for [facility]. The CSW is awaiting callback and will update as to the birth certificate delivery when able.  Argentina Ponder, MSW, Theresia Majors [phone]

## 2018-08-23 NOTE — Clinical Social Work Note (Signed)
CSW Lead spoke with weekend CSW and patient's nurse this afternoon via phone. CSW Lead is aware of the Watt's family stating to DSS that they did not want the newborn and did not want patient coming to live with them. Patient's nurse today is the same nurse that had patient yesterday. CSW Lead contacted DSS CPS on call worker: Bjorn Loser: [phone] and informed her of the background information and that the plans were changed without contacting CSW. Due to Bjorn Loser not knowing the case well, she contacted her Supervisor and Physiological scientist. Bjorn Loser called CSW back to inform that the DSS caseworker from yesterday allegedly told the charge nurse of the change in plans and Bjorn Loser apologized that CSW was not contacted. Bjorn Loser stated that DSS will obtain custody of patient on Monday and will be looking for foster placement for patient's newborn. Patient will need to remain in the hospital until DSS custody and foster parents are determined. Bjorn Loser initially stated patient could visit with her newborn if she returned to the hospital but ONLY if patient was supervised by staff at all times AND if she didn't "seem" impaired. CSW Lead advised that it could not be guaranteed that there would be staff available to supervise patient's mother. As a result, Rhonda with DSS stated that she would then make the decision that patient could not visit with her newborn. If patient arrives to visit with her newborn, DSS on call would need to be contacted and the number to get DSS on call is: [phone]. Patient's nurse has been updated and informed that DSS is not wanting patient to visit her newborn at this time.  [name] MSW,LCSW Interim Lead Clinical Social Worker [phone]

## 2018-08-23 NOTE — Discharge Instructions (Signed)
Call your doctor for increased pain or vaginal bleeding, temperature above 100.4, depression, or concerns. Continue prenatal vitamin and iron.  No strenuous activity or heavy lifting for 6 weeks.  No intercourse, tampons, or douching for 6 weeks.  No tub baths- showers only.  No driving for 2 weeks or while taking pain medications. °

## 2018-08-23 NOTE — Clinical Social Work Note (Deleted)
CSW received a call from the direct case manager for the patient and her infant (Carolyn Cox,[phone]). The CSW updated Carolyn Cox on the happenings today with regards to the patient leaving prior to finishing documentation for the birth certificate or receiving discharge instructions. Carolyn Cox has also reported that the infant cannot have any visitors of any sort unless named by DSS as approved (such as a foster parent once one is assigned/determined). Carolyn Cox informed the CSW that the patient has been in touch with multiple friends to attempt assisting her with gaining access to the infant in the community. The CSW has updated the Interim Lead of the contact and is following.  Carolyn Cox, MSW, Theresia Majors [phone]

## 2018-09-19 ENCOUNTER — Emergency Department: Payer: BC Managed Care – PPO

## 2018-09-19 ENCOUNTER — Emergency Department
Admission: EM | Admit: 2018-09-19 | Discharge: 2018-09-19 | Disposition: A | Discharge status: Home / Self Care | Payer: BC Managed Care – PPO | Attending: Family | Admitting: Family

## 2018-09-19 DIAGNOSIS — J0100 Acute maxillary sinusitis, unspecified: Secondary | ICD-10-CM | POA: Insufficient documentation

## 2018-09-19 DIAGNOSIS — G43111 Migraine with aura, intractable, with status migrainosus: Secondary | ICD-10-CM | POA: Insufficient documentation

## 2018-09-19 LAB — CBC AND DIFFERENTIAL
Basophils %: 1 % (ref 0.0–3.0)
Basophils Absolute: 0.4 10*3/uL — ABNORMAL HIGH (ref 0.0–0.3)
Eosinophils %: 0 % (ref 0.0–7.0)
Eosinophils Absolute: 0 10*3/uL (ref 0.0–0.8)
Hematocrit: 38.3 % (ref 36.0–48.0)
Hemoglobin: 13.1 gm/dL (ref 12.0–16.0)
Lymphocytes Absolute: 2.2 10*3/uL (ref 0.6–5.1)
Lymphocytes: 15 % (ref 15.0–46.0)
MCH: 29 pg (ref 28–35)
MCHC: 34 gm/dL (ref 31–36)
MCV: 84 fL (ref 80–100)
MPV: 7.6 fL (ref 6.0–10.0)
Monocytes Absolute: 0.3 10*3/uL (ref 0.1–1.7)
Monocytes: 2 % — ABNORMAL LOW (ref 3.0–15.0)
Neutrophils %: 82 % — ABNORMAL HIGH (ref 42.0–78.0)
Neutrophils Absolute: 11.5 10*3/uL — ABNORMAL HIGH (ref 1.7–8.6)
PLT CT: 292 10*3/uL (ref 130–440)
RBC: 4.54 10*6/uL (ref 3.80–5.00)
RDW: 12 % (ref 10.5–14.5)
WBC: 14.4 10*3/uL — ABNORMAL HIGH (ref 4.0–11.0)

## 2018-09-19 LAB — COMPREHENSIVE METABOLIC PANEL
ALT: 19 U/L (ref 0–55)
AST (SGOT): 15 U/L (ref 10–42)
Albumin/Globulin Ratio: 0.99 Ratio (ref 0.80–2.00)
Albumin: 3.9 gm/dL (ref 3.5–5.0)
Alkaline Phosphatase: 59 U/L (ref 40–145)
Anion Gap: 15.9 mMol/L (ref 7.0–18.0)
BUN / Creatinine Ratio: 16.4 Ratio (ref 10.0–30.0)
BUN: 12 mg/dL (ref 7–22)
Bilirubin, Total: 0.4 mg/dL (ref 0.1–1.2)
CO2: 24 mMol/L (ref 20.0–30.0)
Calcium: 9.6 mg/dL (ref 8.5–10.5)
Chloride: 104 mMol/L (ref 98–110)
Creatinine: 0.73 mg/dL (ref 0.60–1.20)
EGFR: 111 mL/min/{1.73_m2} (ref 60–150)
Globulin: 4 gm/dL (ref 2.0–4.0)
Glucose: 104 mg/dL — ABNORMAL HIGH (ref 71–99)
Osmolality Calculated: 279 mOsm/kg (ref 275–300)
Potassium: 4 mMol/L (ref 3.5–5.3)
Protein, Total: 7.9 gm/dL (ref 6.0–8.3)
Sodium: 140 mMol/L (ref 136–147)

## 2018-09-19 LAB — SEDIMENTATION RATE: Sed Rate: 38 mm/hr — ABNORMAL HIGH (ref 0–20)

## 2018-09-19 MED ORDER — SODIUM CHLORIDE 0.9 % IV BOLUS
1000.00 mL | Freq: Once | INTRAVENOUS | Status: AC
Start: 2018-09-19 — End: 2018-09-19
  Administered 2018-09-19: 14:00:00 1000 mL via INTRAVENOUS

## 2018-09-19 MED ORDER — ONDANSETRON HCL 4 MG/2ML IJ SOLN
4.00 mg | Freq: Once | INTRAMUSCULAR | Status: DC
Start: 2018-09-19 — End: 2018-09-19

## 2018-09-19 MED ORDER — VH HYDROMORPHONE HCL PF 1 MG/ML CARPUJECT
INTRAMUSCULAR | Status: AC
Start: 2018-09-19 — End: ?
  Filled 2018-09-19: qty 1

## 2018-09-19 MED ORDER — DIPHENHYDRAMINE HCL 50 MG/ML IJ SOLN
25.00 mg | Freq: Once | INTRAMUSCULAR | Status: DC
Start: 2018-09-19 — End: 2018-09-19

## 2018-09-19 MED ORDER — DIPHENHYDRAMINE HCL 50 MG/ML IJ SOLN
INTRAMUSCULAR | Status: AC
Start: 2018-09-19 — End: ?
  Filled 2018-09-19: qty 1

## 2018-09-19 MED ORDER — VH HYDROMORPHONE HCL PF 1 MG/ML CARPUJECT
1.00 mg | Freq: Once | INTRAMUSCULAR | Status: AC
Start: 2018-09-19 — End: 2018-09-19
  Administered 2018-09-19: 14:00:00 1 mg via INTRAVENOUS

## 2018-09-19 MED ORDER — KETOROLAC TROMETHAMINE 60 MG/2ML IM SOLN
INTRAMUSCULAR | Status: AC
Start: 2018-09-19 — End: ?
  Filled 2018-09-19: qty 2

## 2018-09-19 MED ORDER — DIPHENHYDRAMINE HCL 50 MG/ML IJ SOLN
25.00 mg | Freq: Once | INTRAMUSCULAR | Status: AC
Start: 2018-09-19 — End: 2018-09-19
  Administered 2018-09-19: 12:00:00 25 mg via INTRAVENOUS

## 2018-09-19 MED ORDER — KETOROLAC TROMETHAMINE 30 MG/ML IJ SOLN
30.00 mg | Freq: Once | INTRAMUSCULAR | Status: AC
Start: 2018-09-19 — End: 2018-09-19
  Administered 2018-09-19: 12:00:00 30 mg via INTRAVENOUS

## 2018-09-19 MED ORDER — ONDANSETRON HCL 4 MG/2ML IJ SOLN
INTRAMUSCULAR | Status: AC
Start: 2018-09-19 — End: ?
  Filled 2018-09-19: qty 2

## 2018-09-19 MED ORDER — PROCHLORPERAZINE MALEATE 5 MG PO TABS
10.0000 mg | ORAL_TABLET | Freq: Once | ORAL | Status: DC
Start: 2018-09-19 — End: 2018-09-19

## 2018-09-19 MED ORDER — ONDANSETRON HCL 8 MG PO TABS
8.0000 mg | ORAL_TABLET | Freq: Four times a day (QID) | ORAL | 0 refills | Status: DC | PRN
Start: 2018-09-19 — End: 2021-02-24

## 2018-09-19 MED ORDER — KETOROLAC TROMETHAMINE 60 MG/2ML IM SOLN
60.00 mg | Freq: Once | INTRAMUSCULAR | Status: DC
Start: 2018-09-19 — End: 2018-09-19

## 2018-09-19 MED ORDER — KETOROLAC TROMETHAMINE 30 MG/ML IJ SOLN
INTRAMUSCULAR | Status: AC
Start: 2018-09-19 — End: ?
  Filled 2018-09-19: qty 1

## 2018-09-19 MED ORDER — ONDANSETRON HCL 4 MG/2ML IJ SOLN
4.00 mg | Freq: Once | INTRAMUSCULAR | Status: AC
Start: 2018-09-19 — End: 2018-09-19
  Administered 2018-09-19: 12:00:00 4 mg via INTRAVENOUS

## 2018-09-19 MED ORDER — BUTALBITAL-APAP-CAFFEINE 50-325-40 MG PO TABS
1.0000 | ORAL_TABLET | ORAL | 0 refills | Status: DC | PRN
Start: 2018-09-19 — End: 2021-02-24

## 2018-09-19 MED ORDER — DOXYCYCLINE HYCLATE 100 MG PO CAPS
100.00 mg | ORAL_CAPSULE | Freq: Two times a day (BID) | ORAL | 0 refills | Status: AC
Start: 2018-09-19 — End: 2018-10-03

## 2018-09-19 NOTE — ED Notes (Signed)
Pt denies any improvement in migraine since meds were administered

## 2018-09-19 NOTE — ED Provider Notes (Signed)
[facility] EMERGENCY DEPARTMENT   History and Physical Exam      Patient Name: Debra Gomez, Debra Gomez  Encounter Date:  09/19/2018  Attending Physician: Kathrynn Running, MD  Nurse Practitioner: Jens Som, NP  PCP: Marisa Sprinkles, MD  Patient DOB:  August 30, 1986  MRN:  [id number]  Room:  E6/ED6-A      History of Presenting Illness     Chief complaint: Headache    HPI/ROS is limited by: none  HPI/ROS given by: patient      Provocative: hx of migraines  Pallative Factors: none  Quality: "feels like it is going to explode" and throbbing  Region: frontal  Radiation: none  Severity: severe  Duration/Temporal Factors: 7 hours ago, constant, not worsening but not improving.       Debra Gomez is a 31 y.o. female who presents with Headache: Patient presents with headache. Symptoms began about 7 hours ago-0530 am.  Generally, the headaches last about a few hours and occur several times per month. Usually resolvd with ibuprofen and or caffeine. This headache is more intense than regular headaches.  However, she has had headaches this bad.  But they are very rare. No head injury .Precipitating factors include none which have been determined. She did get new contact lenses several days ago is the only thing new she can think of.  The headaches are usually preceded by an aura consisting of a peculiar odor. She has been diagnosed with migraines.  Associated neurologic symptoms which are present include: decreased physical activity and dizziness. The patient denies depression, loss of balance, muscle weakness, numbness of extremities, speech difficulties, vision problems and vomiting in the early morning. Other associated symptoms include: dizziness, fatigue, nausea and vomiting. She does have photophobia.   Symptoms which are not present include: abdominal pain, appetite decrease, chest pain, conjunctivitis, cough, diarrhea, earache, fever, hoarseness, neck stiffness, rash, sneezing, sore throat and wheezing. Home treatment has included  ibuprofen and caffeine, darkening the room and resting with poor improvement. Other history includes: migraine headaches diagnosed in the past. No history of sinusitis.       Review of Systems   Review of Systems   Constitutional: Negative for chills, fever and malaise/fatigue.   HENT: Negative for congestion, ear pain and sore throat.    Eyes: Positive for photophobia. Negative for blurred vision, double vision, pain, discharge and redness.   Respiratory: Negative for cough and shortness of breath.    Cardiovascular: Negative for chest pain and leg swelling.   Gastrointestinal: Positive for nausea and vomiting. Negative for abdominal pain, blood in stool, constipation, diarrhea and melena.   Genitourinary: Negative for dysuria, frequency and urgency.   Musculoskeletal: Negative for back pain, joint pain, myalgias and neck pain.   Skin: Negative for rash.   Neurological: Positive for dizziness and headaches. Negative for tingling, tremors, sensory change, speech change, focal weakness, seizures, loss of consciousness and weakness.   Endo/Heme/Allergies: Positive for environmental allergies. Does not bruise/bleed easily.   Psychiatric/Behavioral: Negative for suicidal ideas.       Allergies     Pt is allergic to sulfa antibiotics.    Medications     Current Outpatient Medications   Medication Sig   . butalbital-acetaminophen-caffeine (FIORICET, ESGIC) 50-325-40 MG per tablet Take 1-2 tablets by mouth every 4 (four) hours as needed for Pain No more than 6/day.   Marland Kitchen doxycycline (VIBRAMYCIN) 100 MG capsule Take 1 capsule (100 mg total) by mouth 2 (two) times daily for 14 days   .  ondansetron (ZOFRAN) 8 MG tablet Take 1 tablet (8 mg total) by mouth every 6 (six) hours as needed for Nausea       Past Medical History     Pt has a past medical history of Migraines and Reactive airway disease.    Past Surgical History     Pt has no past surgical history on file.    Family History     The family history is not on  file.    Social History     Pt reports that she has been smoking. She has been smoking about 0.50 packs per day. She has never used smokeless tobacco. She reports current alcohol use. She reports that she does not use drugs.    Physical Exam     Blood pressure (!) 151/91, pulse 65, temperature 98 F (36.7 C), temperature source Oral, resp. rate 18, height 1.702 m, weight (!) 160.8 kg, last menstrual period 08/30/2018, SpO2 98 %.    Physical Exam   Constitutional: Vital signs are normal. She appears well-developed and well-nourished. She is cooperative.  Non-toxic appearance. She does not have a sickly appearance. She does not appear ill. She appears distressed (appears uncomfortable. ).   HENT:   Head: Normocephalic and atraumatic.   Right Ear: Hearing, tympanic membrane, external ear and ear canal normal.   Left Ear: Hearing, tympanic membrane, external ear and ear canal normal.   Nose: Right sinus exhibits maxillary sinus tenderness. Left sinus exhibits maxillary sinus tenderness.   Mouth/Throat: Uvula is midline, oropharynx is clear and moist and mucous membranes are normal.   Eyes: Pupils are equal, round, and reactive to light. Conjunctivae, EOM and lids are normal.   Neck: Trachea normal and normal range of motion. Neck supple. No JVD present. Carotid bruit is not present.   Cardiovascular: Normal rate and regular rhythm.   Pulmonary/Chest: Effort normal and breath sounds normal.   Abdominal: Soft. Bowel sounds are normal. She exhibits no distension and no mass. There is no tenderness.   Musculoskeletal: Normal range of motion.   Lymphadenopathy:     She has no cervical adenopathy.   Neurological: She displays a negative Romberg sign.   Mental status: Awake, alert, and oriented to person place and time with fluent speech and appropriate affect. Recent and remote memory intact. Able to spell WORLD backwards. Able to follow multi-step commands.   Cranial Nerve Exam:   CN 2 Normal vision without visual field  defects   CN 3,4,6 EOMs intact without nystagmus, Pupils equal, round and reactive.   CN 5 Facial sensation is symmetrical.   CN 7 Spontaneous facial expressions are symmetrical.   CN 8 Auditory acuity intact bilaterally to casual conversation.   CN 9 Palate rises with phonation.   CN 11 Trapezius and sternocleidomastoids intact.   CN 12 Tongue protrudes in midline.   Motor: Power is 5/5 in all major muscle groups with normal tone and bulk for age. No drift, dystonia or tremors appreciated.   Sensory: Sensation to light touch is intact throughout.   Coordination: Rapid alternating movements, brisk, coordinated and symmetric.   DTRs: 2+ and symmetric bilaterally   Gait: normal     Skin: Skin is warm and dry.   Psychiatric: She has a normal mood and affect. Her behavior is normal.       Orders Placed     Orders Placed This Encounter   Procedures   . CT Head WO- (Rad read)   . CBC   .  CMP   . ESR       Diagnostic Results       The results of the diagnostic studies below have been reviewed by myself:    Labs  Results     Procedure Component Value Units Date/Time    ESR [[id number]]  (Abnormal) Collected:  09/19/18 1345    Specimen:  Blood Updated:  09/19/18 1432     Sed Rate 38 mm/hr     CBC [[id number]]  (Abnormal) Collected:  09/19/18 1345    Specimen:  Blood Updated:  09/19/18 1410     WBC 14.4 K/cmm      RBC 4.54 M/cmm      Hemoglobin 13.1 gm/dL      Hematocrit 78.2 %      MCV 84 fL      MCH 29 pg      MCHC 34 gm/dL      RDW 95.6 %      PLT CT 292 K/cmm      MPV 7.6 fL      NEUTROPHIL % 82.0 %      Lymphocytes 15.0 %      Monocytes 2.0 %      Eosinophils % 0.0 %      Basophils % 1.0 %      Neutrophils Absolute 11.5 K/cmm      Lymphocytes Absolute 2.2 K/cmm      Monocytes Absolute 0.3 K/cmm      Eosinophils Absolute 0.0 K/cmm      BASO Absolute 0.4 K/cmm      RBC Morphology Morphology Consistent with Hemogram    Narrative:       Manual differential performed    CMP [[id number]]  (Abnormal) Collected:  09/19/18 1345     Specimen:  Plasma Updated:  09/19/18 1408     Sodium 140 mMol/L      Potassium 4.0 mMol/L      Chloride 104 mMol/L      CO2 24.0 mMol/L      Calcium 9.6 mg/dL      Glucose 469 mg/dL      Creatinine 6.29 mg/dL      BUN 12 mg/dL      Protein, Total 7.9 gm/dL      Albumin 3.9 gm/dL      Alkaline Phosphatase 59 U/L      ALT 19 U/L      AST (SGOT) 15 U/L      Bilirubin, Total 0.4 mg/dL      Albumin/Globulin Ratio 0.99 Ratio      Anion Gap 15.9 mMol/L      BUN/Creatinine Ratio 16.4 Ratio      EGFR 111 mL/min/1.63m2      Osmolality Calculated 279 mOsm/kg      Globulin 4.0 gm/dL           Radiologic Studies  Radiology Results (24 Hour)     Procedure Component Value Units Date/Time    CT Head WO- (Rad read) [[id number]] Collected:  09/19/18 1413    Order Status:  Completed Updated:  09/19/18 1416    Narrative:       Clinical History:  persistent severe headache. No injury.    Ordering Comments:   None.      Study Notes:    Patient presents with headache. Symptoms began about 7 hours ago and patient has a hx of migraines     Examination:  CT HEAD WO CONTRAST    TECHNIQUE:  5 mm helical images  obtained from the skull base through the vertex without contrast.  2.5 mm reconstructed bone algorithm, and 3 mm sagittal and coronal reformatted images provided.  CT images were acquired using Automated Exposure Control for dose   reduction.     COMPARISON:   None available    FINDINGS:   BRAIN PARENCHYMA: No acute hemorrhage. No mass effect or herniation. Gray-white differentiation is maintained. White matter is within normal limits for age.    VENTRICLES/EXTRA-AXIAL SPACES: No hydrocephalus or extra-axial fluid collections.    EXTRACRANIAL STRUCTURES: Normal bones and soft tissues. Complete opacification of the maxillary sinuses as well as diffuse mucosal thickening in the ethmoid and sphenoid paranasal sinuses hypoplastic frontal sinuses.       Impression:       No acute bleed or infarct is identified on unenhanced CT. Severe  maxillary paranasal sinus disease with additional involvement in the ethmoid and sphenoid sinuses.    ReadingStation:[id number]          EKG:   Last EKG Result     None            Procedures       ED Course & MDM / Critical Care     Blood pressure (!) 151/91, pulse 65, temperature 98 F (36.7 C), temperature source Oral, resp. rate 18, height 1.702 m, weight (!) 160.8 kg, last menstrual period 08/30/2018, SpO2 98 %.    I reviewed the vital signs, nursing notes, past medical history, past surgical history, family history and social history.   I have reviewed the patient's previous charts.     O2 sat- saturation: 99% ; Oxygen use: RA Interpretation: Normal    D/Dx: intracranial hemorrhage, cluster headache, tension headache, sinusitis, and migraine     ED Course as of Sep 19 1453   Fri Sep 19, 2018   1329 Headache is no better after the benadryl, compazine and toradol.  PMP reviewed, negative. Dilaudid ordered. Labs ordered. CT head ordered and NS 1 liter ordered.     [ML]      ED Course User Index  [ML] Jens Som, NP     This patient presents to the Emergency Department with a headache.  Based on my history and examination, several differential diagnoses including cluster headache, tension headache, sinusitis, and migraine have been considered.  Serious or potentially life-threatening causes of the patient's symptoms like meningitis or causes of brain swelling seem unlikely.  Patient was treated with Benadryl, Compazine and Toradol IV with no relief.  CBC, CMP and CT of head was then ordered.  Patient also given Dilaudid.  Which did resolve the headache.  CT of head showed significant sinusitis.  And CBC the white count was elevated to 14.  CMP was unremarkable. ESR pending.  The patient is improved, is neurologically intact, and can be discharged home and managed with symptomatic care.I prescribed doxycyline for the sinusitis and Fioricet and Zofran should the migraine return.  Reviewed the diagnosis,  differential diagnosis, plan, medications and follow-up.  Patient understands she should eat when taking the antibiotic and to make sure to complete it.  Also doxycycline will make her sun sensitive.  Patient understands the safety and using the Fioricet.  She understands it can trigger rebound headaches.  She will use sparingly.  I advised the patient to return to the emergency department immediately should they develop worsening pain, fever, or any acute concerns .  Diagnostic impression and plan were discussed with the patient and.  Results  of lab/radiology tests were reviewed and discussed with the patient and is been. Questions were answered and concerns were addressed.  The patient was encouraged to follow-up with their primary care provider.  He does not have a primary care, therefore, she was referred to one.           Patient verbalized understanding and was agreeable to plan.     I discussed this case with the attending physician in the emergency department and they agree with the assessment and treatment plan.     Medications Given in ED:  E6-ED6-A - MAR ACTION REPORT  (last 24 hrs)         BERRY, MARK P, RN       Medication Name Action Time Action Site Route Rate Dose Reason Comments User     HYDROmorphone (DILAUDID) injection 1 mg 09/19/18 1346 Given  Intravenous  1 mg   Jesse Sans, RN     diphenhydrAMINE (BENADRYL) injection 25 mg 09/19/18 1229 Given  Intravenous  25 mg   Jesse Sans, RN     ketorolac (TORADOL) injection 30 mg 09/19/18 1229 Given  Intravenous  30 mg   Jesse Sans, RN     ondansetron [facility]) injection 4 mg 09/19/18 1229 Given  Intravenous  4 mg   Jesse Sans, RN     sodium chloride 0.9 % bolus 1,000 mL 09/19/18 1346 New Bag  Intravenous 1,000 mL/hr 1,000 mL   Jesse Sans, RN     sodium chloride 0.9 % bolus 1,000 mL 09/19/18 1447 Stopped  Intravenous     Jesse Sans, RN                Prescriptions:  New Prescriptions    BUTALBITAL-ACETAMINOPHEN-CAFFEINE (FIORICET, ESGIC)  50-325-40 MG PER TABLET    Take 1-2 tablets by mouth every 4 (four) hours as needed for Pain No more than 6/day.    DOXYCYCLINE (VIBRAMYCIN) 100 MG CAPSULE    Take 1 capsule (100 mg total) by mouth 2 (two) times daily for 14 days    ONDANSETRON (ZOFRAN) 8 MG TABLET    Take 1 tablet (8 mg total) by mouth every 6 (six) hours as needed for Nausea         Follow-up Information     Tichenor, Sherryll Burger, NP.    Specialty:  Nurse Practitioner  Why:  to establish.  Contact information:  [address]  Family and Internal Medicine  [place] [address]  [phone]                         Diagnosis / Disposition     Clinical Impression  1. Intractable migraine with aura with status migrainosus    2. Acute maxillary sinusitis, recurrence not specified        Disposition  ED Disposition     ED Disposition Condition Date/Time Comment    Discharge  Fri Sep 19, 2018  2:52 PM [facility] discharge to home/self care.    Condition at disposition: Stable            Jens Som, NP  11:58 AM      This chart was generated by an EMR and may contain errors, including typographical, or omissions not intended by the user.          Jens Som, NP  09/19/18 1506       Valora Piccolo A, NP  09/19/18  1856

## 2018-09-23 ENCOUNTER — Emergency Department
Admission: EM | Admit: 2018-09-23 | Discharge: 2018-09-23 | Disposition: A | Discharge status: Home / Self Care | Payer: BC Managed Care – PPO | Attending: Emergency Medicine | Admitting: Emergency Medicine

## 2018-09-23 DIAGNOSIS — G43019 Migraine without aura, intractable, without status migrainosus: Secondary | ICD-10-CM | POA: Insufficient documentation

## 2018-09-23 DIAGNOSIS — J0140 Acute pansinusitis, unspecified: Secondary | ICD-10-CM | POA: Insufficient documentation

## 2018-09-23 LAB — BASIC METABOLIC PANEL
Anion Gap: 16.2 mMol/L (ref 7.0–18.0)
BUN / Creatinine Ratio: 17.6 Ratio (ref 10.0–30.0)
BUN: 12 mg/dL (ref 7–22)
CO2: 21.5 mMol/L (ref 20.0–30.0)
Calcium: 8.9 mg/dL (ref 8.5–10.5)
Chloride: 104 mMol/L (ref 98–110)
Creatinine: 0.68 mg/dL (ref 0.60–1.20)
EGFR: 118 mL/min/{1.73_m2} (ref 60–150)
Glucose: 124 mg/dL — ABNORMAL HIGH (ref 71–99)
Osmolality Calculated: 277 mOsm/kg (ref 275–300)
Potassium: 3.7 mMol/L (ref 3.5–5.3)
Sodium: 138 mMol/L (ref 136–147)

## 2018-09-23 LAB — CBC AND DIFFERENTIAL
Basophils %: 1 % (ref 0.0–3.0)
Basophils Absolute: 0.1 10*3/uL (ref 0.0–0.3)
Eosinophils %: 1.1 % (ref 0.0–7.0)
Eosinophils Absolute: 0.1 10*3/uL (ref 0.0–0.8)
Hematocrit: 36.4 % (ref 36.0–48.0)
Hemoglobin: 12.4 gm/dL (ref 12.0–16.0)
Lymphocytes Absolute: 2 10*3/uL (ref 0.6–5.1)
Lymphocytes: 18 % (ref 15.0–46.0)
MCH: 29 pg (ref 28–35)
MCHC: 34 gm/dL (ref 31–36)
MCV: 84 fL (ref 80–100)
MPV: 7.7 fL (ref 6.0–10.0)
Monocytes Absolute: 0.6 10*3/uL (ref 0.1–1.7)
Monocytes: 5.3 % (ref 3.0–15.0)
Neutrophils %: 74.6 % (ref 42.0–78.0)
Neutrophils Absolute: 8.1 10*3/uL (ref 1.7–8.6)
PLT CT: 223 10*3/uL (ref 130–440)
RBC: 4.31 10*6/uL (ref 3.80–5.00)
RDW: 12.2 % (ref 10.5–14.5)
WBC: 10.9 10*3/uL (ref 4.0–11.0)

## 2018-09-23 LAB — HCG, SERUM, QUALITATIVE: BHCG Qualitative: NEGATIVE

## 2018-09-23 MED ORDER — PROMETHAZINE HCL 25 MG PO TABS
25.0000 mg | ORAL_TABLET | Freq: Four times a day (QID) | ORAL | 0 refills | Status: DC | PRN
Start: 2018-09-23 — End: 2021-02-24

## 2018-09-23 MED ORDER — HYDROCODONE-ACETAMINOPHEN 5-325 MG PO TABS
1.0000 | ORAL_TABLET | Freq: Four times a day (QID) | ORAL | 0 refills | Status: DC | PRN
Start: 2018-09-23 — End: 2021-02-24

## 2018-09-23 MED ORDER — DIPHENHYDRAMINE HCL 50 MG/ML IJ SOLN
25.00 mg | Freq: Once | INTRAMUSCULAR | Status: AC
Start: 2018-09-23 — End: 2018-09-23
  Administered 2018-09-23: 08:00:00 25 mg via INTRAVENOUS

## 2018-09-23 MED ORDER — KETOROLAC TROMETHAMINE 30 MG/ML IJ SOLN
30.00 mg | Freq: Once | INTRAMUSCULAR | Status: AC
Start: 2018-09-23 — End: 2018-09-23
  Administered 2018-09-23: 10:00:00 30 mg via INTRAVENOUS

## 2018-09-23 MED ORDER — VH HYDROMORPHONE HCL PF 1 MG/ML CARPUJECT
1.00 mg | Freq: Once | INTRAMUSCULAR | Status: AC
Start: 2018-09-23 — End: 2018-09-23
  Administered 2018-09-23: 10:00:00 1 mg via INTRAVENOUS

## 2018-09-23 MED ORDER — SODIUM CHLORIDE 0.9 % IV BOLUS
1000.00 mL | Freq: Once | INTRAVENOUS | Status: AC
Start: 2018-09-23 — End: 2018-09-23
  Administered 2018-09-23: 08:00:00 1000 mL via INTRAVENOUS

## 2018-09-23 MED ORDER — VH HYDROMORPHONE HCL PF 1 MG/ML CARPUJECT
INTRAMUSCULAR | Status: AC
Start: 2018-09-23 — End: ?
  Filled 2018-09-23: qty 1

## 2018-09-23 MED ORDER — PROCHLORPERAZINE EDISYLATE 10 MG/2ML IJ SOLN
10.00 mg | Freq: Once | INTRAMUSCULAR | Status: AC
Start: 2018-09-23 — End: 2018-09-23
  Administered 2018-09-23: 08:00:00 10 mg via INTRAVENOUS

## 2018-09-23 MED ORDER — KETOROLAC TROMETHAMINE 30 MG/ML IJ SOLN
INTRAMUSCULAR | Status: AC
Start: 2018-09-23 — End: ?
  Filled 2018-09-23: qty 1

## 2018-09-23 MED ORDER — METHYLPREDNISOLONE SODIUM SUCC 125 MG IJ SOLR
125.00 mg | Freq: Once | INTRAMUSCULAR | Status: AC
Start: 2018-09-23 — End: 2018-09-23
  Administered 2018-09-23: 08:00:00 125 mg via INTRAVENOUS

## 2018-09-23 MED ORDER — PROCHLORPERAZINE EDISYLATE 10 MG/2ML IJ SOLN
INTRAMUSCULAR | Status: AC
Start: 2018-09-23 — End: ?
  Filled 2018-09-23: qty 2

## 2018-09-23 MED ORDER — DIPHENHYDRAMINE HCL 50 MG/ML IJ SOLN
INTRAMUSCULAR | Status: AC
Start: 2018-09-23 — End: ?
  Filled 2018-09-23: qty 1

## 2018-09-23 MED ORDER — METHYLPREDNISOLONE SODIUM SUCC 125 MG IJ SOLR
INTRAMUSCULAR | Status: AC
Start: 2018-09-23 — End: ?
  Filled 2018-09-23: qty 2

## 2018-09-23 NOTE — Discharge Instructions (Signed)
Headache, Migraine    You have been diagnosed with a recurrent migraine headache.    Headache is a very common complaint. Most headaches are not dangerous. Your symptoms today sound like your usual headache. It is OK for you to go home. It is important, however, to follow up with your doctor or neurologist.    Migraines are treated with medication to reduce pain. Medications specifically designed for migraine headaches are usually more effective than other pain medications. Ask your doctor about migraine medicines.    Take your medication as directed. This is especially important if your doctor has placed you on a daily medication to prevent headaches.    YOU SHOULD SEEK MEDICAL ATTENTION IMMEDIATELY, EITHER HERE OR AT THE NEAREST EMERGENCY DEPARTMENT, IF ANY OF THE FOLLOWING OCCURS:   Your headache gets worse or does not improve with medication.   You have head pain that is different from your normal headache.   You have a very severe headache that begins suddenly (like an explosion in your head or like a thunderclap).   You have a fever (temperature higher than 100.4F / 38C).   You have loss of feeling or tingling in your arms or legs.   You pass out.   You develop vision problems.   You vomit and cannot take medication or keep medication down.

## 2018-09-23 NOTE — ED Notes (Addendum)
Family member Out to desk, pt requesting to speak with MD. MD aware

## 2018-09-23 NOTE — ED Provider Notes (Signed)
Physician/Midlevel provider first contact with patient: 09/23/18 0753         [facility] EMERGENCY DEPARTMENT History and Physical Exam      Patient Name: Debra Gomez, Debra Gomez  Encounter Date:  09/23/2018  Attending Physician: Justice Britain, MD  PCP: Marisa Sprinkles, MD  Patient DOB:  [birth year]  MRN:  [id number]  Room:  E11/EDA11-A      History of Presenting Illness     Chief complaint: Headache    HPI/ROS is limited by: none  HPI/ROS given by: patient and family    Location: frontal  Duration: 1 hour  Severity: severe    Debra Gomez is a 32 y.o. female who presents with frontal headache which she states recurrued this AM suddenly when she bent forward.  Contrary to nurses notes patient tells me that she had no headache at all yesterday and prior headache which she was seen in ED for had completely resolved prior to recurring this AM.  She reports HA worse with light or noise.  She reports omitted as well.  She reports had a similar HA 10 years ago which was a migraine.  She states has taken doxy from recent rx but denies any nasal congestion, states recently had "a cold".  She denies possible carbon monoxide exposure      Review of Systems     Review of Systems   Constitutional: Negative for fever.   HENT: Negative for congestion.    Cardiovascular: Negative for chest pain.   Gastrointestinal: Positive for nausea and vomiting. Negative for abdominal pain.   Musculoskeletal: Negative for neck pain.   Neurological: Positive for dizziness and headaches. Negative for sensory change, focal weakness and loss of consciousness.   All other systems reviewed and are negative.      Allergies     Pt is allergic to sulfa antibiotics.    Medications     No current facility-administered medications for this encounter.     Current Outpatient Medications:   .  butalbital-acetaminophen-caffeine (FIORICET, ESGIC) 50-325-40 MG per tablet, Take 1-2 tablets by mouth every 4 (four) hours as needed for Pain No more than 6/day., Disp: 12 tablet,  Rfl: 0  .  doxycycline (VIBRAMYCIN) 100 MG capsule, Take 1 capsule (100 mg total) by mouth 2 (two) times daily for 14 days, Disp: 28 capsule, Rfl: 0  .  HYDROcodone-acetaminophen (NORCO) 5-325 MG per tablet, Take 1-2 tablets by mouth every 6 (six) hours as needed for Pain, Disp: 12 tablet, Rfl: 0  .  ondansetron (ZOFRAN) 8 MG tablet, Take 1 tablet (8 mg total) by mouth every 6 (six) hours as needed for Nausea, Disp: 20 tablet, Rfl: 0  .  promethazine (PHENERGAN) 25 MG tablet, Take 1 tablet (25 mg total) by mouth every 6 (six) hours as needed for Nausea, Disp: 12 tablet, Rfl: 0     Past Medical History     Pt has a past medical history of Migraines and Reactive airway disease.    Past Surgical History     Pt has no past surgical history on file.    Family History     The family history is not on file.    Social History     Pt reports that she has been smoking. She has been smoking about 0.50 packs per day. She has never used smokeless tobacco. She reports current alcohol use. She reports that she does not use drugs.    Physical Exam     Blood pressure (!) 156/91, pulse  66, temperature 97.9 F (36.6 C), temperature source Oral, resp. rate 18, height 1.702 m, weight (!) 163.9 kg, last menstrual period 08/30/2018, SpO2 99 %.    Constitutional: Vital signs reviewed. Well appearing.  Head: Normocephalic, atraumatic  Eyes: Conjunctiva and sclera are normal.  No injection or discharge.  Ears, Nose, Throat:  Normal external examination of the nose and ears.    Neck: Normal range of motion. Trachea midline.  Respiratory/Chest: Clear to auscultation. No respiratory distress.   Cardiovascular: Regular rate and rhythm.  Abdomen:  No rebound or guarding. Soft.  Non-tender.  Back: no cva tenderness    Upper Extremity:  No edema. No cyanosis.  Lower Extremity:  No edema. No cyanosis.  Skin: Warm and dry. No rash.  Psychiatric:  Normal affect.  Normal insight  Neuro: CN 2-12 intact.  FNF intact.  PERRL    Orders Placed     Orders  Placed This Encounter   Procedures   . Beta HCG, Qual   . BMP   . CBC   . Pulse Ox   . Saline lock IV       Diagnostic Results       The results of the diagnostic studies below have been reviewed by myself:    Labs  Results     Procedure Component Value Units Date/Time    BMP [[id number]]  (Abnormal) Collected:  09/23/18 0822    Specimen:  Plasma Updated:  09/23/18 0856     Sodium 138 mMol/L      Potassium 3.7 mMol/L      Chloride 104 mMol/L      CO2 21.5 mMol/L      Calcium 8.9 mg/dL      Glucose 518 mg/dL      Creatinine 8.41 mg/dL      BUN 12 mg/dL      Anion Gap 66.0 mMol/L      BUN/Creatinine Ratio 17.6 Ratio      EGFR 118 mL/min/1.53m2      Osmolality Calculated 277 mOsm/kg     Beta HCG, Qual [[id number]] Collected:  09/23/18 0822    Specimen:  Plasma Updated:  09/23/18 0849     BHCG Qual Negative    CBC [[id number]] Collected:  09/23/18 0822    Specimen:  Blood Updated:  09/23/18 0843     WBC 10.9 K/cmm      RBC 4.31 M/cmm      Hemoglobin 12.4 gm/dL      Hematocrit 02.5 %      MCV 84 fL      MCH 29 pg      MCHC 34 gm/dL      RDW 42.7 %      PLT CT 223 K/cmm      MPV 7.7 fL      NEUTROPHIL % 74.6 %      Lymphocytes 18.0 %      Monocytes 5.3 %      Eosinophils % 1.1 %      Basophils % 1.0 %      Neutrophils Absolute 8.1 K/cmm      Lymphocytes Absolute 2.0 K/cmm      Monocytes Absolute 0.6 K/cmm      Eosinophils Absolute 0.1 K/cmm      BASO Absolute 0.1 K/cmm           Radiologic Studies  Radiology Results (24 Hour)     ** No results found for the last 24 hours. **  EKG: none      MDM / Critical Care     Blood pressure (!) 156/91, pulse 66, temperature 97.9 F (36.6 C), temperature source Oral, resp. rate 18, height 1.702 m, weight (!) 163.9 kg, last menstrual period 08/30/2018, SpO2 99 %.    DDX incldues migraine, sinusitis, ICH among others    ED Course     Pt reports improved but not resolved in ED.  priro CT reviewed.  Reassuring exam and pt reports complete resolution of HA yesterday prior to  returning today when bending suggesting migraine or sinusitis.  This was d/w pt and family at length.  Pt will retrun for recheck if worse or not resolved in 1-2 days         Procedures         Diagnosis / Disposition     Clinical Impression  1. Intractable migraine without aura and without status migrainosus    2. Acute pansinusitis, recurrence not specified        Disposition  ED Disposition     ED Disposition Condition Date/Time Comment    Discharge  Tue Sep 23, 2018 11:11 AM Claudine Mouton discharge to home/self care.    Condition at disposition: Stable    Ambulated to exit with family          Prescriptions  Discharge Medication List as of 09/23/2018 11:07 AM      START taking these medications    Details   HYDROcodone-acetaminophen (NORCO) 5-325 MG per tablet Take 1-2 tablets by mouth every 6 (six) hours as needed for Pain, Starting Tue 09/23/2018, Print      promethazine (PHENERGAN) 25 MG tablet Take 1 tablet (25 mg total) by mouth every 6 (six) hours as needed for Nausea, Starting Tue 09/23/2018, Print                        Justice Britain, MD  09/23/18 1231

## 2018-10-17 ENCOUNTER — Ambulatory Visit (INDEPENDENT_AMBULATORY_CARE_PROVIDER_SITE_OTHER): Payer: Medicaid Other | Admitting: Women's Health

## 2018-10-17 ENCOUNTER — Encounter: Payer: Self-pay | Admitting: Women's Health

## 2018-10-17 DIAGNOSIS — N898 Other specified noninflammatory disorders of vagina: Secondary | ICD-10-CM

## 2018-10-17 DIAGNOSIS — A599 Trichomoniasis, unspecified: Secondary | ICD-10-CM | POA: Diagnosis not present

## 2018-10-17 LAB — POCT WET PREP (WET MOUNT): CLUE CELLS WET PREP WHIFF POC: POSITIVE

## 2018-10-17 NOTE — Progress Notes (Signed)
POSTPARTUM VISIT Patient name: Carolyn Cox MRN [id number]  Date of birth: 04-19-1986 Chief Complaint:   Postpartum Care  History of Present Illness:   Carolyn Cox is a 33 y.o. [phone] African American female being seen today for a postpartum visit. She is 8 weeks postpartum following a spontaneous vaginal delivery at 38.3 gestational weeks at [facility]. Anesthesia: none. Laceration: none. I have fully reviewed the prenatal and intrapartum course. Pregnancy complicated by Hep C, polysubstance abuse (including cocaine), limited prenatal care, incarceration. H/O acute systolic heart failure (EF 25-30%) w/ repeat EF later of 55%. Postpartum course has been complicated by incarceration. Pt's mom currently has custody of the baby. Reports yellow vaginal d/c since giving birth. No itching/irritation/odor. Some burning w/ urination. Bleeding no bleeding. Bowel function is normal. Bladder function is normal.  Patient is not sexually active. Last sexual activity: prior to pregnancy per pt.  Contraception method is wants IUD.  [place] Postpartum Depression Screening: negative. Score 0.   Last pap 2019.  Results were normal per pt .  No LMP recorded.  Baby's course has been uncomplicated. Baby is feeding by bottle.  Review of Systems:   Pertinent items are noted in HPI Denies Abnormal vaginal discharge w/ itching/odor/irritation, headaches, visual changes, shortness of breath, chest pain, abdominal pain, severe nausea/vomiting, or problems with urination or bowel movements. Pertinent History Reviewed:  Reviewed past medical,surgical, obstetrical and family history.  Reviewed problem list, medications and allergies. OB History  Gravida Para Term Preterm AB Living  5 3 3  0 2 2  SAB TAB Ectopic Multiple Live Births  0 0 0   2    # Outcome Date GA Lbr Len/2nd Weight Sex Delivery Anes PTL Lv  5 Term 08/21/18 [redacted]w[redacted]d      N   4 Term 07/17/16 [redacted]w[redacted]d / 00:02 6 lb 8 oz (2.948 kg) F Vag-Spont None  LIV  3  Term 12/08/08 [redacted]w[redacted]d  8 lb (3.629 kg) F Vag-Spont   LIV  2 AB 2009     TAB     1 AB 2007     TAB      Physical Assessment:   Vitals:   10/17/18 1002  BP: 131/85  Pulse: 71  Weight: 144 lb 6.4 oz (65.5 kg)  Height: 5' 6.5" (1.689 m)  Body mass index is 22.96 kg/m.       Physical Examination:   General appearance: alert, well appearing, and in no distress  Mental status: alert, oriented to person, place, and time  Skin: warm & dry   Cardiovascular: normal heart rate noted   Respiratory: normal respiratory effort, no distress   Breasts: deferred, no complaints   Abdomen: soft, non-tender   Pelvic: VULVA: normal appearing vulva with no masses, tenderness or lesions, VAGINA: normal appearing vagina with normal color and yellow malodorous discharge, no lesions, CERVIX: normal appearing cervix without discharge or lesions, UTERUS: uterus is normal size, shape, consistency and nontender  Rectal: no hemorrhoids  Extremities: no edema       Results for orders placed or performed in visit on 10/17/18 (from the past 24 hour(s))  POCT Wet Prep [facility])   Collection Time: 10/17/18  1:24 PM  Result Value Ref Range   Source Wet Prep POC vaginal    WBC, Wet Prep HPF POC many    Bacteria Wet Prep HPF POC Few Few   BACTERIA WET PREP MORPHOLOGY POC     Clue Cells Wet Prep HPF POC Few (A) None  Clue Cells Wet Prep Whiff POC Positive Whiff    Yeast Wet Prep HPF POC None None   KOH Wet Prep POC     Trichomonas Wet Prep HPF POC Present (A) Absent    Assessment & Plan:  1) Postpartum exam 2) 8 wks s/p SVB at [facility] 3) Bottlefeeding 4) Depression screening 5) Contraception counseling, pt prefers abstinence until IUD  6) Trichomonas> written rx for metronidazole (given to guard), POC in 3-4wks (IUD visit) 7) Dysuria> could be from trich, will send urine cx  Meds: No orders of the defined types were placed in this encounter.   Follow-up: Return for 3-4wks for IUD insertion.   Orders  Placed This Encounter  Procedures  . Urine Culture  . GC/Chlamydia Probe Amp  . POCT Wet Prep [facility])    Cheral Marker CNM, [facility] 10/17/2018 1:24 PM

## 2018-10-17 NOTE — Patient Instructions (Signed)
NO SEX UNTIL AFTER YOU GET YOUR BIRTH CONTROL   Trichomoniasis Trichomoniasis is an STI (sexually transmitted infection) that can affect both women and men. In women, the outer area of the female genitalia (vulva) and the vagina are affected. In men, the penis is mainly affected, but the prostate and other reproductive organs can also be involved. This condition can be treated with medicine. It often has no symptoms (is asymptomatic), especially in men. What are the causes? This condition is caused by an organism called Trichomonas vaginalis. Trichomoniasis most often spreads from person to person (is contagious) through sexual contact. What increases the risk? The following factors may make you more likely to develop this condition:  Having unprotected sexual intercourse.  Having sexual intercourse with a partner who has trichomoniasis.  Having multiple sexual partners.  Having had previous trichomoniasis infections or other STIs. What are the signs or symptoms? In women, symptoms of trichomoniasis include:  Abnormal vaginal discharge that is clear, white, gray, or yellow-green and foamy and has an unusual "fishy" odor.  Itching and irritation of the vagina and vulva.  Burning or pain during urination or sexual intercourse.  Genital redness and swelling. In men, symptoms of trichomoniasis include:  Penile discharge that may be foamy or contain pus.  Pain in the penis. This may happen only when urinating.  Itching or irritation inside the penis.  Burning after urination or ejaculation. How is this diagnosed? In women, this condition may be found during a routine Pap test or physical exam. It may be found in men during a routine physical exam. Your health care provider may perform tests to help diagnose this infection, such as:  Urine tests (men and women).  The following in women: ? Testing the pH of the vagina. ? A vaginal swab test that checks for the Trichomonas vaginalis  organism. ? Testing vaginal secretions. Your health care provider may test you for other STIs, including HIV (human immunodeficiency virus). How is this treated? This condition is treated with medicine taken by mouth (orally), such as metronidazole or tinidazole to fight the infection. Your sexual partner(s) may also need to be tested and treated.  If you are a woman and you plan to become pregnant or think you may be pregnant, tell your health care provider right away. Some medicines that are used to treat the infection should not be taken during pregnancy. Your health care provider may recommend over-the-counter medicines or creams to help relieve itching or irritation. You may be tested for infection again 3 months after treatment. Follow these instructions at home:  Take and use over-the-counter and prescription medicines, including creams, only as told by your health care provider.  Do not have sexual intercourse until one week after you finish your medicine, or until your health care provider approves. Ask your health care provider when you may resume sexual intercourse.  (Women) Do not douche or wear tampons while you have the infection.  Discuss your infection with your sexual partner(s). Make sure that your partner gets tested and treated, if necessary.  Keep all follow-up visits as told by your health care provider. This is important. How is this prevented?  Use condoms every time you have sex. Using condoms correctly and consistently can help protect against STIs.  Avoid having multiple sexual partners.  Talk with your sexual partner about any symptoms that either of you may have, as well as any history of STIs.  Get tested for STIs and STDs (sexually transmitted diseases) before  you have sex. Ask your partner to do the same.  Do not have sexual contact if you have symptoms of trichomoniasis or another STI. Contact a health care provider if:  You still have symptoms after  you finish your medicine.  You develop pain in your abdomen.  You have pain when you urinate.  You have bleeding after sexual intercourse.  You develop a rash.  You feel nauseous or you vomit.  You plan to become pregnant or think you may be pregnant. Summary  Trichomoniasis is an STI (sexually transmitted infection) that can affect both women and men.  This condition often has no symptoms (is asymptomatic), especially in men.  You should not have sexual intercourse until one week after you finish your medicine, or until your health care provider approves. Ask your health care provider when you may resume sexual intercourse.  Discuss your infection with your sexual partner. Make sure that your partner gets tested and treated, if necessary. This information is not intended to replace advice given to you by your health care provider. Make sure you discuss any questions you have with your health care provider. Document Released: 03/20/2001 Document Revised: 08/17/2016 Document Reviewed: 08/17/2016 Elsevier Interactive Patient Education  2019 [employer].

## 2018-10-19 LAB — URINE CULTURE: ORGANISM ID, BACTERIA: NO GROWTH

## 2018-11-06 ENCOUNTER — Ambulatory Visit: Payer: PRIVATE HEALTH INSURANCE | Admitting: Advanced Practice Midwife

## 2018-12-18 IMAGING — CR DG CHEST 2V
2 series · 2 of 2 positions shown · non-contrast
Comparison: Chest x-ray of May 22, 2017.

CLINICAL DATA: Palpitations, tachycardia. Onset after ingesting
coughing this morning. First time occurrence.

EXAM:
CHEST  2 VIEW

[chest pa]
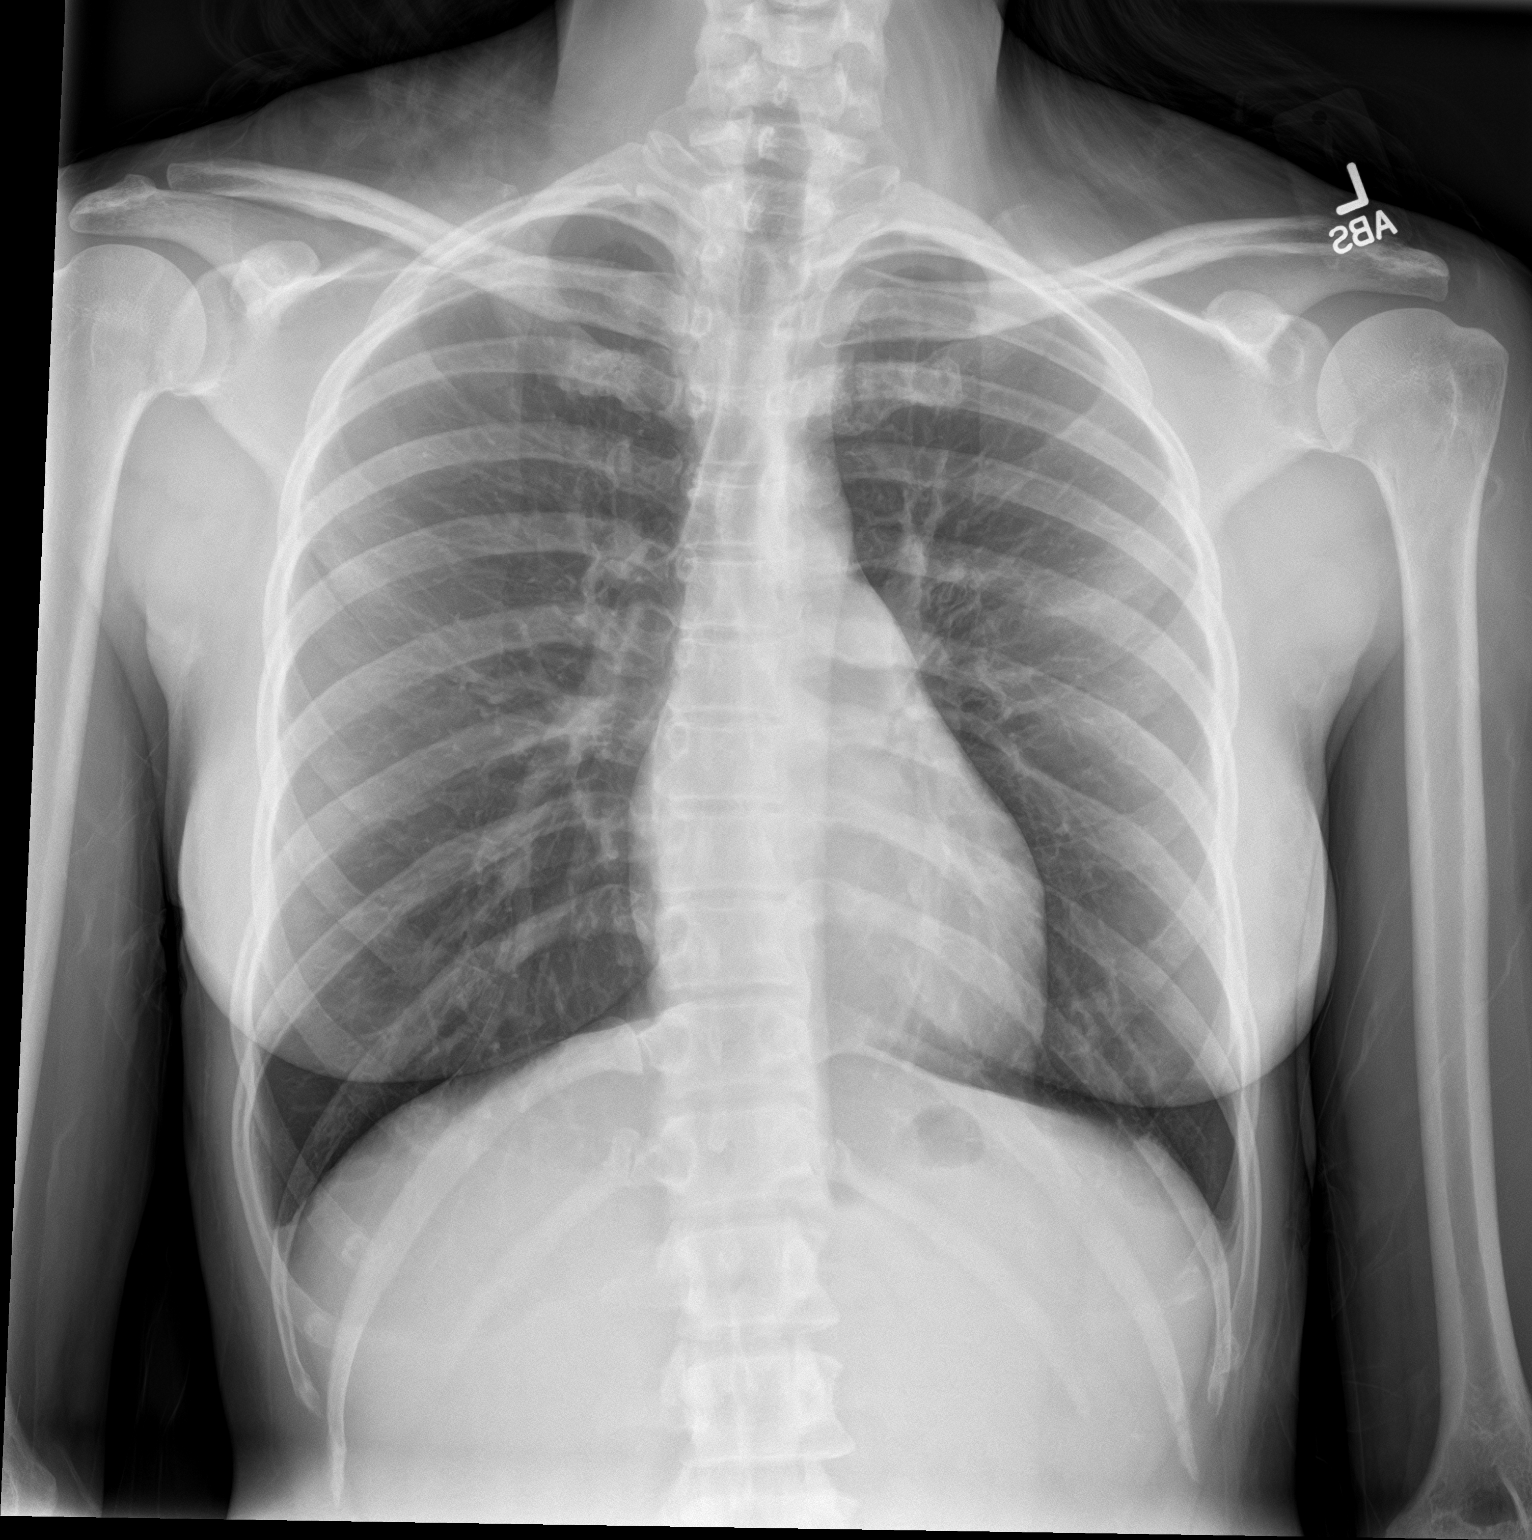

[chest lat]
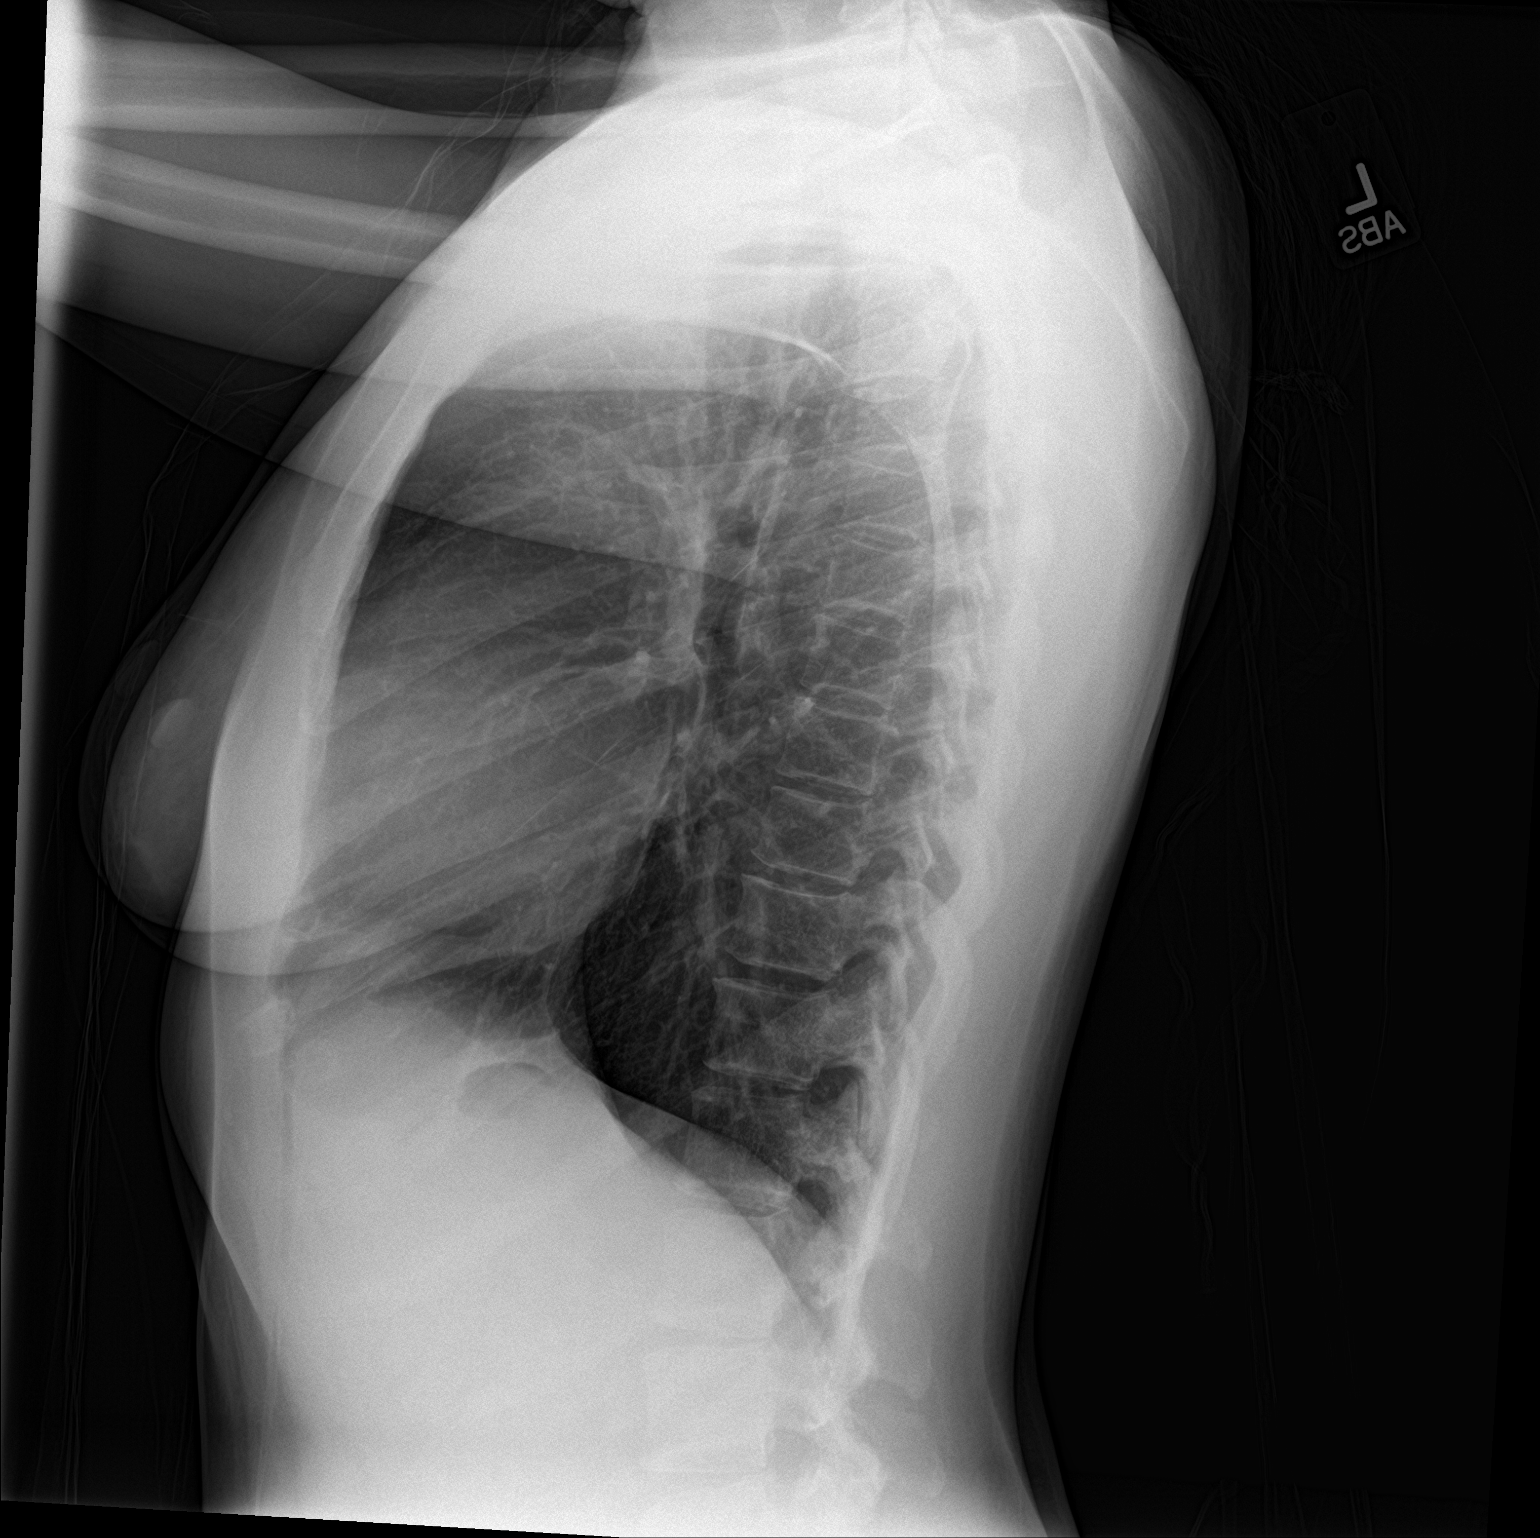

[2 of 2 positions shown; findings below may reference images not displayed]

FINDINGS: The lungs are hyperinflated with mild hemidiaphragm flattening.
There is no focal infiltrate. There is no pleural effusion or
pneumothorax. The heart and pulmonary vascularity are normal. The
mediastinum is normal in width. There is gentle curvature of the
midthoracic spine convex toward the right at appears positional.
IMPRESSION: Mild hyperinflation may be voluntary or may reflect underlying
reactive airway disease and the patient's smoking history. No acute
cardiopulmonary abnormality is observed.

## 2019-04-25 ENCOUNTER — Other Ambulatory Visit: Payer: Self-pay

## 2019-04-25 ENCOUNTER — Encounter (HOSPITAL_COMMUNITY): Payer: Self-pay

## 2019-04-25 ENCOUNTER — Ambulatory Visit (HOSPITAL_COMMUNITY)
Admission: EM | Admit: 2019-04-25 | Discharge: 2019-04-25 | Disposition: A | Discharge status: Home / Self Care | Payer: Medicaid Other | Attending: Family Medicine | Admitting: Family Medicine

## 2019-04-25 DIAGNOSIS — L03011 Cellulitis of right finger: Secondary | ICD-10-CM

## 2019-04-25 MED ORDER — DOXYCYCLINE HYCLATE 100 MG PO CAPS: 100.0000 mg | ORAL_CAPSULE | Freq: Two times a day (BID) | ORAL | 0 refills | Status: DC

## 2019-04-25 NOTE — ED Provider Notes (Signed)
[facility]    CSN: [id number] Arrival date & time: 04/25/19  1430     History   Chief Complaint Chief Complaint  Patient presents with  . Ingrown Toenail    HPI Carolyn Cox is a 33 y.o. female with history of acute systolic heart failure, chronic hepatitis C, polysubstance abuse, presenting for right thumb pain and swelling since 2 days ago.  Patient endorsing a throbbing-like sensation.  States she ripped out an ingrown nail earlier this week.  Has tried Tylenol and ibuprofen without relief.  Took 1 of her boyfriends Tylenol #5 today which helped take the edge off.  Patient denies fever, myalgias, rash.    Past Medical History:  Diagnosis Date  . Acute systolic heart failure (HCC) 09/2017  . Chronic hepatitis C (HCC)   . Nonischemic cardiomyopathy (HCC) 09/2017   EF 25-30% EF  . Polysubstance abuse (HCC)   . Sciatica     Patient Active Problem List   Diagnosis Date Noted  . Trichomonas infection 10/17/2018  . Acute systolic heart failure (HCC)   . 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 Cocaine abuse (HCC) 09/19/2017  . 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 Elevated troponin 09/18/2017    Past Surgical History:  Procedure Laterality Date  . LEFT HEART CATH AND CORONARY ANGIOGRAPHY N/A 09/20/2017   Procedure: LEFT HEART CATH AND CORONARY ANGIOGRAPHY;  Surgeon: Iran OuchArida, Muhammad A, MD;  Location: [facility] INVASIVE CV LAB;  Service: Cardiovascular;  Laterality: N/A;  . MANDIBLE FRACTURE SURGERY      OB History    Gravida  5   Para  3   Term  3   Preterm  0   AB  2   Living  2     SAB  0   TAB  0   Ectopic  0   Multiple      Live Births  2            Home Medications    Prior to Admission medications   Medication Sig Start Date End Date Taking? Authorizing Provider  doxycycline (VIBRAMYCIN) 100 MG capsule Take 1 capsule (100 mg total) by mouth 2 (two) times daily. 04/25/19   Hall-Potvin, [place], PA-C    Family  History Family History  Problem Relation Age of Onset  . Hypertension Mother   . Hypertension Maternal Grandmother   . Kidney disease Maternal Grandmother   . Kidney disease Maternal Aunt     Social History Social History   Tobacco Use  . Smoking status: Current Some Day Smoker    Types: Cigarettes  . Smokeless tobacco: Never Used  . Tobacco comment: less than 1 pack/wk.  Substance Use Topics  . Alcohol use: Not Currently  . Drug use: Yes    Types: Cocaine, Marijuana    Comment: +UDS for cocaine and MJ 07/27/18 Past use of amphetamines     Allergies   Patient has no known allergies.   Review of Systems Review of Systems  Constitutional: Negative for fatigue and fever.  HENT: Negative for ear pain, sinus pain, sore throat and voice change.   Eyes: Negative for pain, redness and visual disturbance.  Respiratory: Negative for cough and shortness of breath.   Cardiovascular: Negative for chest pain and palpitations.  Gastrointestinal: Negative for abdominal pain, diarrhea and vomiting.  Musculoskeletal: Positive for joint swelling. Negative for arthralgias and myalgias.  Skin: Positive for wound. Negative for rash.  Neurological: Negative for syncope and headaches.  Physical Exam Triage Vital Signs ED Triage Vitals [04/25/19 1506]  Enc Vitals Group     BP (!) 152/94     Pulse Rate 73     Resp 16     Temp 99.2 F (37.3 C)     Temp Source Oral     SpO2 100 %     Weight      Height      Head Circumference      Peak Flow      Pain Score 9     Pain Loc      Pain Edu?      Excl. in [place]?    No data found.  Updated Vital Signs BP (!) 152/94 (BP Location: Left Arm)   Pulse 73   Temp 99.2 F (37.3 C) (Oral)   Resp 16   LMP 03/22/2019   SpO2 100%   Visual Acuity Right Eye Distance:   Left Eye Distance:   Bilateral Distance:    Right Eye Near:   Left Eye Near:    Bilateral Near:     Physical Exam Constitutional:      General: She is not in acute  distress. HENT:     Head: Normocephalic and atraumatic.  Eyes:     General: No scleral icterus.    Pupils: Pupils are equal, round, and reactive to light.  Cardiovascular:     Rate and Rhythm: Normal rate.  Pulmonary:     Effort: Pulmonary effort is normal.  Skin:    Coloration: Skin is not jaundiced or pale.     Comments: Right thumb with swelling at distal aspect, tender to palpation, NVI.  Neurological:     Mental Status: She is alert and oriented to person, place, and time.      UC Treatments / Results  Labs (all labs ordered are listed, but only abnormal results are displayed) Labs Reviewed - No data to display  EKG   Radiology No results found.  Procedures Incision and Drainage  Date/Time: 04/25/2019 8:50 PM Performed by: Shea Evans, PA-C Authorized by: Mardella Layman, MD   Consent:    Consent obtained:  Verbal   Consent given by:  Patient   Risks discussed:  Bleeding, incomplete drainage, pain and damage to other organs   Alternatives discussed:  No treatment Universal protocol:    Patient identity confirmed:  Verbally with patient Location:    Type:  Abscess   Location:  Upper extremity   Upper extremity location:  Finger   Finger location:  R thumb Pre-procedure details:    Skin preparation:  Hibiclens Anesthesia (see MAR for exact dosages):    Anesthesia method:  Nerve block   Block needle gauge:  27 G   Block anesthetic:  Lidocaine 2% w/o epi   Block technique:  Digital   Block injection procedure:  Negative aspiration for blood, introduced needle and anatomic landmarks palpated   Block outcome:  Anesthesia achieved Procedure type:    Complexity:  Simple Procedure details:    Needle aspiration: yes     Needle size:  18 G   Wound management:  Probed and deloculated, irrigated with saline and extensive cleaning   Drainage:  Purulent and bloody   Drainage amount:  Moderate   Wound treatment:  Wound left open   Packing materials:   None Post-procedure details:    Patient tolerance of procedure:  Tolerated well, no immediate complications   (including critical care time)  Medications Ordered in UC Medications -  No data to display  Initial Impression / Assessment and Plan / UC Course  I have reviewed the triage vital signs and the nursing notes.  Pertinent labs & imaging results that were available during my care of the patient were reviewed by me and considered in my medical decision making (see chart for details).     1.  Paronychia of right thumb Patient tolerated I&D well.  Due to, ability/risk factors in conjunction with viscosity of purulent discharge, will initiate doxycycline to reduce risk of subsequent infection/cellulitis.  Return precautions discussed, patient verbalized understanding and is agreeable to plan. Final Clinical Impressions(s) / UC Diagnoses   Final diagnoses:  Paronychia of right thumb     Discharge Instructions     Take antibiotic as prescribed. Take tylenol and ibuprofen as needed for pain. Keep area clean and dry. Use ice for swelling, hot epsom salt soaks for additional pain relief.    ED Prescriptions    Medication Sig Dispense Auth. Provider   doxycycline (VIBRAMYCIN) 100 MG capsule Take 1 capsule (100 mg total) by mouth 2 (two) times daily. 10 capsule Hall-Potvin, [place], PA-C     Controlled Substance Prescriptions [place] Controlled Substance Registry consulted? Not Applicable   Quincy Sheehan, [place] 04/25/19 2052

## 2019-04-25 NOTE — ED Triage Notes (Signed)
Pt present a ingrown nail in her right hand/thumb. Pt thumb is swollen

## 2019-04-25 NOTE — Discharge Instructions (Signed)
Take antibiotic as prescribed. Take tylenol and ibuprofen as needed for pain. Keep area clean and dry. Use ice for swelling, hot epsom salt soaks for additional pain relief.

## 2019-05-10 ENCOUNTER — Emergency Department: Admission: EM | Admit: 2019-05-10 | Discharge: 2019-05-10 | Discharge status: Against Medical Advice | Payer: Self-pay

## 2019-05-10 ENCOUNTER — Emergency Department: Admission: EM | Admit: 2019-05-10 | Discharge: 2019-05-10 | Discharge status: Absent without leave | Payer: Self-pay

## 2019-05-10 NOTE — ED Notes (Signed)
Pt states that she does not want to be seen and she has someone coming to pick her up.

## 2019-05-10 NOTE — ED Notes (Signed)
Pt observed walking outside. 

## 2019-05-19 IMAGING — US US OB COMP LESS 14 WK
1 series · 14 of 28 positions shown · non-contrast
Comparison: Pelvic ultrasound performed 08/13/2008

CLINICAL DATA: Acute onset of generalized abdominal pain.

EXAM:
OBSTETRIC <14 WK ULTRASOUND
TECHNIQUE: Transabdominal ultrasound was performed for evaluation of the
gestation as well as the maternal uterus and adnexal regions.

[Series 1: us ob comp less 14 wk · 14 of 44 slices shown]
[im 2/44]
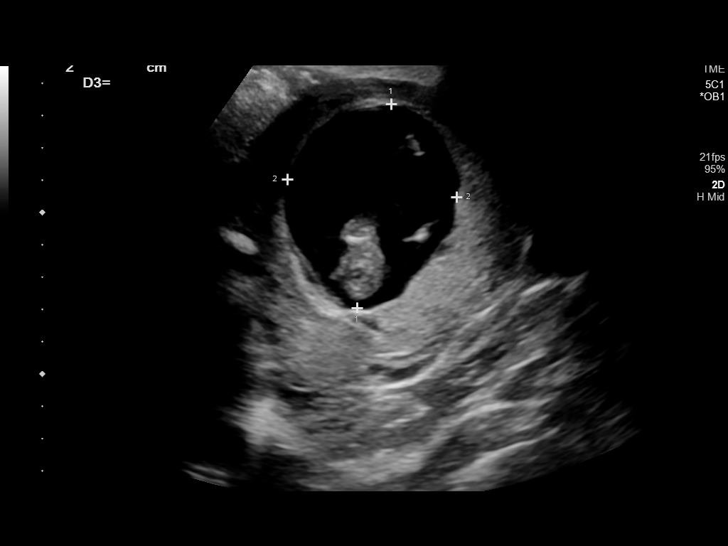
[im 5/44]
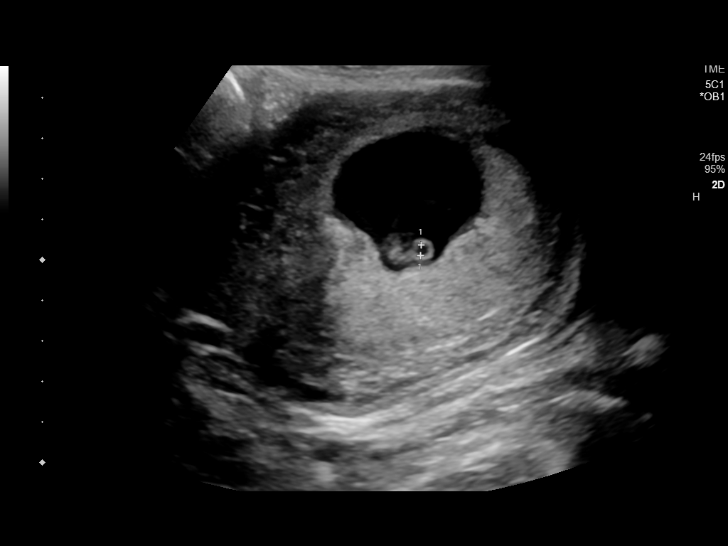
[im 8/44]
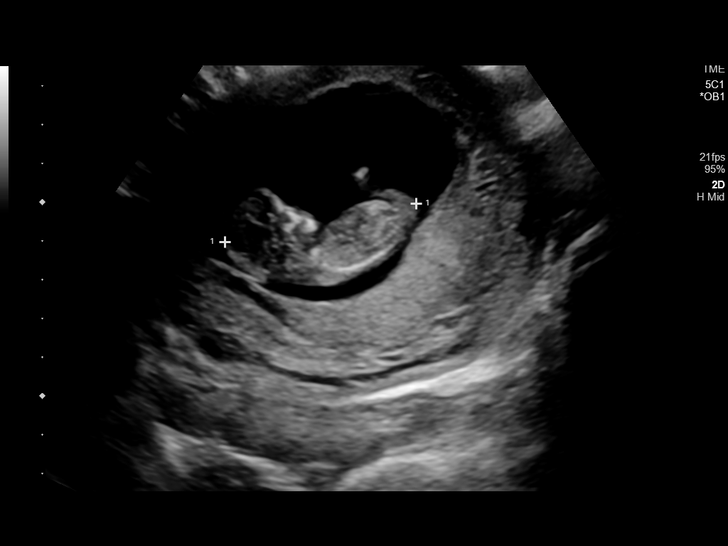
[im 12/44]
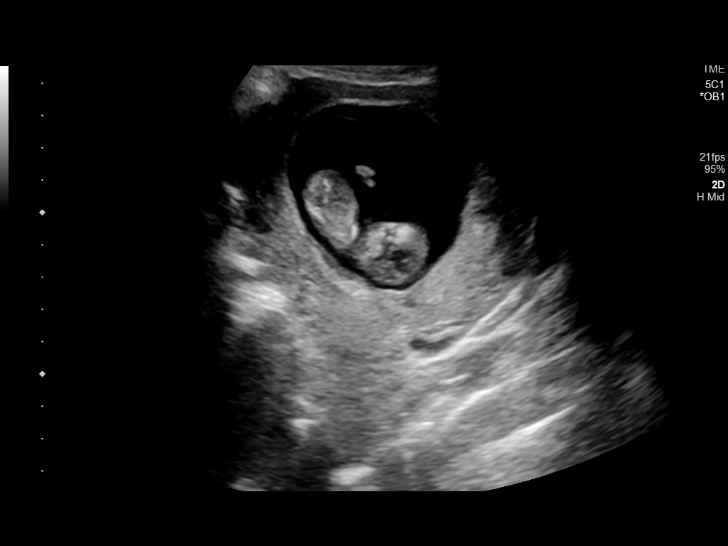
[im 15/44]
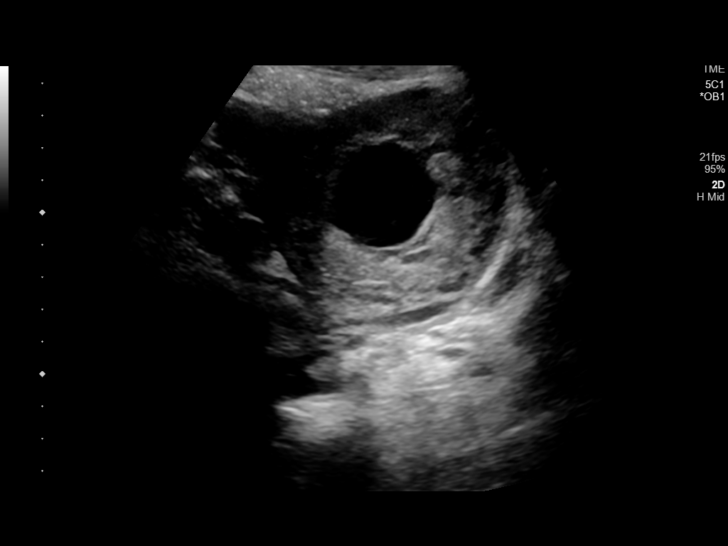
[im 18/44]
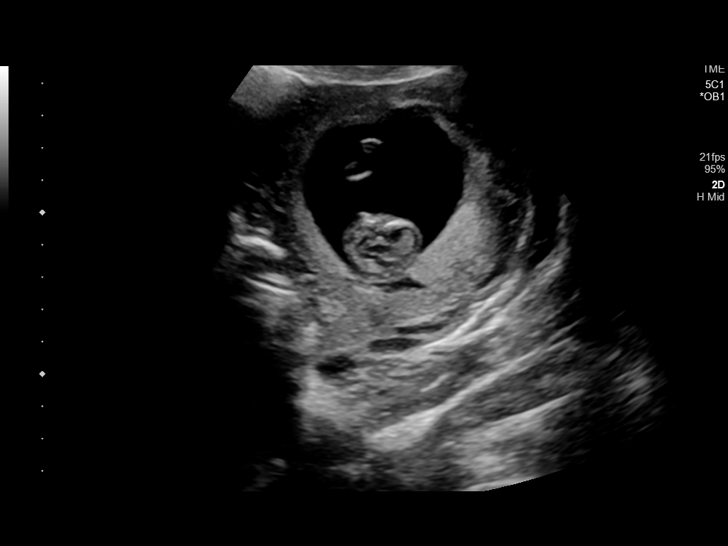
[im 21/44]
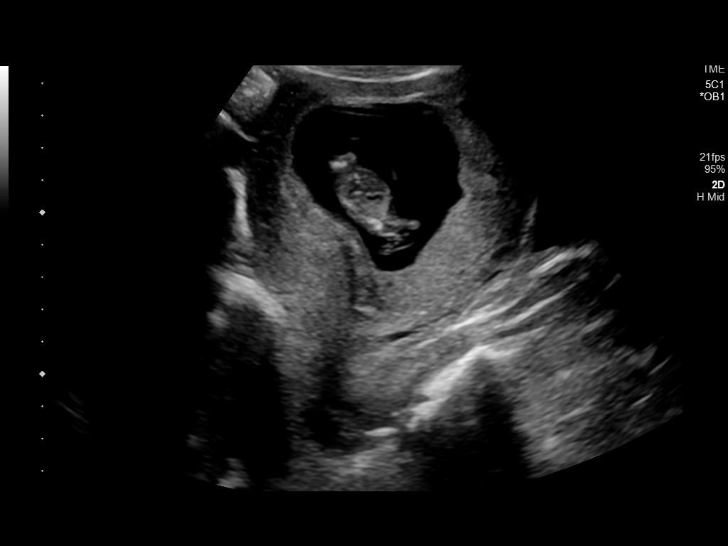
[im 24/44]
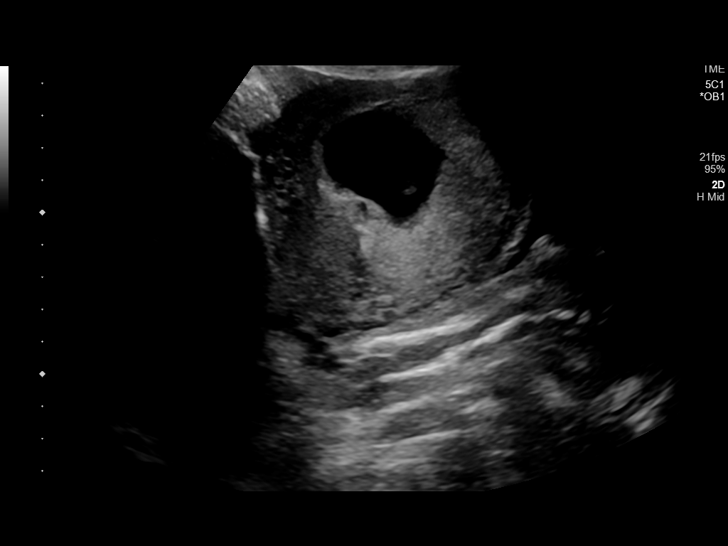
[im 28/44]
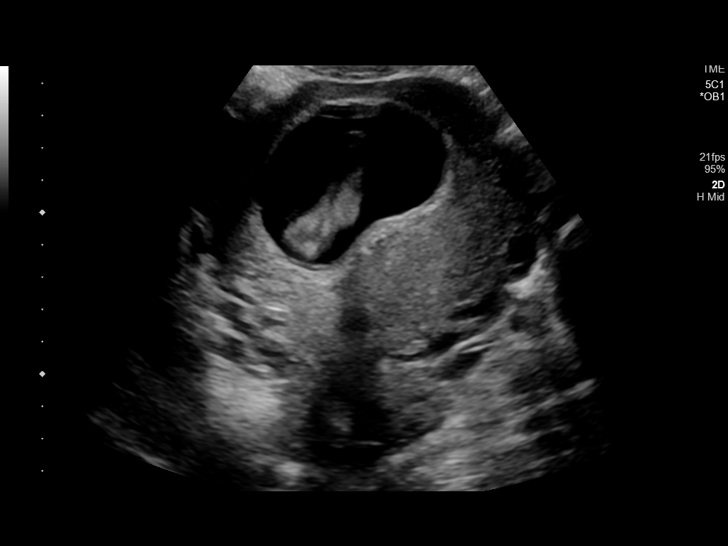
[im 31/44]
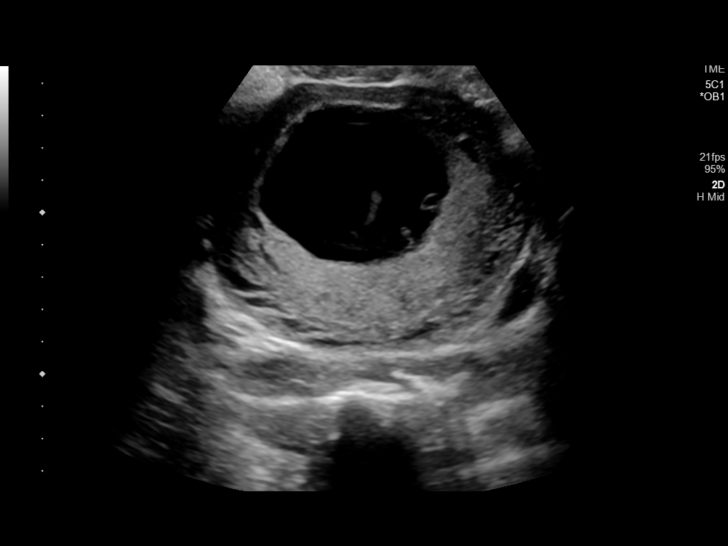
[im 34/44]
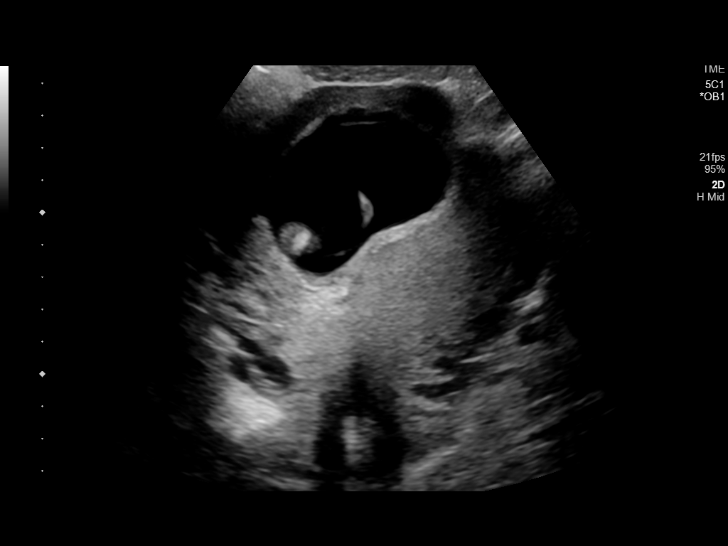
[im 37/44]
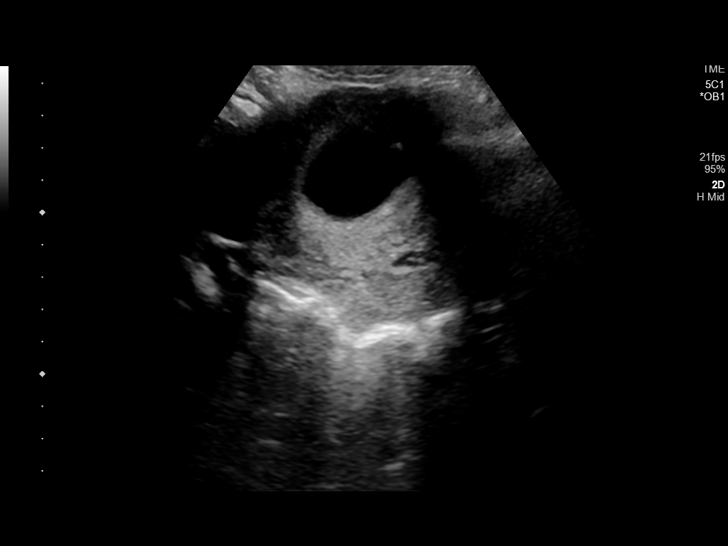
[im 40/44]
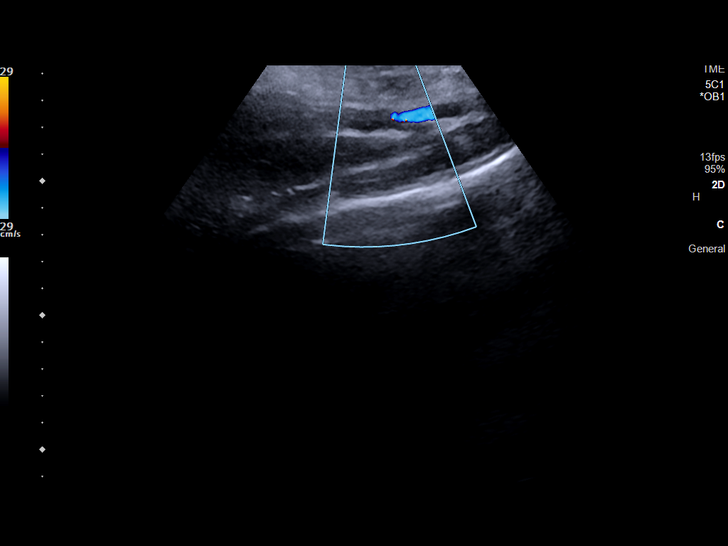
[im 44/44]
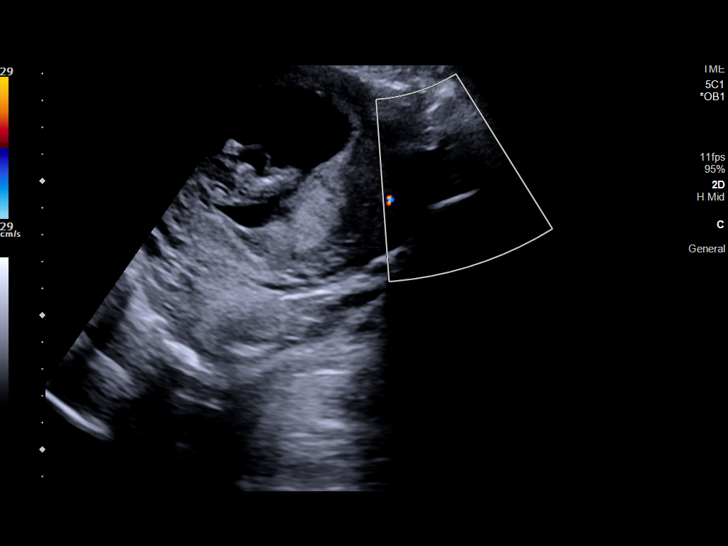

[14 of 28 positions shown; findings below may reference images not displayed]

FINDINGS: Intrauterine gestational sac: Single; visualized and normal in
shape.

Yolk sac:  Yes

Embryo:  Yes

Cardiac Activity: Yes

Heart Rate: 153 bpm

CRL: 5.3 cm   12 w 0 d                  US EDC: 09/01/2018

Subchorionic hemorrhage:  None visualized.

Maternal uterus/adnexae: The uterus is otherwise unremarkable in
appearance.

The ovaries are not visualized. No suspicious adnexal masses are
seen.

No free fluid is seen within the pelvic cul-de-sac.
IMPRESSION: Single live intrauterine pregnancy noted, with a crown-rump length
of 5.3 cm, corresponding to a gestational age of 12 weeks 0 days.
This reflects an estimated date of delivery September 01, 2018.

## 2020-02-24 ENCOUNTER — Emergency Department
Admission: EM | Admit: 2020-02-24 | Discharge: 2020-02-25 | Payer: Self-pay | Attending: Emergency Medicine | Admitting: Emergency Medicine

## 2020-02-24 ENCOUNTER — Other Ambulatory Visit: Payer: Self-pay

## 2020-02-24 DIAGNOSIS — F1110 Opioid abuse, uncomplicated: Secondary | ICD-10-CM | POA: Insufficient documentation

## 2020-02-24 DIAGNOSIS — F1410 Cocaine abuse, uncomplicated: Secondary | ICD-10-CM | POA: Insufficient documentation

## 2020-02-24 DIAGNOSIS — F17210 Nicotine dependence, cigarettes, uncomplicated: Secondary | ICD-10-CM | POA: Insufficient documentation

## 2020-02-24 DIAGNOSIS — F1210 Cannabis abuse, uncomplicated: Secondary | ICD-10-CM | POA: Insufficient documentation

## 2020-02-24 DIAGNOSIS — F1910 Other psychoactive substance abuse, uncomplicated: Secondary | ICD-10-CM | POA: Insufficient documentation

## 2020-02-24 NOTE — ED Triage Notes (Signed)
Pt not responding to voice. Will respond to sternal rub and ammonia inhalant. When asked if pt has drugs in her vagina, pt states "yes". When asked if we can take them out so the pt does not overdose, pt verbalizes "yes." LE with pt. Pt has been using drugs today.

## 2020-02-24 NOTE — ED Triage Notes (Signed)
Pt arrives via police escort for apparent overdose. Pt was stopped at a traffic light and police pulled her over. Pt reported to police that she stuck a bag of heroin up her vagina. Pt responds slowly to this RN when questions are asked. Pt not oriented but states she does not use drugs.

## 2020-02-24 NOTE — ED Notes (Signed)
Pt admits to using cocaine and heroin tonight. Pt reports putting bag of heroin inside her vaginal cavity, which she reports went too far inside her to be able to retrieve. Pt gives consent to retrieve at this time, MD at bedside  Specimen retrieved, given to [employer]

## 2020-02-24 NOTE — ED Provider Notes (Signed)
[facility] Emergency Department Provider Note  Time seen: 11:43 PM  I have reviewed the triage vital signs and the nursing notes.   HISTORY  Chief Complaint Drug Overdose   HPI Carolyn Cox is a 34 y.o. female with a past medical history of cardiomyopathy, polysubstance abuse, presents to the emergency department for medical evaluation.  According to police patient was stopped at a traffic stop, found to be intoxicated, but placed something vaginally.  Patient admits to using heroin and cocaine tonight.  Patient admits to placing heroin that was in a brown paper bag in her "stuff."  Patient has no other complaints.  Largely negative review of systems.  Patient has given verbal consent for [place] to perform a speculum exam and remove anything inside of her.   Past Medical History:  Diagnosis Date  . Acute systolic heart failure (HCC) 09/2017  . Chronic hepatitis C (HCC)   . Nonischemic cardiomyopathy (HCC) 09/2017   EF 25-30% EF  . Polysubstance abuse (HCC)   . Sciatica     Patient Active Problem List   Diagnosis Date Noted  . Trichomonas infection 10/17/2018  . Acute systolic heart failure (HCC)   . 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 Cocaine abuse (HCC) 09/19/2017  . 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 Elevated troponin 09/18/2017    Past Surgical History:  Procedure Laterality Date  . LEFT HEART CATH AND CORONARY ANGIOGRAPHY N/A 09/20/2017   Procedure: LEFT HEART CATH AND CORONARY ANGIOGRAPHY;  Surgeon: Iran Ouch, MD;  Location: [facility] INVASIVE CV LAB;  Service: Cardiovascular;  Laterality: N/A;  . MANDIBLE FRACTURE SURGERY      Prior to Admission medications   Medication Sig Start Date End Date Taking? Authorizing Provider  doxycycline (VIBRAMYCIN) 100 MG capsule Take 1 capsule (100 mg total) by mouth 2 (two) times daily. 04/25/19   Hall-Potvin, [place], PA-C    No Known Allergies  Family History  Problem Relation  Age of Onset  . Hypertension Mother   . Hypertension Maternal Grandmother   . Kidney disease Maternal Grandmother   . Kidney disease Maternal Aunt     Social History Social History   Tobacco Use  . Smoking status: Current Some Day Smoker    Types: Cigarettes  . Smokeless tobacco: Never Used  . Tobacco comment: less than 1 pack/wk.  Substance Use Topics  . Alcohol use: Not Currently  . Drug use: Yes    Types: Cocaine, Marijuana    Comment: +UDS for cocaine and MJ 07/27/18 Past use of amphetamines    Review of Systems Constitutional: Negative for fever. Cardiovascular: Negative for chest pain. Respiratory: Negative for shortness of breath. Gastrointestinal: Negative for abdominal pain Genitourinary: States she placed heroin vaginally. All other ROS negative  ____________________________________________   PHYSICAL EXAM:  VITAL SIGNS: ED Triage Vitals  Enc Vitals Group     BP 02/24/20 1751 (!) 129/101     Pulse Rate 02/24/20 1751 73     Resp 02/24/20 1751 18     Temp 02/24/20 1751 98.6 F (37 C)     Temp Source 02/24/20 1751 Oral     SpO2 02/24/20 1751 100 %     Weight 02/24/20 1752 130 lb (59 kg)     Height 02/24/20 1752 5\' 6"  (1.676 m)     Head Circumference --      Peak Flow --      Pain Score 02/24/20 1752 0     Pain Loc --  Pain Edu? --      Excl. in [place]? --    Constitutional: Alert and oriented. Well appearing and in no distress. Eyes: Normal exam ENT      Head: Normocephalic and atraumatic.      Mouth/Throat: Mucous membranes are moist. Cardiovascular: Normal rate, regular rhythm. Respiratory: Normal respiratory effort without tachypnea nor retractions. Breath sounds are clear  Gastrointestinal: Soft and nontender. No distention. Musculoskeletal: Nontender with normal range of motion in all extremities Neurologic:  Normal speech and language. No gross focal neurologic deficits  Skin:  Skin is warm, dry and intact.  Psychiatric: Mood and affect  are normal.   ____________________________________________    EKG  EKG viewed and interpreted by myself shows a sinus rhythm at 73 bpm with a narrow QRS, right axis deviation, largely normal intervals nonspecific ST changes.  ____________________________________________    INITIAL IMPRESSION / ASSESSMENT AND PLAN / ED COURSE  Pertinent labs & imaging results that were available during my care of the patient were reviewed by me and considered in my medical decision making (see chart for details).   Patient presents to the emergency department for medical evaluation.  Patient is currently in police custody and admits to placing heroin vaginally.  States he was wrapped in a brown paper bag.  Patient states she tried to get it out but thinks she pushed it up further.  Patient has given verbal consent for [place] to take it out.  Overall patient appears well, slightly slurred speech at times but does admit to heroin and cocaine use tonight.  Patient is awake alert and talking without issue.  Able to give a good history.  We will perform a pelvic examination.  Pelvic examination shows a small wadded up brown\paper, possibly with something inside.  This was removed with the speculum and given to police.  No other findings on pelvic exam.  No other complaints.  Patient will be discharged into police custody.  Carolyn Cox was evaluated in Emergency Department on 02/24/2020 for the symptoms described in the history of present illness. She was evaluated in the context of the global COVID-19 pandemic, which necessitated consideration that the patient might be at risk for infection with the SARS-CoV-2 virus that causes COVID-19. Institutional protocols and algorithms that pertain to the evaluation of patients at risk for COVID-19 are in a state of rapid change based on information released by regulatory bodies including the CDC and federal and state organizations. These policies and algorithms were followed  during the patient's care in the ED.  ____________________________________________   FINAL CLINICAL IMPRESSION(S) / ED DIAGNOSES  Medical evaluation   Harvest Dark, MD 02/24/20 [phone]

## 2021-02-10 ENCOUNTER — Ambulatory Visit (RURAL_HEALTH_CENTER): Payer: Self-pay | Admitting: Physician Assistant

## 2021-02-24 ENCOUNTER — Encounter (RURAL_HEALTH_CENTER): Payer: Self-pay | Admitting: Physician Assistant

## 2021-02-24 ENCOUNTER — Ambulatory Visit: Payer: BC Managed Care – PPO | Attending: Physician Assistant | Admitting: Physician Assistant

## 2021-02-24 ENCOUNTER — Other Ambulatory Visit
Admission: RE | Admit: 2021-02-24 | Discharge: 2021-02-24 | Disposition: A | Discharge status: Home / Self Care | Payer: BC Managed Care – PPO | Source: Ambulatory Visit | Attending: Physician Assistant | Admitting: Physician Assistant

## 2021-02-24 VITALS — BP 122/80 | HR 67 | Temp 98.0°F | Ht 68.0 in | Wt 341.4 lb

## 2021-02-24 DIAGNOSIS — G43109 Migraine with aura, not intractable, without status migrainosus: Secondary | ICD-10-CM

## 2021-02-24 DIAGNOSIS — Z0000 Encounter for general adult medical examination without abnormal findings: Secondary | ICD-10-CM

## 2021-02-24 DIAGNOSIS — G43909 Migraine, unspecified, not intractable, without status migrainosus: Secondary | ICD-10-CM | POA: Insufficient documentation

## 2021-02-24 DIAGNOSIS — Z803 Family history of malignant neoplasm of breast: Secondary | ICD-10-CM

## 2021-02-24 LAB — COMPREHENSIVE METABOLIC PANEL
ALT: 14 U/L (ref 0–55)
AST (SGOT): 12 U/L (ref 10–42)
Albumin/Globulin Ratio: 1.24 Ratio (ref 0.80–2.00)
Albumin: 4.1 gm/dL (ref 3.5–5.0)
Alkaline Phosphatase: 52 U/L (ref 40–145)
Anion Gap: 15.1 mMol/L (ref 7.0–18.0)
BUN / Creatinine Ratio: 16.2 Ratio (ref 10.0–30.0)
BUN: 12 mg/dL (ref 7–22)
Bilirubin, Total: 0.3 mg/dL (ref 0.1–1.2)
CO2: 27 mMol/L (ref 20–30)
Calcium: 9.3 mg/dL (ref 8.5–10.5)
Chloride: 103 mMol/L (ref 98–110)
Creatinine: 0.74 mg/dL (ref 0.60–1.20)
EGFR: 110 mL/min/{1.73_m2} (ref 60–150)
Globulin: 3.3 gm/dL (ref 2.0–4.0)
Glucose: 91 mg/dL (ref 71–99)
Osmolality Calculated: 281 mOsm/kg (ref 275–300)
Potassium: 4.1 mMol/L (ref 3.5–5.3)
Protein, Total: 7.4 gm/dL (ref 6.0–8.3)
Sodium: 141 mMol/L (ref 136–147)

## 2021-02-24 LAB — CBC AND DIFFERENTIAL
Basophils %: 0.9 % (ref 0.0–3.0)
Basophils Absolute: 0.1 10*3/uL (ref 0.0–0.3)
Eosinophils %: 3.2 % (ref 0.0–7.0)
Eosinophils Absolute: 0.2 10*3/uL (ref 0.0–0.8)
Hematocrit: 39.7 % (ref 36.0–48.0)
Hemoglobin: 12.8 gm/dL (ref 12.0–16.0)
Lymphocytes Absolute: 2.3 10*3/uL (ref 0.6–5.1)
Lymphocytes: 31.1 % (ref 15.0–46.0)
MCH: 30 pg (ref 28–35)
MCHC: 32 gm/dL (ref 32–36)
MCV: 93 fL (ref 80–100)
MPV: 7.9 fL (ref 6.0–10.0)
Monocytes Absolute: 0.4 10*3/uL (ref 0.1–1.7)
Monocytes: 5.8 % (ref 3.0–15.0)
Neutrophils %: 59.1 % (ref 42.0–78.0)
Neutrophils Absolute: 4.3 10*3/uL (ref 1.7–8.6)
PLT CT: 280 10*3/uL (ref 130–440)
RBC: 4.29 10*6/uL (ref 3.80–5.00)
RDW: 11.9 % (ref 11.0–14.0)
WBC: 7.3 10*3/uL (ref 4.0–11.0)

## 2021-02-24 LAB — LIPID PANEL
Cholesterol: 165 mg/dL (ref 75–199)
Coronary Heart Disease Risk: 4.34
HDL: 38 mg/dL — ABNORMAL LOW (ref 45–65)
LDL Calculated: 103 mg/dL
Triglycerides: 118 mg/dL (ref 10–150)
VLDL: 24 (ref 0–40)

## 2021-02-24 LAB — THYROID STIMULATING HORMONE (TSH), REFLEX ON ABNORMAL TO FREE T4, SERUM: TSH: 3.41 u[IU]/mL (ref 0.40–4.20)

## 2021-02-24 MED ORDER — TOPIRAMATE 25 MG PO TABS
25.0000 mg | ORAL_TABLET | Freq: Two times a day (BID) | ORAL | 0 refills | Status: DC
Start: 2021-02-24 — End: 2021-03-23

## 2021-02-24 MED ORDER — NURTEC 75 MG PO TBDP
75.00 mg | ORAL_TABLET | ORAL | 2 refills | Status: AC | PRN
Start: 2021-02-24 — End: ?

## 2021-02-24 NOTE — Progress Notes (Signed)
[facility]  [address] [place], [address]  [phone]         New Patient Office Note    Date Time: 02/24/2021 9:47 AM  Patient Name: Debra Gomez, Debra Gomez  DOB: 03/26/1986                                                                Subjective   For the duration of this visit, I wore appropriate PPE to include loop surgical face mask over the nose and mouth and VH provided goggles, and the patient wore appropriate PPE to include cloth face mask over the nose and mouth.     History was provided by the  patient    HPI: Patient is a 35 y.o. female who presents today to establish care.        Questions/Concerns today:   1. Frequent Headaches  4-5 times per week  Behind the eyes or unilateral on the back of the head.   Pt has a specific smell she smells before they come  Sometimes sensitive to light and sound  Occasional aura   Does have nausea, no vomiting  No vision changes associated   topamax at age 66, quit because she thought she was better. topamax worked well.   From then until now uses ibuprofen prn  December 2019 pt was in the ED for them  Pt had imitrex when it was new and pt had a reaction where she couldn't breathe, became tachycardic.     Family H/o Breast Cancer  Mom had breast CA at age ~39. She also had a recurrence and is currently in remission doing well. Pt unsure if mom was ever tested for genetic markers but pt has never been tested. She has never had a mammogram. She does routine self checks at home. She has no breast concerns today.        History:  Past Medical History:   Diagnosis Date   . Migraines    . Reactive airway disease      History reviewed. No pertinent surgical history.  Family History   Problem Relation Age of Onset   . Diabetes Mother    . Breast cancer Mother 39   . No known problems Father    . Heart disease Maternal Grandmother    . Heart  disease Maternal Grandfather    . No known problems Paternal Grandmother    . No known problems Paternal Grandfather      Social History     Social History Narrative   . Not on file     Allergies   Allergen Reactions   . Imitrex [Sumatriptan] Shortness Of Breath and Tachycardia   . Sulfa Antibiotics      No outpatient medications have been marked as taking for the 02/24/21 encounter (Office Visit) with Verdene Rio, PA.       Review of Systems   Constitutional: Negative for chills, fatigue and fever.   Respiratory: Negative for cough, chest tightness and shortness of breath.    Cardiovascular: Negative for chest pain and leg swelling.   Gastrointestinal: Negative for abdominal pain, diarrhea, nausea and vomiting.   Genitourinary: Negative for dysuria, frequency and hematuria.   Musculoskeletal: Negative for arthralgias, gait problem  and myalgias.   Skin: Negative for rash and wound.   Neurological: Positive for headaches. Negative for dizziness and light-headedness.                                                                   Objective   BP 122/80   Pulse 67   Temp 98 F (36.7 C) (Tympanic)   Ht 1.727 m (5\' 8" )   Wt 154.9 kg (341 lb 6.4 oz)   LMP 02/07/2021   SpO2 98%   BMI 51.91 kg/m     Physical Exam  Vitals and nursing note reviewed.   Constitutional:       General: She is not in acute distress.     Appearance: Normal appearance. She is normal weight. She is not ill-appearing.   HENT:      Head: Normocephalic and atraumatic.   Eyes:      General: Lids are normal. Vision grossly intact. No scleral icterus.     Extraocular Movements: Extraocular movements intact.      Right eye: Normal extraocular motion and no nystagmus.      Left eye: Normal extraocular motion and no nystagmus.      Conjunctiva/sclera: Conjunctivae normal.      Pupils: Pupils are equal, round, and reactive to light.   Cardiovascular:      Rate and Rhythm: Normal rate and regular rhythm.      Heart sounds: Normal heart sounds. No  murmur heard.    No friction rub. No gallop.   Pulmonary:      Effort: Pulmonary effort is normal. No respiratory distress.      Breath sounds: Normal breath sounds. No wheezing, rhonchi or rales.   Abdominal:      General: There is no distension.      Palpations: Abdomen is soft.      Tenderness: There is no abdominal tenderness. There is no right CVA tenderness, left CVA tenderness or guarding.   Musculoskeletal:      Cervical back: Normal range of motion.   Skin:     General: Skin is warm and dry.   Neurological:      General: No focal deficit present.      Mental Status: She is alert and oriented to person, place, and time. Mental status is at baseline.   Psychiatric:         Mood and Affect: Mood normal.         Behavior: Behavior normal.         Thought Content: Thought content normal.         Judgment: Judgment normal.         Labs:  Lab Results   Component Value Date    NA 138 09/23/2018    K 3.7 09/23/2018    BUN 12 09/23/2018    GLU 124 (H) 09/23/2018    CA 8.9 09/23/2018    AST 15 09/19/2018    ALT 19 09/19/2018    ALKPHOS 59 09/19/2018       Immunizations:    There is no immunization history on file for this patient.  Assessment and Plan:   1. Migraine with aura and without status migrainosus, not intractable  - topiramate (TOPAMAX) 25 MG tablet; Take 1 tablet (25 mg total) by mouth 2 (two) times daily  Dispense: 60 tablet; Refill: 0  - Rimegepant Sulfate (Nurtec) 75 MG Tablet Dispersible; Take 1 tablet (75 mg total) by mouth as needed (at earliest onset of migraine)  Dispense: 8 tablet; Refill: 2    Chronic, uncontrolled  Pt has used topamax previously which worked well but was stopped bc she was better, around age 89.   She has tried imitrex, had immediate SOB and tachycardia following taking the medication, pt considered allergic and triptan class should be avoided  Will restart topamax for prophylaxis and trial nurtec for abortive  therapy  Discussed possible side effects and risk vs. Benefit, all questions were answered  F/u in 2 weeks, will adjust topamax at that time, increase to 50mg  if tolerating well.    2. Family history of breast cancer  - Ambulatory referral to Breast Clinic   - GAIL model starts at age 75, with first degree relative history of breast CA in mother at age 40, pt likely will need sooner screenings and states she would want it treated if found. I recommended consult at the breast center and pt is nervous, but agreeable.     3. Annual physical exam  - CBC and differential; Future  - Comprehensive metabolic panel; Future  - Lipid panel; Future  - TSH, Abn Reflex to Free T4, Serum; Future    Labs ordered for annual which will be completed at next visit in 2 weeks.        Return in about 2 weeks (around 03/10/2021) for annual and migraine follow up.       Candie Echevaria, PA-C 02/24/2021 9:47 AM           This note was completed using Dragon medical speech recognition software. Grammatical errors, random word insertions, pronoun errors, incorrect word insertion, misspellings and incomplete sentences are occasional consequences of this technology due to software limitations. If there are questions or concerns about the content of this note, or information contained within the body of this dictation, they should be addressed with the provider for clarification.

## 2021-03-10 ENCOUNTER — Encounter (RURAL_HEALTH_CENTER): Payer: Self-pay | Admitting: Physician Assistant

## 2021-03-10 ENCOUNTER — Ambulatory Visit: Payer: BC Managed Care – PPO | Attending: Physician Assistant | Admitting: Physician Assistant

## 2021-03-10 VITALS — BP 116/88 | HR 69 | Temp 99.3°F | Ht 67.0 in | Wt 337.6 lb

## 2021-03-10 DIAGNOSIS — G43909 Migraine, unspecified, not intractable, without status migrainosus: Secondary | ICD-10-CM

## 2021-03-10 DIAGNOSIS — Z23 Encounter for immunization: Secondary | ICD-10-CM

## 2021-03-10 DIAGNOSIS — Z0000 Encounter for general adult medical examination without abnormal findings: Secondary | ICD-10-CM

## 2021-03-10 NOTE — Progress Notes (Signed)
[facility]  [address] [place], [address]  [phone]       Comprehensive Physical Exam      Date Time: 03/10/2021 3:41 PM  Patient Name: Debra Gomez, Debra Gomez  DOB: 1985/10/20                                                     Subjective   Zorah Backes is a 35 y.o. female who presents today for a routine physical.       For the duration of this visit, I wore appropriate PPE to include loop surgical face mask over the nose and mouth and VH provided goggles, and the patient wore appropriate PPE to include cloth face mask over the nose and mouth.     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     Pt also here to follow up on migraines. Last visit we started nurtec prn and topamax for prevention. Pt now on 50mg  topamax nightly, did great on 25 mg but when she increased to 50 mg she has had significant somnolence.  She states she is still able to function and she is only gone the last 3 days without a nap.  Patient was previously having migraine headaches 4-5 times per week, she states she has only had 2 significant migraines since last appointment and she did use Nurtec which worked very well without side effects.  She states it worked quickly.        PMH:  Past Medical History:   Diagnosis Date   . Migraines    . Reactive airway disease        Surgical History:  History reviewed. No pertinent surgical history.    Family History:  Family History   Problem Relation Age of Onset   . Diabetes Mother    . Breast cancer Mother 26   . No known problems Father     . Heart disease Maternal Grandmother    . Heart disease Maternal Grandfather    . No known problems Paternal Grandmother    . No known problems Paternal Grandfather        Social History:  Social History     Tobacco Use   . Smoking status: Former Smoker     Packs/day: 0.50     Years: 15.00     Pack years: 7.50     Quit date: 11/29/2020     Years since quitting: 0.2   . Smokeless tobacco: Never Used   Vaping Use   . Vaping Use: Every day   . Start date: 11/29/2020   Substance Use Topics   . Alcohol use: Yes     Comment: Rare   . Drug use: Never       Allergies:  Allergies   Allergen Reactions   . Imitrex [Sumatriptan] Shortness Of Breath and Tachycardia   . Sulfa Antibiotics        Medications:  Current/Home  Medications    RIMEGEPANT SULFATE (NURTEC) 75 MG TABLET DISPERSIBLE    Take 1 tablet (75 mg total) by mouth as needed (at earliest onset of migraine)    TOPIRAMATE (TOPAMAX) 25 MG TABLET    Take 1 tablet (25 mg total) by mouth 2 (two) times daily           ROS:  Review of Systems   Constitutional: Negative for chills, fever and unexpected weight change.   HENT: Negative for congestion, ear pain, hearing loss, sore throat and trouble swallowing.    Eyes: Negative for pain, redness and visual disturbance.   Respiratory: Negative for cough, chest tightness and shortness of breath.    Cardiovascular: Negative for chest pain, palpitations and leg swelling.   Gastrointestinal: Negative for abdominal pain, blood in stool, constipation, diarrhea, nausea and vomiting.   Endocrine: Negative for polydipsia and polyuria.   Genitourinary: Negative for dysuria, hematuria, menstrual problem and pelvic pain.   Musculoskeletal: Negative for myalgias.   Skin: Negative for rash.   Allergic/Immunologic: Negative for environmental allergies.   Neurological: Positive for headaches (chronic migraines, improving). Negative for dizziness and numbness.   Psychiatric/Behavioral: Negative for confusion and dysphoric mood. The patient is not  nervous/anxious.                                                        Objective     Vitals:    03/10/21 1434   BP: 116/88   Pulse: 69   Temp: 99.3 F (37.4 C)   TempSrc: Tympanic   SpO2: 99%   Weight: 153.1 kg (337 lb 9.6 oz)   Height: 1.702 m (5\' 7" )     Body mass index is 52.88 kg/m.    Physical Exam:  Physical Exam  Vitals and nursing note reviewed.   Constitutional:       General: She is not in acute distress.     Appearance: Normal appearance. She is not ill-appearing or toxic-appearing.   HENT:      Head: Normocephalic and atraumatic.      Right Ear: Tympanic membrane, ear canal and external ear normal. There is no impacted cerumen.      Left Ear: Tympanic membrane, ear canal and external ear normal. There is no impacted cerumen.      Nose: Nose normal.      Mouth/Throat:      Mouth: Mucous membranes are moist.      Pharynx: No oropharyngeal exudate or posterior oropharyngeal erythema.   Eyes:      General: No scleral icterus.     Extraocular Movements: Extraocular movements intact.      Conjunctiva/sclera: Conjunctivae normal.      Pupils: Pupils are equal, round, and reactive to light.   Neck:      Thyroid: No thyromegaly or thyroid tenderness.      Vascular: No carotid bruit.   Cardiovascular:      Rate and Rhythm: Normal rate and regular rhythm.      Pulses: Normal pulses.      Heart sounds: Normal heart sounds. No murmur heard.    No friction rub. No gallop.   Pulmonary:      Effort: Pulmonary effort is normal. No respiratory distress.      Breath sounds: Normal breath sounds. No wheezing, rhonchi or rales.  Abdominal:      General: There is no distension.      Palpations: Abdomen is soft.      Tenderness: There is no abdominal tenderness. There is no guarding or rebound.   Musculoskeletal:      Cervical back: Normal range of motion.      Right lower leg: No edema.      Left lower leg: No edema.   Lymphadenopathy:      Cervical: No cervical adenopathy.   Skin:     General: Skin is warm and dry.       Findings: No rash.   Neurological:      General: No focal deficit present.      Mental Status: She is alert. Mental status is at baseline.   Psychiatric:         Mood and Affect: Mood normal.         Behavior: Behavior normal.         Thought Content: Thought content normal.         Judgment: Judgment normal.         Labs:  No visits with results within 2 Week(s) from this visit.   Latest known visit with results is:   Hospital Outpatient Visit on 02/24/2021   Component Date Value Ref Range Status   . WBC 02/24/2021 7.3  4.0 - 11.0 K/cmm Final   . RBC 02/24/2021 4.29  3.80 - 5.00 M/cmm Final   . Hemoglobin 02/24/2021 12.8  12.0 - 16.0 gm/dL Final   . Hematocrit 13/05/6577 39.7  36.0 - 48.0 % Final   . MCV 02/24/2021 93  80 - 100 fL Final   . MCH 02/24/2021 30  28 - 35 pg Final   . MCHC 02/24/2021 32  32 - 36 gm/dL Final   . RDW 46/96/2952 11.9  11.0 - 14.0 % Final   . PLT CT 02/24/2021 280  130 - 440 K/cmm Final   . MPV 02/24/2021 7.9  6.0 - 10.0 fL Final   . Neutrophils % 02/24/2021 59.1  42.0 - 78.0 % Final   . Lymphocytes 02/24/2021 31.1  15.0 - 46.0 % Final   . Monocytes 02/24/2021 5.8  3.0 - 15.0 % Final   . Eosinophils % 02/24/2021 3.2  0.0 - 7.0 % Final   . Basophils % 02/24/2021 0.9  0.0 - 3.0 % Final   . Neutrophils Absolute 02/24/2021 4.3  1.7 - 8.6 K/cmm Final   . Lymphocytes Absolute 02/24/2021 2.3  0.6 - 5.1 K/cmm Final   . Monocytes Absolute 02/24/2021 0.4  0.1 - 1.7 K/cmm Final   . Eosinophils Absolute 02/24/2021 0.2  0.0 - 0.8 K/cmm Final   . Basophils Absolute 02/24/2021 0.1  0.0 - 0.3 K/cmm Final    Comment: The above 20 analytes were performed by [facility] [phone])  [address],[place] [address]     . Sodium 02/24/2021 141  136 - 147 mMol/L Final   . Potassium 02/24/2021 4.1  3.5 - 5.3 mMol/L Final   . Chloride 02/24/2021 103  98 - 110 mMol/L Final   . CO2 02/24/2021 27  20 - 30 mMol/L Final   . Calcium 02/24/2021 9.3  8.5 - 10.5 mg/dL Final   . Glucose 02/72/5366  91  71 - 99 mg/dL Final   . Creatinine 44/12/4740 0.74  0.60 - 1.20 mg/dL Final   . BUN 59/56/3875 12  7 - 22 mg/dL Final   .  Protein, Total 02/24/2021 7.4  6.0 - 8.3 gm/dL Final   . Albumin 16/07/9603 4.1  3.5 - 5.0 gm/dL Final   . Alkaline Phosphatase 02/24/2021 52  40 - 145 U/L Final   . ALT 02/24/2021 14  0 - 55 U/L Final   . AST (SGOT) 02/24/2021 12  10 - 42 U/L Final   . Bilirubin, Total 02/24/2021 0.3  0.1 - 1.2 mg/dL Final   . Albumin/Globulin Ratio 02/24/2021 1.24  0.80 - 2.00 Ratio Final   . Anion Gap 02/24/2021 15.1  7.0 - 18.0 mMol/L Final   . BUN / Creatinine Ratio 02/24/2021 16.2  10.0 - 30.0 Ratio Final   . EGFR 02/24/2021 110  60 - 150 mL/min/1.47m2 Final     eGFR determined using the CKD-EPI Creatinine equation(2021)   . Osmolality Calculated 02/24/2021 281  275 - 300 mOsm/kg Final   . Globulin 02/24/2021 3.3  2.0 - 4.0 gm/dL Final    Comment: The above 20 analytes were performed by [facility] [phone])  [address],[place] [address]     . Cholesterol 02/24/2021 165  75 - 199 mg/dL Final   . Triglycerides 02/24/2021 118  10 - 150 mg/dL Final   . HDL 47/82/9562 38 (A) 45 - 65 mg/dL Final   . LDL Calculated 02/24/2021 130  mg/dL Final   . Coronary Heart Disease Risk 02/24/2021 4.34   Final   . VLDL 02/24/2021 24  0 - 40 Final    Comment: Lipid Panel Interpretive Comment:  *Triglycerides >400 mg/dL Invalidates LDL calculation.  *Normal Values for HDL valid only for patients over 44 years of age   HDL levels less than 40 mg/dL are a positive risk factor for Coronary Heart Disease.   HDL levels over 60 mg/dL are a negative risk factor for Coronary Heart Disease  *LDL Interpretation     Optimal                          <100 mg/dL     Near Optimal/Above Optimal  100 - 129 mg/dL     Borderline High             130 - 159 mg/dL     High                        160 - 189 mg/dL     Very High                        >189 mg/dL  *Coronary Heart Disease Risk:    (Chol/HDL)     Female        Female     1/2 Average   3.43        3.27     Average       4.94        4.44     2X Average    9.55        7.05     3X Average   23.99       11.04  The above 6 analytes were performed by [facility] [phone])  [address],[place] [address]     . T4 Free 02/24/2021 Not Indicated  0.70 - 1.48 ng/dL Final    Furosemide, at the daily therapeutic level, may cause elevated free T4 results.  Free T4 testing should not be used  to determine Free T4 levels in patients treated with lipid lowering agents containing D-T4.  Autoantibodies to thyroid hormones can interfere with this assay.   Marland Kitchen TSH 02/24/2021 3.41  0.40 - 4.20 uIU/mL Final    Comment: TSH results can be affected in those patients who have been treated with mouse monoclonal antibodies and those patients that have heterophile antibodies present. Clinical correlation is recommended.  The above 2 analytes were performed by [facility] [phone])  [address] [address]         Rads:  Radiological Procedure reviewed. No results found.                                           Assessment & Plan   1. Annual physical exam  Health maintenance updated today  Labs were  completed prior to visit today and were reviewed with the patient.     2. Immunization due  - Tdap vaccine greater than or equal to 7yo IM    3. Migraine without status migrainosus, not intractable, unspecified migraine type  Chronic, uncontrolled but improving   Currently on topamax 50mg  for prevention and nurtec prn for abortive therapy for management.   Pt has been having side effect of somnolence at 50mg , will keep her at that dose for now, may need to increase later  Decreased from 4-5 migraines per week to only 2 since last visit.  Continue current regimen as prescribed.     Return in about 1 month (around 04/09/2021) for migraine f/u .      Candie Echevaria, PA-C 03/10/2021 3:41 PM         This note was completed using Dragon medical speech  recognition software. Grammatical errors, random word insertions, pronoun errors, incorrect word insertion, misspellings and incomplete sentences are occasional consequences of this technology due to software limitations. If there are questions or concerns about the content of this note, or information contained within the body of this dictation, they should be addressed with the provider for clarification.

## 2021-03-16 ENCOUNTER — Ambulatory Visit (INDEPENDENT_AMBULATORY_CARE_PROVIDER_SITE_OTHER): Payer: BC Managed Care – PPO | Admitting: Surgery

## 2021-03-16 ENCOUNTER — Encounter (INDEPENDENT_AMBULATORY_CARE_PROVIDER_SITE_OTHER): Payer: Self-pay | Admitting: Surgery

## 2021-03-16 ENCOUNTER — Encounter (INDEPENDENT_AMBULATORY_CARE_PROVIDER_SITE_OTHER): Payer: BC Managed Care – PPO | Admitting: Surgery

## 2021-03-16 VITALS — HR 81 | Resp 16 | Ht 67.0 in | Wt 337.2 lb

## 2021-03-16 DIAGNOSIS — Z1231 Encounter for screening mammogram for malignant neoplasm of breast: Secondary | ICD-10-CM

## 2021-03-16 DIAGNOSIS — Z9189 Other specified personal risk factors, not elsewhere classified: Secondary | ICD-10-CM

## 2021-03-16 DIAGNOSIS — Z803 Family history of malignant neoplasm of breast: Secondary | ICD-10-CM

## 2021-03-16 NOTE — Progress Notes (Signed)
Debra Gomez  21-Dec-1985      New Patient Evaluation    HISTORY OF PRESENT ILLNESS: Debra Gomez is a 35 y.o. female here for evaluation of family history of breast cancer in her mother at the age of 8 and again at the age of 22.  She does not know if the second occurrence of breast cancer was an in breast recurrence, a contralateral breast cancer, or distant metastasis.  She does know that her mother had double mastectomy at the second breast cancer diagnosis, at the age of 49.  She does not know if her mother had genetic testing or not.      She does examine her breast routinely and has no incidental concerns.  She had menarche at age 43, is nulliparous and is premenopausal.  She has no prior history of breast pathology.    PAST MEDICAL HISTORY:  Allergies:   Allergies   Allergen Reactions   . Imitrex [Sumatriptan] Shortness Of Breath and Tachycardia   . Sulfa Antibiotics      Medicines:   Current Outpatient Medications   Medication Sig Dispense Refill   . Rimegepant Sulfate (Nurtec) 75 MG Tablet Dispersible Take 1 tablet (75 mg total) by mouth as needed (at earliest onset of migraine) 8 tablet 2   . topiramate (TOPAMAX) 25 MG tablet Take 1 tablet (25 mg total) by mouth 2 (two) times daily 60 tablet 0     No current facility-administered medications for this visit.     Medical illnesses:   Past Medical History:   Diagnosis Date   . Migraines    . Reactive airway disease      1. Past surgical history: History reviewed. No pertinent surgical history.    SOCIAL HISTORY:   Social History     Tobacco Use   . Smoking status: Former Smoker     Packs/day: 0.50     Years: 15.00     Pack years: 7.50     Quit date: 11/29/2020     Years since quitting: 0.2   . Smokeless tobacco: Never Used   Substance Use Topics   . Alcohol use: Yes     Comment: Rare       FAMILY HISTORY:   Family History   Problem Relation Age of Onset   . Diabetes Mother    . Breast cancer Mother 29        again at 60   . No known problems Father    .  Heart disease Maternal Grandmother    . Heart disease Maternal Grandfather    . No known problems Paternal Grandmother    . No known problems Paternal Grandfather        EXAMINATION:  CONSTITUTIONAL: General appearance normal     VITALS: Pulse 81, resp. rate 16, height 1.702 m (5\' 7" ), weight 153 kg (337 lb 3.2 oz), last menstrual period 03/14/2021.  BREAST: There is no supraclavicular or axillary adenopathy bilaterally.  There is significant adipose tissue in the anterior aspect of each axilla.  The chest is normal.  The right breast has no nipple drainage, skin change or mass. The left breast has no nipple drainage, skin change or mass.  This is a nonworrisome exam.     IMAGING: She has not had any breast imaging.    CORRESPONDENCE: Referral received 02/24/2021 from the office of Candie Echevaria, [place].  Office notes reviewed in Epic.    IMPRESSION:   1. At risk for developing breast cancer  2. Family history of breast cancer (mother, age 52)  3. Possible genetic mutation in this patient's mother, with breast cancer at age 44  4. Obesity      PLAN:   1. We reviewed that her breast exam is normal.  Technique reviewed and discussed.  2. We reviewed that the most appropriate person to have genetic testing would be this patient's mother, diagnosed with breast cancer at the age of 74.  Her mother is alive and well.  The likelihood that her mother has a genetic mutation is approximately 10%.  If this patient's mother has a deleterious genetic mutation, then the patient should be tested for the same genetic mutation (i.e. cascade testing).    3. If this patient were found to have a deleterious genetic mutation predisposing to breast cancer, a targeted office visit would be scheduled for management options.  4. If this patient's mother has a deleterious genetic mutation which caused her breast cancer, and this patient does not have that same mutation, then this patient's risk of breast cancer would be normal.  5. Testing this  patient without knowledge of the mother's genetic status is much less helpful, but would be done if the patient's mother declines testing.  6. In families with breast cancer not felt to be related to genetic mutations, often risk models are used to determine risk of breast cancer, but typically are not felt to apply in patients this young.  7. At minimum, I would advise baseline screening 3D mammograms as well as screening ultrasounds.  Order slips given and she will call us if she does not hear from [place] with results.  8. In the interim, the patient will encourage her mother to have genetic testing and will advise [place] of results.  Based on that information, we will decide whether or not the patient requires genetic counseling and testing as well as whether or not she needs to follow-up in our high-risk clinic.  9. She will return here as needed for breast concerns in the interim.  10. We did review the need for weight loss, for her overall health but also to decrease her risk of breast cancer.         Alberteen Spindle, MD, [facility]   [facility]  [phone]     CC: Candie Echevaria, [place]

## 2021-03-22 ENCOUNTER — Encounter (RURAL_HEALTH_CENTER): Payer: Self-pay | Admitting: Physician Assistant

## 2021-03-23 ENCOUNTER — Other Ambulatory Visit (RURAL_HEALTH_CENTER): Payer: Self-pay | Admitting: Physician Assistant

## 2021-03-23 DIAGNOSIS — G43109 Migraine with aura, not intractable, without status migrainosus: Secondary | ICD-10-CM

## 2021-04-16 ENCOUNTER — Other Ambulatory Visit (RURAL_HEALTH_CENTER): Payer: Self-pay | Admitting: Physician Assistant

## 2021-04-16 DIAGNOSIS — G43109 Migraine with aura, not intractable, without status migrainosus: Secondary | ICD-10-CM

## 2021-04-17 ENCOUNTER — Encounter (RURAL_HEALTH_CENTER): Payer: Self-pay | Admitting: Physician Assistant

## 2021-04-17 ENCOUNTER — Ambulatory Visit: Payer: BC Managed Care – PPO | Attending: Physician Assistant | Admitting: Physician Assistant

## 2021-04-17 VITALS — BP 116/82 | HR 81 | Temp 98.8°F | Wt 335.4 lb

## 2021-04-17 DIAGNOSIS — Z803 Family history of malignant neoplasm of breast: Secondary | ICD-10-CM

## 2021-04-17 DIAGNOSIS — G43909 Migraine, unspecified, not intractable, without status migrainosus: Secondary | ICD-10-CM

## 2021-04-17 MED ORDER — TOPIRAMATE 100 MG PO TABS
100.0000 mg | ORAL_TABLET | Freq: Every day | ORAL | 5 refills | Status: DC
Start: 2021-04-17 — End: 2021-10-02

## 2021-04-17 NOTE — Progress Notes (Signed)
[facility]  [address] [place], [address]  [phone]         Office Note    Date Time: 05/23/2021 12:44 PM  Patient Name: Debra Gomez, Debra Gomez  DOB: 23-Feb-1986                                                   Subjective   For the duration of this visit, I wore appropriate PPE to include loop surgical face mask over the nose and mouth and VH provided goggles, and the patient wore appropriate PPE to include cloth face mask over the nose and mouth.     HPI:  This a 35 y.o. female here today for    Chief Complaint   Patient presents with    Migraine     F/u       Pt here to follow up on migraines. Pt now on 75mg  topamax nightly, did have some somnolence upon increasing dosage but this lasted 7-10 days then resolved. Patient was originally having migraine headaches 4-5 times per week, she states she his down to 1-3 per week, usually on the weekend triggered by not eating often enough. 1 during the work week. Nurtec works prn for migraines, has not needed  recently. She has been having some milder headaches that feel like sinus but also don't feel like sinus at the same time per patient. Not the same as her migraines. She is doing well with hydration, drinks 1-2 gallons per day of water.  No new features of migraines.    Pt has strong family history of breast CA and was referred to breast center. She has seen them and I reviewed that note today, we discussed this together.     History:  Past Medical History:   Diagnosis Date    Migraines     Reactive airway disease        History reviewed. No pertinent surgical history.    Social History     Socioeconomic History    Marital status: Married   Tobacco Use    Smoking status: Former     Packs/day: 0.50     Years: 15.00     Pack years: 7.50     Types: Cigarettes     Quit date: 11/29/2020     Years since quitting: 0.4    Smokeless tobacco:  Never   Vaping Use    Vaping Use: Every day    Start date: 11/29/2020   Substance and Sexual Activity    Alcohol use: Yes     Comment: Rare    Drug use: Never    Sexual activity: Yes     Partners: Male         Allergies   Allergen Reactions    Imitrex [Sumatriptan] Shortness Of Breath and Tachycardia    Sulfa Antibiotics                                                                      Objective   BP 116/82   Pulse 81   Temp 98.8 F (  37.1 C) (Tympanic)   Wt 152.1 kg (335 lb 6.4 oz)   LMP 04/13/2021 (Approximate)   SpO2 98%   BMI 52.53 kg/m  Body mass index is 52.53 kg/m.    Physical Exam:  Physical Exam  Vitals and nursing note reviewed.   Constitutional:       General: She is not in acute distress.     Appearance: Normal appearance. She is not ill-appearing or toxic-appearing.   HENT:      Head: Normocephalic and atraumatic.   Eyes:      General: Lids are normal.      Extraocular Movements:      Right eye: Normal extraocular motion and no nystagmus.      Left eye: Normal extraocular motion and no nystagmus.      Conjunctiva/sclera: Conjunctivae normal.      Pupils: Pupils are equal, round, and reactive to light.   Cardiovascular:      Rate and Rhythm: Normal rate and regular rhythm.      Heart sounds: No murmur heard.    No gallop.   Pulmonary:      Effort: Pulmonary effort is normal. No respiratory distress.      Breath sounds: No wheezing, rhonchi or rales.   Neurological:      General: No focal deficit present.      Mental Status: She is alert.                                            Assessment and Plan:   1. Migraine without status migrainosus, not intractable, unspecified migraine type  - topiramate (TOPAMAX) 100 MG tablet; Take 1 tablet (100 mg total) by mouth daily  Dispense: 30 tablet; Refill: 5    Chronic, uncontrolled  Increase from 75mg  to 100mg  topamax qhs  Continue nurtec prn    2. Family history of breast cancer    Reviewed note from Dr. Thelma Comp  Mom actually did get genetic testing.  She is getting that information for Dr. Everlena Cooper office.   Patient Got mammogram and [place]     Return in about 1 month (around 05/18/2021) for migraine f/u .       Candie Echevaria, PA-C 05/23/2021 12:44 PM           This note was completed using Dragon medical speech recognition software. Grammatical errors, random word insertions, pronoun errors, incorrect word insertion, misspellings and incomplete sentences are occasional consequences of this technology due to software limitations. If there are questions or concerns about the content of this note, or information contained within the body of this dictation, they should be addressed with the provider for clarification.

## 2021-05-15 ENCOUNTER — Ambulatory Visit (RURAL_HEALTH_CENTER): Payer: BC Managed Care – PPO | Admitting: Physician Assistant

## 2021-05-16 ENCOUNTER — Ambulatory Visit
Admission: RE | Admit: 2021-05-16 | Discharge: 2021-05-16 | Disposition: A | Discharge status: Home / Self Care | Payer: BC Managed Care – PPO | Source: Ambulatory Visit | Attending: Surgery | Admitting: Surgery

## 2021-05-16 ENCOUNTER — Telehealth (INDEPENDENT_AMBULATORY_CARE_PROVIDER_SITE_OTHER): Payer: Self-pay

## 2021-05-16 DIAGNOSIS — Z1231 Encounter for screening mammogram for malignant neoplasm of breast: Secondary | ICD-10-CM

## 2021-05-16 DIAGNOSIS — Z803 Family history of malignant neoplasm of breast: Secondary | ICD-10-CM | POA: Insufficient documentation

## 2021-05-16 DIAGNOSIS — Z9189 Other specified personal risk factors, not elsewhere classified: Secondary | ICD-10-CM

## 2021-05-16 DIAGNOSIS — Z1239 Encounter for other screening for malignant neoplasm of breast: Secondary | ICD-10-CM | POA: Insufficient documentation

## 2021-05-16 NOTE — Telephone Encounter (Signed)
-----   Message from Renea Ee, MD sent at 05/16/2021  4:08 PM EDT -----  NB/ST/GS --  Please let patient know breast mammogram and ultrasound showed nothing worrisome.    Remind her of the need to have an annual breast exam by her PCP or GYN, as well as annual mammograms and ultrasounds.    Follow-up here if needed for breast concerns or if she has an update about her mother's genetic testing status.    JM   FYI    Thanks,  AM

## 2021-05-16 NOTE — Telephone Encounter (Signed)
Spoke with patient. Let her know that her mammogram and ultrasound showed nothing worrisome. She was reminded of the need to have an annual breast exam by her PCP or GYN as well as annual mammograms and ultrasounds. She will follow up here if needed for breast concerns or if she has an update about her mother's genetic testing status.

## 2021-06-01 ENCOUNTER — Ambulatory Visit (RURAL_HEALTH_CENTER): Payer: BC Managed Care – PPO | Admitting: Nurse Practitioner

## 2021-06-15 ENCOUNTER — Ambulatory Visit: Payer: BC Managed Care – PPO | Attending: Nurse Practitioner | Admitting: Nurse Practitioner

## 2021-06-15 ENCOUNTER — Encounter (RURAL_HEALTH_CENTER): Payer: Self-pay | Admitting: Nurse Practitioner

## 2021-06-15 DIAGNOSIS — G43909 Migraine, unspecified, not intractable, without status migrainosus: Secondary | ICD-10-CM

## 2021-06-15 DIAGNOSIS — F439 Reaction to severe stress, unspecified: Secondary | ICD-10-CM

## 2021-06-15 MED ORDER — ESCITALOPRAM OXALATE 10 MG PO TABS
10.0000 mg | ORAL_TABLET | Freq: Every day | ORAL | 2 refills | Status: DC
Start: 2021-06-15 — End: 2021-07-10

## 2021-06-15 NOTE — Progress Notes (Signed)
Demographics:     Date Time: 06/15/2021 2:12 PM  Patient Name: Debra Gomez, Debra Gomez  Date of Birth: 10/08/1986  Age: 35 y.o.   Primary Care Provider: Verdene Rio, PA    Chief Complaint:     Chief Complaint   Patient presents with    Follow-up     Migraines, last migraine yesterday, needs dose adjusted on med     History of Presenting Illness:     Giani is a 35 y.o. female that presents today with a history of The primary encounter diagnosis was Situational stress. A diagnosis of Migraine without status migrainosus, not intractable, unspecified migraine type was also pertinent to this visit.    This visit was conducted with the use of a HIPPA compliant interactive (audio / video) telecommunication that permitted real time communication between [place] and me. He/She verbally consented to participation and received services at home to minimize exposure to COVID-19, while I was located at, [facility].    The patient relates her increase in headaches to stress due to her separation from her husband. The last increased dosage of Topamax had decreased the patient's frequency of headaches until she started to have increased stress at home.    Migraine   This is a new problem. The current episode started yesterday. The problem has been resolved. The pain is located in the Frontal and occipital region. The pain does not radiate. The quality of the pain is described as aching. Associated symptoms include nausea. The symptoms are aggravated by noise. Treatments tried: nurtec, topamax. The treatment provided moderate relief.      The patient reports a pain level of    out of 10.    The following portions of the patient's history were reviewed and updated as appropriate: allergies, current medications, past family history, past medical history, past social history, past surgical history, and problem list.    Medical History     Past Medical History:   Diagnosis Date    Migraines     Reactive airway disease       History  reviewed. No pertinent surgical history.   (Not in a hospital admission)    Allergies   Allergen Reactions    Imitrex [Sumatriptan] Shortness Of Breath and Tachycardia    Sulfa Antibiotics       Social History     Tobacco Use    Smoking status: Former     Packs/day: 0.50     Years: 15.00     Pack years: 7.50     Types: Cigarettes     Quit date: 11/29/2020     Years since quitting: 0.5    Smokeless tobacco: Never   Substance Use Topics    Alcohol use: Yes     Comment: Rare      Family History   Problem Relation Age of Onset    Diabetes Mother     Breast cancer Mother 51        again at 39    No known problems Father     Heart disease Maternal Grandmother     Heart disease Maternal Grandfather     Breast cancer Paternal Grandmother     No known problems Paternal Grandfather         Review of Systems:      Review of Systems   Gastrointestinal:  Positive for nausea.   All other systems reviewed and are negative.     Objective:     There were  no vitals filed for this visit.  There is no height or weight on file to calculate BMI.    The below physical exam was executed via audio/visual program with the patient performing self-examination and reporting the below findings to this provider:    Physical Exam  Vitals reviewed.   HENT:      Head: Normocephalic.   Eyes:      Pupils: Pupils are equal, round, and reactive to light.   Pulmonary:      Effort: Pulmonary effort is normal.   Neurological:      Mental Status: She is alert and oriented to person, place, and time.   Psychiatric:         Behavior: Behavior normal.     Hearing and Vision Screens:     No results found.       Image Review:         Lab Review:     Results       None            Assessment:     1. Situational stress    2. Migraine without status migrainosus, not intractable, unspecified migraine type        Plan:     Time spent in discussion: 20 minutes     The patient was counseled on possible medication/treatment side effects. The patient was encouraged to take  any and all medications as prescribed, drink plenty of fluids and to get plenty of rest. The patient was instructed to follow up with their primary care provider. The patient was also instructed to report directly to the emergency department if they experience worsening symptoms and to follow up in 2-3 days if no improvement in symptoms. The patient was instructed to keep all current healthcare appointments. At the conclusion of the visit we reviewed diagnosis, treatment plan, diagnostic tests, prescriptions and follow up instructions pertaining to this visit. The primary encounter diagnosis was Situational stress. A diagnosis of Migraine without status migrainosus, not intractable, unspecified migraine type was also pertinent to this visit.. All patient questions and concerns regarding the encounter diagnosis was addressed.    Orders as below:    1. Situational stress  - escitalopram (Lexapro) 10 MG tablet; Take 1 tablet (10 mg total) by mouth daily  Dispense: 30 tablet; Refill: 2    2. Migraine without status migrainosus, not intractable, unspecified migraine type  - escitalopram (Lexapro) 10 MG tablet; Take 1 tablet (10 mg total) by mouth daily  Dispense: 30 tablet; Refill: 2    Continue all medications at current dose, keep a migraine diary, consider referral to neurology if headaches fail to decrease.  Follow up plan:  Return in about 4 weeks (around 07/13/2021), or if symptoms worsen or fail to improve.  Advised to RTC sooner with any new or worsening symptoms.  Patient verbalized understanding and agreement to plan.      No orders of the defined types were placed in this encounter.     - MAR ACTION REPORT  (last 24 hrs)           ** No medications to display **          New Prescriptions    ESCITALOPRAM (LEXAPRO) 10 MG TABLET    Take 1 tablet (10 mg total) by mouth daily     Modified Medications    No medications on file       Signed electronically by  Montez Morita, NP 2:12 PM

## 2021-07-10 ENCOUNTER — Other Ambulatory Visit (RURAL_HEALTH_CENTER): Payer: Self-pay | Admitting: Physician Assistant

## 2021-07-10 DIAGNOSIS — G43909 Migraine, unspecified, not intractable, without status migrainosus: Secondary | ICD-10-CM

## 2021-07-10 DIAGNOSIS — F439 Reaction to severe stress, unspecified: Secondary | ICD-10-CM

## 2021-07-10 MED ORDER — ESCITALOPRAM OXALATE 10 MG PO TABS
10.0000 mg | ORAL_TABLET | Freq: Every day | ORAL | 0 refills | Status: DC
Start: 2021-07-10 — End: 2021-11-02

## 2021-07-10 NOTE — Telephone Encounter (Signed)
[facility] is requesting 90 day prescriptions for the patient.      Last office visit: 06/15/2021  Last prescribed on: 06/15/2021 #30  Last pertinent date of labs: 02/24/2021  Next follow up visit: None

## 2021-09-01 ENCOUNTER — Other Ambulatory Visit
Admission: RE | Admit: 2021-09-01 | Discharge: 2021-09-01 | Disposition: A | Discharge status: Home / Self Care | Payer: BC Managed Care – PPO | Source: Ambulatory Visit | Attending: Obstetrics & Gynecology | Admitting: Obstetrics & Gynecology

## 2021-09-01 ENCOUNTER — Encounter (RURAL_HEALTH_CENTER): Payer: Self-pay | Admitting: Obstetrics & Gynecology

## 2021-09-01 ENCOUNTER — Ambulatory Visit: Payer: BC Managed Care – PPO | Attending: Obstetrics & Gynecology | Admitting: Obstetrics & Gynecology

## 2021-09-01 VITALS — BP 158/90 | Ht 67.0 in | Wt 339.0 lb

## 2021-09-01 DIAGNOSIS — Z01419 Encounter for gynecological examination (general) (routine) without abnormal findings: Secondary | ICD-10-CM

## 2021-09-01 MED ORDER — NORETHINDRONE 0.35 MG PO TABS
1.0000 | ORAL_TABLET | Freq: Every day | ORAL | 3 refills | Status: DC
Start: 2021-09-01 — End: 2022-03-30

## 2021-09-01 NOTE — Progress Notes (Signed)
Subjective:    Patient ID: Debra Gomez is a 35 y.o. female.    Gynecologic Exam   35 year old patient here for a well woman exam.  Going through divorce, currently sexually active.  Interested in contraception.  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    The following portions of the patient's history were reviewed and updated as appropriate: allergies, current medications, past family history, past medical history, past social history, past surgical history, and problem list.    Review of Systems   Constitutional: Negative.    Genitourinary: Negative.    Psychiatric/Behavioral: Negative.           Objective:    Physical Exam  Vitals and nursing note reviewed. Exam conducted with a chaperone present.   Constitutional:       Appearance: Normal appearance. She is obese.   Pulmonary:      Effort: Pulmonary effort is normal.   Chest:   Breasts:     Right: No swelling, bleeding, inverted nipple, mass, nipple discharge, skin change or tenderness.      Left: No swelling, bleeding, inverted nipple, mass, nipple discharge, skin change or tenderness.   Abdominal:      General: There is no distension.      Palpations: Abdomen is soft. There is no mass.      Tenderness: There is no guarding or rebound.   Genitourinary:     Labia:         Right: No rash, tenderness, lesion or injury.         Left: No rash, tenderness, lesion or injury.       Vagina: No signs of injury. No vaginal discharge, erythema, tenderness or bleeding.      Cervix: No cervical motion tenderness, friability, lesion, erythema or cervical bleeding.      Uterus: Not deviated, not enlarged, not fixed and no uterine prolapse.       Adnexa: Right adnexa normal and left adnexa normal.      Comments: Difficult exam due to body habitus.  Skin:     Coloration: Skin is not jaundiced or pale.      Findings: No bruising.   Neurological:      Mental Status: She is alert.   Psychiatric:         Mood and Affect: Mood normal.           Assessment:       1. Well woman exam           Plan:       Pap smear done.  Mother is a breast cancer survivor twice.  Patient is being followed by breast surgeon.  Due to migraines with aura, her options for contraception are limited.  Interested in Medical sales representative.  Reviewed risks, benefits, side effects and contraindications.  Understands the effect of these to birth control systems is compromised somewhat due to her weight.  All questions answered.

## 2021-09-06 LAB — VH HPV HIGH RISK SCREEN (TMA): HPV High Risk: NOT DETECTED

## 2021-09-06 LAB — VH PAP TEST THIN PREP: Pap Test Thin Prep: NEGATIVE

## 2021-09-12 ENCOUNTER — Other Ambulatory Visit (RURAL_HEALTH_CENTER): Payer: Self-pay | Admitting: Physician Assistant

## 2021-09-12 DIAGNOSIS — G43109 Migraine with aura, not intractable, without status migrainosus: Secondary | ICD-10-CM

## 2021-10-02 ENCOUNTER — Other Ambulatory Visit (RURAL_HEALTH_CENTER): Payer: Self-pay | Admitting: Physician Assistant

## 2021-10-02 DIAGNOSIS — G43909 Migraine, unspecified, not intractable, without status migrainosus: Secondary | ICD-10-CM

## 2021-11-02 ENCOUNTER — Encounter (RURAL_HEALTH_CENTER): Payer: Self-pay | Admitting: Obstetrics & Gynecology

## 2021-11-02 ENCOUNTER — Ambulatory Visit: Payer: BC Managed Care – PPO | Attending: Obstetrics & Gynecology | Admitting: Obstetrics & Gynecology

## 2021-11-02 VITALS — BP 142/90 | Ht 67.0 in | Wt 349.6 lb

## 2021-11-02 DIAGNOSIS — Z30017 Encounter for initial prescription of implantable subdermal contraceptive: Secondary | ICD-10-CM

## 2021-11-02 LAB — POCT PREGNANCY TEST, URINE HCG: POCT Pregnancy HCG Test, UR: NEGATIVE

## 2021-11-02 MED ORDER — ETONOGESTREL 68 MG SC IMPL
1.0000 | DRUG_IMPLANT | Freq: Once | SUBCUTANEOUS | Status: AC
Start: 2021-11-02 — End: 2021-11-02
  Administered 2021-11-02: 09:00:00 68 mg via SUBCUTANEOUS

## 2021-11-02 NOTE — Progress Notes (Signed)
NEXPLANON INSERTION NOTE    Date Time: @IPTODAYDATE @ 8:32 AM     Patient Name: Debra Gomez,Debra Gomez  Attending Physician: No att. providers found    CSN    This 36 y.o. year old female [id number] now desires nexplanon placement for birth control. She understands risks and benefits and has signed appropriate consents.     Procedure:   Under sterile precautions, 1% Lidocaine was injected with a 1.5 in needle in the area of placement.   Nexplanon inserted subdermally in the left arm under local anesthesia and sterile condition. Hemostasis obtained. Patient tolerated the procedure well. No complications. Nexplanon felt by the patient and the physician. Moderate Bruising iIs expected.     Pressure dressing was done. After placement of Steri strips arccos the wound.   NEXPLANON : Lot # [id number]     Exp: nov 2024   NEXPLANON [facility] # : [facility]     Assessment:   Contraceptive management, insertion subdermal contraceptive:    Plan:   Return to office in for recheck 4 wks, or as needed.   Patient was advised they may notice bruising and some pain. OTC pain relievers.     [id number], MD  11/02/2021

## 2022-03-30 ENCOUNTER — Ambulatory Visit: Payer: BC Managed Care – PPO | Attending: Physician Assistant | Admitting: Physician Assistant

## 2022-03-30 ENCOUNTER — Encounter (RURAL_HEALTH_CENTER): Payer: Self-pay | Admitting: Physician Assistant

## 2022-03-30 VITALS — BP 120/88 | HR 63 | Temp 99.0°F | Ht 67.0 in | Wt 363.4 lb

## 2022-03-30 DIAGNOSIS — F439 Reaction to severe stress, unspecified: Secondary | ICD-10-CM

## 2022-03-30 DIAGNOSIS — Z6841 Body Mass Index (BMI) 40.0 and over, adult: Secondary | ICD-10-CM

## 2022-03-30 DIAGNOSIS — Z0000 Encounter for general adult medical examination without abnormal findings: Secondary | ICD-10-CM

## 2022-03-30 DIAGNOSIS — Z1159 Encounter for screening for other viral diseases: Secondary | ICD-10-CM

## 2022-03-30 DIAGNOSIS — G43909 Migraine, unspecified, not intractable, without status migrainosus: Secondary | ICD-10-CM

## 2022-03-30 NOTE — Progress Notes (Signed)
[facility]  [address] [place], [address]  [phone]       Comprehensive Physical Exam      Date Time: 03/30/2022 1:25 PM  Patient Name: Debra Gomez, Debra Gomez  DOB: Dec 23, 1985                                                     Subjective   Mercer Stallworth is a 36 y.o. female who presents today for a routine physical.        HPI:  Chief Complaint   Patient presents with    Annual Exam     PHQ 2  GAD 5     Health Maintenance   Topic Date Due    HEPATITIS C SCREENING  Never done    INFLUENZA VACCINE  05/08/2022    DEPRESSION SCREENING  03/31/2023    Annual Exam  03/31/2023    Cervical Cancer Screening Five Years  09/06/2026    Tetanus Ten-Year  03/11/2031    MENINGOCOCCAL VACCINE  Aged Out    COVID-19 Vaccine  Discontinued     Pt here for her annual today  See heath maintenance above  Pt is no longer on topamax, she forgot to take it during all of the stress and then was afraid to restart it and has now been off of it since December and has only had 1-2 migraines. Continues nurtec prn.    Pt recently moved back home with husband in march. She is frustrated by her weight gain. She joined a gym and for the last few months Goes 5-6 times per week, doing cardio mostly. Diet consists of monitoring calories, low carb diet, vegetables, chicken, very little dining out.     Pt now Has nexplanon, placed 11/02/21 by her OBGYN, Dr. Bradly Bienenstock      PMH:  Past Medical History:   Diagnosis Date    Migraines     Reactive airway disease        Surgical History:  History reviewed. No pertinent surgical history.    Family History:  Family History   Problem Relation Age of Onset    Diabetes Mother     Breast cancer Mother 39        again at 38    No known problems Father     Heart disease Maternal Grandmother     Heart disease Maternal Grandfather     Breast cancer Paternal Grandmother     No known problems  Paternal Grandfather        Social History:  Social History     Tobacco Use    Smoking status: Former     Packs/day: 0.50     Years: 15.00     Total pack years: 7.50     Types: Cigarettes     Quit date: 11/29/2020     Years since quitting: 1.3    Smokeless tobacco: Never   Vaping Use    Vaping Use: Every day    Start date: 11/29/2020   Substance Use Topics    Alcohol use: Yes     Comment: Rare    Drug use: Never       Allergies:  Allergies   Allergen Reactions    Imitrex [Sumatriptan] Shortness Of Breath and Tachycardia    Sulfa Antibiotics  Medications:  Current/Home Medications    NEXPLANON 68 MG IMPLANT        NURTEC 75 MG TABLET DISPERSIBLE    TAKE 1 TABLET (75 MG TOTAL) BY MOUTH AS NEEDED (AT EARLIEST ONSET OF MIGRAINE)       ROS:  Review of Systems   Constitutional:  Negative for chills, fever and unexpected weight change.   HENT:  Negative for congestion, ear pain, hearing loss, sore throat and trouble swallowing.    Eyes:  Negative for pain, redness and visual disturbance.   Respiratory:  Negative for cough, chest tightness and shortness of breath.    Cardiovascular:  Negative for chest pain, palpitations and leg swelling.   Gastrointestinal:  Negative for abdominal pain, blood in stool, constipation, diarrhea, nausea and vomiting.   Endocrine: Negative for polydipsia and polyuria.   Genitourinary:  Negative for dysuria, hematuria, menstrual problem and pelvic pain.   Musculoskeletal:  Negative for myalgias.   Skin:  Negative for rash.   Allergic/Immunologic: Negative for environmental allergies.   Neurological:  Negative for dizziness, numbness and headaches.   Psychiatric/Behavioral:  Negative for confusion and dysphoric mood. The patient is not nervous/anxious.                                                        Objective     Vitals:    03/30/22 1259   BP: 120/88   Pulse: 63   Temp: 99 F (37.2 C)   TempSrc: Tympanic   SpO2: 94%   Weight: (!) 164.8 kg (363 lb 6.4 oz)   Height: 1.702 m (5\' 7" )      Body mass index is 56.92 kg/m.    Vision Screening    Right eye Left eye Both eyes   Without correction      With correction 20/20 20/20 20/20        Physical Exam:  Physical Exam  Vitals and nursing note reviewed.   Constitutional:       General: She is not in acute distress.     Appearance: Normal appearance. She is not ill-appearing or toxic-appearing.   HENT:      Head: Normocephalic and atraumatic.      Right Ear: Tympanic membrane, ear canal and external ear normal. There is no impacted cerumen.      Left Ear: Tympanic membrane, ear canal and external ear normal. There is no impacted cerumen.      Nose: Nose normal.      Mouth/Throat:      Mouth: Mucous membranes are moist.      Pharynx: No oropharyngeal exudate or posterior oropharyngeal erythema.   Eyes:      General: No scleral icterus.     Extraocular Movements: Extraocular movements intact.      Conjunctiva/sclera: Conjunctivae normal.      Pupils: Pupils are equal, round, and reactive to light.   Neck:      Thyroid: No thyromegaly or thyroid tenderness.      Vascular: No carotid bruit.   Cardiovascular:      Rate and Rhythm: Normal rate and regular rhythm.      Pulses: Normal pulses.      Heart sounds: Normal heart sounds. No murmur heard.     No friction rub. No gallop.   Pulmonary:  Effort: Pulmonary effort is normal. No respiratory distress.      Breath sounds: Normal breath sounds. No wheezing, rhonchi or rales.   Abdominal:      General: There is no distension.      Palpations: Abdomen is soft.      Tenderness: There is no abdominal tenderness. There is no guarding or rebound.   Musculoskeletal:      Cervical back: Normal range of motion.      Right lower leg: No edema.      Left lower leg: No edema.   Lymphadenopathy:      Cervical: No cervical adenopathy.   Skin:     General: Skin is warm and dry.      Findings: No rash.   Neurological:      General: No focal deficit present.      Mental Status: She is alert. Mental status is at baseline.    Psychiatric:         Mood and Affect: Mood normal.         Behavior: Behavior normal.         Thought Content: Thought content normal.         Judgment: Judgment normal.         Labs:  No visits with results within 2 Week(s) from this visit.   Latest known visit with results is:   Procedure visit on 11/02/2021   Component Date Value Ref Range Status    POCT QC 11/02/2021 Pass   Final    POCT Pregnancy HCG Test, UR 11/02/2021 Negative  Negative Final    Comment: 11/02/2021 Negative Value is Normal in Healthy Males or Healthy non-pregnant Females   Final   ]    Rads:  Radiological Procedure reviewed. No results found.                                           Assessment & Plan   1. Annual physical exam  - Comprehensive metabolic panel; Future  - Lipid panel; Future  - CBC and differential; Future  - TSH, Abn Reflex to Free T4, Serum; Future    2. Migraine without status migrainosus, not intractable, unspecified migraine type  Chronic, well controlled   Previously on topamax for prophylaxis   Doing well x 6 months without,   Has nurtec prn for abortive therapy, continue as needed as prescribed    3. Situational stress  GAD7 score 5  PHQ9 score 2  Improving now that she has moved back in with her husband  Pt feels things are under control at this time.     4. Obesity  Reviewed diet and lifestyle recommendations  Commended her on her steps toward a healthy lifestyle  Will get labs today  Discussed possible referral to metabolic and bariatric office in [place]       Candie Echevaria, PA-C 03/30/2022 1:25 PM         This note was completed using Dragon medical speech recognition software. Grammatical errors, random word insertions, pronoun errors, incorrect word insertion, misspellings and incomplete sentences are occasional consequences of this technology due to software limitations. If there are questions or concerns about the content of this note, or information contained within the body of this dictation, they should  be addressed with the provider for clarification.

## 2022-04-03 ENCOUNTER — Ambulatory Visit
Admission: RE | Admit: 2022-04-03 | Discharge: 2022-04-03 | Disposition: A | Discharge status: Home / Self Care | Payer: BC Managed Care – PPO | Source: Ambulatory Visit | Attending: Physician Assistant | Admitting: Physician Assistant

## 2022-04-03 DIAGNOSIS — Z1159 Encounter for screening for other viral diseases: Secondary | ICD-10-CM | POA: Insufficient documentation

## 2022-04-03 DIAGNOSIS — Z0000 Encounter for general adult medical examination without abnormal findings: Secondary | ICD-10-CM | POA: Insufficient documentation

## 2022-04-03 DIAGNOSIS — Z6841 Body Mass Index (BMI) 40.0 and over, adult: Secondary | ICD-10-CM | POA: Insufficient documentation

## 2022-04-03 LAB — COMPREHENSIVE METABOLIC PANEL
ALT: 14 U/L (ref 0–55)
AST (SGOT): 13 U/L (ref 10–42)
Albumin/Globulin Ratio: 1.03 Ratio (ref 0.80–2.00)
Albumin: 3.9 gm/dL (ref 3.5–5.0)
Alkaline Phosphatase: 50 U/L (ref 40–145)
Anion Gap: 11.8 mMol/L (ref 7.0–18.0)
BUN / Creatinine Ratio: 16.3 Ratio (ref 10.0–30.0)
BUN: 13 mg/dL (ref 7–22)
Bilirubin, Total: 0.3 mg/dL (ref 0.1–1.2)
CO2: 23.4 mMol/L (ref 20.0–30.0)
Calcium: 9.3 mg/dL (ref 8.5–10.5)
Chloride: 107 mMol/L (ref 98–110)
Creatinine: 0.8 mg/dL (ref 0.60–1.20)
EGFR: 100 mL/min/{1.73_m2} (ref 60–150)
Globulin: 3.8 gm/dL (ref 2.0–4.0)
Glucose: 99 mg/dL (ref 71–99)
Osmolality Calculated: 276 mOsm/kg (ref 275–300)
Potassium: 4.2 mMol/L (ref 3.5–5.3)
Protein, Total: 7.7 gm/dL (ref 6.0–8.3)
Sodium: 138 mMol/L (ref 136–147)

## 2022-04-03 LAB — CBC AND DIFFERENTIAL
Basophils %: 1.3 % (ref 0.0–3.0)
Basophils Absolute: 0.1 10*3/uL (ref 0.0–0.3)
Eosinophils %: 2.1 % (ref 0.0–7.0)
Eosinophils Absolute: 0.2 10*3/uL (ref 0.0–0.8)
Hematocrit: 39.4 % (ref 36.0–48.0)
Hemoglobin: 12.8 gm/dL (ref 12.0–16.0)
Lymphocytes Absolute: 2.1 10*3/uL (ref 0.6–5.1)
Lymphocytes: 28.7 % (ref 15.0–46.0)
MCH: 29 pg (ref 28–35)
MCHC: 33 gm/dL (ref 31–36)
MCV: 88 fL (ref 80–100)
MPV: 7.8 fL (ref 6.0–10.0)
Monocytes Absolute: 0.5 10*3/uL (ref 0.1–1.7)
Monocytes: 6.7 % (ref 3.0–15.0)
Neutrophils %: 61 % (ref 42.0–78.0)
Neutrophils Absolute: 4.4 10*3/uL (ref 1.7–8.6)
PLT CT: 276 10*3/uL (ref 130–440)
RBC: 4.46 10*6/uL (ref 3.80–5.00)
RDW: 12.1 % (ref 10.5–14.5)
WBC: 7.2 10*3/uL (ref 4.0–11.0)

## 2022-04-03 LAB — LIPID PANEL
Cholesterol: 167 mg/dL (ref 75–199)
Coronary Heart Disease Risk: 3.98
HDL: 42 mg/dL — ABNORMAL LOW (ref 45–65)
LDL Calculated: 106 mg/dL
Triglycerides: 93 mg/dL (ref 10–150)
VLDL: 19 (ref 0–40)

## 2022-04-03 LAB — HEPATITIS C ANTIBODY: Hepatitis C Antibody: NONREACTIVE

## 2022-04-03 LAB — HEMOGLOBIN A1C
Estimated Average Glucose: 120 mg/dL
Hgb A1C, %: 5.8 %

## 2022-04-03 LAB — THYROID STIMULATING HORMONE (TSH), REFLEX ON ABNORMAL TO FREE T4, SERUM: TSH (Reflex Free T4 if Indicated): 2.62 u[IU]/mL (ref 0.40–4.20)

## 2022-04-22 ENCOUNTER — Encounter (HOSPITAL_COMMUNITY): Payer: Self-pay

## 2022-04-22 ENCOUNTER — Emergency Department (HOSPITAL_COMMUNITY): Payer: Self-pay

## 2022-04-22 ENCOUNTER — Emergency Department (HOSPITAL_COMMUNITY)
Admission: EM | Admit: 2022-04-22 | Discharge: 2022-04-22 | Payer: Self-pay | Attending: Emergency Medicine | Admitting: Emergency Medicine

## 2022-04-22 ENCOUNTER — Other Ambulatory Visit: Payer: Self-pay

## 2022-04-22 DIAGNOSIS — W540XXA Bitten by dog, initial encounter: Secondary | ICD-10-CM | POA: Insufficient documentation

## 2022-04-22 DIAGNOSIS — Z23 Encounter for immunization: Secondary | ICD-10-CM | POA: Insufficient documentation

## 2022-04-22 DIAGNOSIS — Z203 Contact with and (suspected) exposure to rabies: Secondary | ICD-10-CM | POA: Insufficient documentation

## 2022-04-22 DIAGNOSIS — Z2914 Encounter for prophylactic rabies immune globin: Secondary | ICD-10-CM | POA: Insufficient documentation

## 2022-04-22 DIAGNOSIS — R531 Weakness: Secondary | ICD-10-CM | POA: Insufficient documentation

## 2022-04-22 LAB — URINALYSIS, ROUTINE W REFLEX MICROSCOPIC
Bilirubin Urine: NEGATIVE
Glucose, UA: NEGATIVE mg/dL
Hgb urine dipstick: NEGATIVE
Ketones, ur: 5 mg/dL — AB
Nitrite: NEGATIVE
Protein, ur: NEGATIVE mg/dL
Specific Gravity, Urine: 1.018 (ref 1.005–1.030)
pH: 6 (ref 5.0–8.0)

## 2022-04-22 LAB — CBC WITH DIFFERENTIAL/PLATELET
Abs Immature Granulocytes: 0.01 10*3/uL (ref 0.00–0.07)
Basophils Absolute: 0 10*3/uL (ref 0.0–0.1)
Basophils Relative: 0 %
Eosinophils Absolute: 0.1 10*3/uL (ref 0.0–0.5)
Eosinophils Relative: 1 %
HCT: 36.5 % (ref 36.0–46.0)
Hemoglobin: 11.9 g/dL — ABNORMAL LOW (ref 12.0–15.0)
Immature Granulocytes: 0 %
Lymphocytes Relative: 18 %
Lymphs Abs: 1.5 10*3/uL (ref 0.7–4.0)
MCH: 27.7 pg (ref 26.0–34.0)
MCHC: 32.6 g/dL (ref 30.0–36.0)
MCV: 84.9 fL (ref 80.0–100.0)
Monocytes Absolute: 0.6 10*3/uL (ref 0.1–1.0)
Monocytes Relative: 7 %
Neutro Abs: 6.2 10*3/uL (ref 1.7–7.7)
Neutrophils Relative %: 74 %
Platelets: 233 10*3/uL (ref 150–400)
RBC: 4.3 MIL/uL (ref 3.87–5.11)
RDW: 13.3 % (ref 11.5–15.5)
WBC: 8.4 10*3/uL (ref 4.0–10.5)
nRBC: 0 % (ref 0.0–0.2)

## 2022-04-22 LAB — COMPREHENSIVE METABOLIC PANEL
ALT: 20 U/L (ref 0–44)
AST: 30 U/L (ref 15–41)
Albumin: 3.8 g/dL (ref 3.5–5.0)
Alkaline Phosphatase: 32 U/L — ABNORMAL LOW (ref 38–126)
Anion gap: 11 (ref 5–15)
BUN: 8 mg/dL (ref 6–20)
CO2: 22 mmol/L (ref 22–32)
Calcium: 8.9 mg/dL (ref 8.9–10.3)
Chloride: 108 mmol/L (ref 98–111)
Creatinine, Ser: 0.83 mg/dL (ref 0.44–1.00)
GFR, Estimated: >60 mL/min (ref >60)
Glucose, Bld: 114 mg/dL — ABNORMAL HIGH (ref 70–99)
Potassium: 3.1 mmol/L — ABNORMAL LOW (ref 3.5–5.1)
Sodium: 141 mmol/L (ref 135–145)
Total Bilirubin: 0.5 mg/dL (ref 0.3–1.2)
Total Protein: 7.4 g/dL (ref 6.5–8.1)

## 2022-04-22 LAB — I-STAT BETA HCG BLOOD, ED (MC, WL, AP ONLY): I-stat hCG, quantitative: <5 m[IU]/mL (ref <5)

## 2022-04-22 LAB — RAPID URINE DRUG SCREEN, HOSP PERFORMED
Amphetamines: NOT DETECTED
Barbiturates: NOT DETECTED
Benzodiazepines: NOT DETECTED
Cocaine: POSITIVE — AB
Opiates: NOT DETECTED
Tetrahydrocannabinol: POSITIVE — AB

## 2022-04-22 LAB — LIPASE, BLOOD: Lipase: 27 U/L (ref 11–51)

## 2022-04-22 LAB — ETHANOL: Alcohol, Ethyl (B): <10 mg/dL (ref <10)

## 2022-04-22 MED ORDER — TETANUS-DIPHTH-ACELL PERTUSSIS 5-2.5-18.5 LF-MCG/0.5 IM SUSY
0.5000 mL | PREFILLED_SYRINGE | Freq: Once | INTRAMUSCULAR | Status: AC
Start: 2022-04-22 — End: 2022-04-22
  Administered 2022-04-22: 0.5 mL via INTRAMUSCULAR
  Filled 2022-04-22: qty 0.5

## 2022-04-22 MED ORDER — ACETAMINOPHEN 325 MG PO TABS
650.0000 mg | ORAL_TABLET | Freq: Once | ORAL | Status: AC
  Administered 2022-04-22: 650 mg via ORAL
  Filled 2022-04-22: qty 2

## 2022-04-22 MED ORDER — AMOXICILLIN-POT CLAVULANATE 875-125 MG PO TABS: 1.0000 | ORAL_TABLET | Freq: Two times a day (BID) | ORAL | 0 refills | Status: AC

## 2022-04-22 MED ORDER — RABIES IMMUNE GLOBULIN 150 UNIT/ML IM INJ: 20.0000 [IU]/kg | INJECTION | Freq: Once | INTRAMUSCULAR | Status: DC

## 2022-04-22 MED ORDER — AMOXICILLIN-POT CLAVULANATE 875-125 MG PO TABS
1.0000 | ORAL_TABLET | Freq: Once | ORAL | Status: AC
Start: 2022-04-22 — End: 2022-04-22
  Administered 2022-04-22: 1 via ORAL
  Filled 2022-04-22: qty 1

## 2022-04-22 MED ORDER — RABIES IMMUNE GLOBULIN 150 UNIT/ML IM INJ
20.0000 [IU]/kg | INJECTION | Freq: Once | INTRAMUSCULAR | Status: AC
  Administered 2022-04-22: 1350 [IU] via INTRAMUSCULAR
  Filled 2022-04-22: qty 10

## 2022-04-22 MED ORDER — SODIUM CHLORIDE 0.9 % IV BOLUS
1000.0000 mL | Freq: Once | INTRAVENOUS | Status: AC
  Administered 2022-04-22: 1000 mL via INTRAVENOUS

## 2022-04-22 MED ORDER — RABIES VACCINE, PCEC IM SUSR
1.0000 mL | Freq: Once | INTRAMUSCULAR | Status: AC
Start: 2022-04-22 — End: 2022-04-22
  Administered 2022-04-22: 1 mL via INTRAMUSCULAR
  Filled 2022-04-22: qty 1

## 2022-04-22 NOTE — ED Triage Notes (Signed)
Pt. BIB [facility] from [facility]. Pt. C/o generalized weakness, dizziness and increased thirst over the past two days. Per EMS pt. Has been in the sun all day.

## 2022-04-22 NOTE — Discharge Instructions (Addendum)
Go back to [facility] on day 3, 7, 14 for your next dose of rabies vaccine.  Please read your rabies information form.  Continue to take antibiotics for dog bite wound.

## 2022-04-22 NOTE — ED Provider Notes (Signed)
[facility]-EMERGENCY DEPT Provider Note   CSN: [id number] Arrival date & time: 04/22/22  1801     History  Chief Complaint  Patient presents with   Weakness    Carolyn Cox is a 36 y.o. female.  Is here with generalized weakness.  Admits to smoking marijuana.  History of polysubstance abuse.  Chronic hepatitis C.  She feels dehydrated.  She has been out in the sun all day.  She was bit by a stray dog on her left wrist she thinks 2 days ago.  Tetanus shot not up-to-date.  She denies any fever, nausea, vomiting.  No diarrhea.  No abdominal pain, cough or sputum production.  Denies any trauma.  The history is provided by the patient.       Home Medications Prior to Admission medications   Medication Sig Start Date End Date Taking? Authorizing Provider  amoxicillin-clavulanate (AUGMENTIN) 875-125 MG tablet Take 1 tablet by mouth every 12 (twelve) hours for 10 days. 04/22/22 05/02/22 Yes Carolyn Hagarty, DO  doxycycline (VIBRAMYCIN) 100 MG capsule Take 1 capsule (100 mg total) by mouth 2 (two) times daily. 04/25/19   Hall-Potvin, [place], PA-C      Allergies    Patient has no known allergies.    Review of Systems   Review of Systems  Physical Exam Updated Vital Signs BP 106/76   Pulse 78   Temp 98.4 F (36.9 C) (Oral)   Resp 16   Ht 5\' 6"  (1.676 m)   Wt 68 kg   SpO2 97%   BMI 24.21 kg/m  Physical Exam Vitals and nursing note reviewed.  Constitutional:      General: She is not in acute distress.    Appearance: She is well-developed.  HENT:     Head: Normocephalic and atraumatic.     Mouth/Throat:     Mouth: Mucous membranes are moist.  Eyes:     Extraocular Movements: Extraocular movements intact.     Conjunctiva/sclera: Conjunctivae normal.     Pupils: Pupils are equal, round, and reactive to light.  Cardiovascular:     Rate and Rhythm: Normal rate and regular rhythm.     Pulses: Normal pulses.     Heart sounds: Normal heart sounds. No  murmur heard. Pulmonary:     Effort: Pulmonary effort is normal. No respiratory distress.     Breath sounds: Normal breath sounds.  Abdominal:     Palpations: Abdomen is soft.     Tenderness: There is no abdominal tenderness.  Musculoskeletal:        General: No swelling.     Cervical back: Neck supple.  Skin:    General: Skin is warm and dry.     Capillary Refill: Capillary refill takes less than 2 seconds.     Comments: Scabbed over wound to her left wrist with no surrounding erythema  Neurological:     General: No focal deficit present.     Mental Status: She is alert.     Comments: 5+ out of 5 strength throughout, normal sensation, no drift, normal finger-nose-finger, normal speech  Psychiatric:        Mood and Affect: Mood normal.     ED Results / Procedures / Treatments   Labs (all labs ordered are listed, but only abnormal results are displayed) Labs Reviewed  CBC WITH DIFFERENTIAL/PLATELET - Abnormal; Notable for the following components:      Result Value   Hemoglobin 11.9 (*)    All other components within normal  limits  COMPREHENSIVE METABOLIC PANEL - Abnormal; Notable for the following components:   Potassium 3.1 (*)    Glucose, Bld 114 (*)    Alkaline Phosphatase 32 (*)    All other components within normal limits  LIPASE, BLOOD  ETHANOL  RAPID URINE DRUG SCREEN, HOSP PERFORMED  URINALYSIS, ROUTINE W REFLEX MICROSCOPIC  I-STAT BETA HCG BLOOD, ED ([facility], [facility], [facility] ONLY)    EKG EKG Interpretation  Date/Time:  Sunday April 22 2022 18:15:06 EDT Ventricular Rate:  85 PR Interval:  149 QRS Duration: 90 QT Interval:  379 QTC Calculation: 451 R Axis:   89 Text Interpretation: Sinus rhythm Confirmed by Carolyn Cox ([id number]) on 04/22/2022 8:32:10 PM  Radiology CT Head Wo Contrast  Result Date: 04/22/2022 CLINICAL DATA:  Generalized weakness, dizziness EXAM: CT HEAD WITHOUT CONTRAST TECHNIQUE: Contiguous axial images were obtained from the base of the skull through  the vertex without intravenous contrast. RADIATION DOSE REDUCTION: This exam was performed according to the departmental dose-optimization program which includes automated exposure control, adjustment of the mA and/or kV according to patient size and/or use of iterative reconstruction technique. COMPARISON:  05/22/2017 FINDINGS: Brain: No acute infarct or hemorrhage. Lateral ventricles and midline structures are unremarkable. No acute extra-axial fluid collections. No mass effect. Vascular: No hyperdense vessel or unexpected calcification. Skull: Normal. Negative for fracture or focal lesion. Sinuses/Orbits: No acute finding. Other: None. IMPRESSION: 1. No acute intracranial process. Electronically Signed   By: Carolyn Cox M.D.   On: 04/22/2022 19:50   DG Chest Portable 1 View  Result Date: 04/22/2022 CLINICAL DATA:  Weakness.  Dizziness. EXAM: PORTABLE CHEST 1 VIEW COMPARISON:  09/18/2017 FINDINGS: Low lung volumes. Normal heart size for technique.The cardiomediastinal contours are normal. Pulmonary vasculature is normal. No consolidation, pleural effusion, or pneumothorax. No acute osseous abnormalities are seen. IMPRESSION: Low lung volumes without acute abnormality. Electronically Signed   By: Carolyn Cox M.D.   On: 04/22/2022 19:04    Procedures Procedures    Medications Ordered in ED Medications  sodium chloride 0.9 % bolus 1,000 mL (0 mLs Intravenous Stopped 04/22/22 2009)  Tdap (BOOSTRIX) injection 0.5 mL (0.5 mLs Intramuscular Given 04/22/22 2003)  rabies vaccine (RABAVERT) injection 1 mL (1 mL Intramuscular Given 04/22/22 2101)  amoxicillin-clavulanate (AUGMENTIN) 875-125 MG per tablet 1 tablet (1 tablet Oral Given 04/22/22 2002)  acetaminophen (TYLENOL) tablet 650 mg (650 mg Oral Given 04/22/22 2002)  rabies immune globulin (HYPERRAB/KEDRAB) injection 1,350 Units (1,350 Units Intramuscular Given 04/22/22 2102)    ED Course/ Medical Decision Making/ A&P                            Medical Decision Making Amount and/or Complexity of Data Reviewed Labs: ordered. Radiology: ordered.  Risk OTC drugs. Prescription drug management.   Carolyn Cox is here with generalized weakness.  History of polysubstance abuse.  Admits to marijuana use.  Has been in the sun all day.  Has felt weak for last 2 days.  She was bit by a stray dog she thinks 2 days ago.  Tetanus shot not up-to-date.  She was bit on the left wrist.  She denies any fevers or chills.  Overall she feels dehydrated.  Neurologically she appears intact.  She is got a wound to the left wrist that is superficial and scabbed over.  No erythema or purulent drainage.  No major laceration.  She denies any alcohol or drug use otherwise.  Denies any abdominal  pain.  Differential diagnosis is likely dehydration, heat syncope, polysubstance abuse, infectious process.  Vital signs are normal.  No fever.  Will give IV fluids, check CBC, BMP, urinalysis, chest x-ray, CT head.  Will give tetanus shot and rabies vaccine/immunoglobulin after discussion with the patient.  Per my review and interpretation of labs there is no significant anemia, electrolyte abnormality, kidney injury, leukocytosis.  Chest x-ray per my review and interpretation shows no pneumonia.  Head CT with no acute findings.  Overall will prescribe Augmentin for dog bite.  She understands the need to get the rest of rabies vaccinations.  Patient discharged.  This chart was dictated using voice recognition software.  Despite best efforts to proofread,  errors can occur which can change the documentation meaning.         Final Clinical Impression(s) / ED Diagnoses Final diagnoses:  Dog bite, initial encounter  Weakness    Rx / DC Orders ED Discharge Orders          Ordered    amoxicillin-clavulanate (AUGMENTIN) 875-125 MG tablet  Every 12 hours        04/22/22 2117              Virgina Norfolk, DO 04/22/22 2118

## 2022-04-22 NOTE — ED Notes (Signed)
Pt aware urine sample need to be collected

## 2022-09-08 ENCOUNTER — Emergency Department
Admission: EM | Admit: 2022-09-08 | Discharge: 2022-09-08 | Disposition: A | Discharge status: Home / Self Care | Payer: Medicaid Other | Attending: Emergency Medicine | Admitting: Emergency Medicine

## 2022-09-08 DIAGNOSIS — O26891 Other specified pregnancy related conditions, first trimester: Secondary | ICD-10-CM | POA: Diagnosis present

## 2022-09-08 DIAGNOSIS — Z008 Encounter for other general examination: Secondary | ICD-10-CM

## 2022-09-08 DIAGNOSIS — Z3A14 14 weeks gestation of pregnancy: Secondary | ICD-10-CM | POA: Diagnosis not present

## 2022-09-08 NOTE — ED Provider Notes (Signed)
[facility] Provider Note    Event Date/Time   First MD Initiated Contact with Patient 09/08/22 0254     (approximate)   History   No chief complaint on file.   HPI  Carolyn Cox is a 36 y.o. female who presents in police custody for medical clearance for jail.  Reportedly she was involved with a traffic stop and was taken to jail as a result of that issue.  When she got to jail she told them that she was [redacted] weeks pregnant and that she had taken a Percocet, and the nurse told her that that was dangerous during pregnancy and and sent her to the ED for clearance.  The patient is sleepy and falls asleep readily, although it is 3:30 in the morning.  She said that she feels fine and has no complaints other than being tired and wanting to go to bed.  She denies shortness of breath.  She has no pain in her chest or her abdomen.  She has had no vaginal bleeding.  She has had no nausea nor vomiting.  She was not involved in a car accident.  She states that she has not had any alcohol tonight and that she took only 1 Percocet earlier this evening.  She did not explain why she took it.  She said that she had it from a previous prescription but that it was from a long time ago.  She states that she understands that she should not take Percocet while pregnant unless specifically prescribed to her.  She reports that she is a G4, P3 at approximately [redacted] weeks gestation and goes to [facility] for prenatal care.     Physical Exam   Triage Vital Signs: ED Triage Vitals  Enc Vitals Group     BP 09/08/22 0121 134/78     Pulse Rate 09/08/22 0121 74     Resp 09/08/22 0121 16     Temp 09/08/22 0121 98.5 F (36.9 C)     Temp src --      SpO2 09/08/22 0121 98 %     Weight 09/08/22 0122 68 kg (149 lb 14.6 oz)     Height 09/08/22 0122 1.676 m (5\' 6" )     Head Circumference --      Peak Flow --      Pain Score 09/08/22 0122 0     Pain Loc --      Pain Edu? --      Excl.  in [place]? --     Most recent vital signs: Vitals:   09/08/22 0121  BP: 134/78  Pulse: 74  Resp: 16  Temp: 98.5 F (36.9 C)  SpO2: 98%     General: Somnolent but awakens easily to soft voice and light touch. CV:  Good peripheral perfusion.  Regular rate and rhythm, normal heart sounds. Resp:  Normal effort.  Lungs are clear to auscultation.  Breathing easily and comfortably and speaking in full sentences. Abd:  No tenderness to palpation. Other:  No focal neurological deficits.  No SI or HI.  Patient is calm, cooperative, and polite.  No warning signs or symptoms from a psychiatric perspective.   ED Results / Procedures / Treatments   Labs (all labs ordered are listed, but only abnormal results are displayed) Labs Reviewed - No data to display    IMPRESSION / MDM / [place] / ED COURSE  I reviewed the triage vital signs and the nursing notes.  Differential diagnosis includes, but is not limited to, medication or drug side effect, overdose, acute psychiatric disturbance, subtle occult injury.  Patient's presentation is most consistent with acute, uncomplicated illness.  Patient's vital signs are stable and within normal limits.  She has not been observed in the emergency department for nearly 2-1/2 hours and she has no hypoxemia and no acute or emergent conditions.  She is somnolent but awakens easily and protects her airway.  There is no indication for lab work or additional drug testing.  When I talked her, she wakes up, makes sense, is alert and oriented, in no distress.  There is no indication for additional observation or admission to the hospital.  I talked with her and the officer with her about this and they both understand and agree that she will be going to jail.       FINAL CLINICAL IMPRESSION(S) / ED DIAGNOSES   Final diagnoses:  Medical clearance for incarceration     Rx / DC Orders   ED Discharge Orders     None         Note:  This document was prepared using Dragon voice recognition software and may include unintentional dictation errors.   Loleta Rose, MD 09/08/22 [phone]

## 2022-09-08 NOTE — ED Notes (Signed)
Patient discharged at this time. Discharged with [employer]. Breathing unlabored speaking in full sentences with symmetric chest rise and fall. Verbalized understanding of all discharge and follow up teaching. Discharged with all belongings.

## 2022-09-08 NOTE — ED Notes (Signed)
Pt appears to be falling asleep while talking with [place] and sts its because she is tired and denies any other drug use. PD at bedside.

## 2022-09-08 NOTE — ED Triage Notes (Signed)
[place] [employer] brought pt in because pt is being arrested and before she was booked into the jail the nurse at the jail stated that because pt took a prescribed percocet prior to jail and because she is approx [redacted] wks pregnant the jail nurse said she had to come to the ER first because per the officer "you're not supposed to take percocet while pregnant" pt denies complaints.

## 2022-09-08 NOTE — Discharge Instructions (Addendum)
This patient is medically cleared for incarceration.  Vital signs were normal in the Emergency Department, and although she is sleepy, she has been protecting her airway, is easily arousable by soft voice and light touch, and is in no distress.  There is no evidence of an acute or emergent medical condition at this time.  The patient should follow up with her regular doctor at the next available opportunity, and visit the medical center at the incarceration center if needed.

## 2022-11-02 ENCOUNTER — Emergency Department
Admission: EM | Admit: 2022-11-02 | Discharge: 2022-11-02 | Discharge status: Against Medical Advice | Payer: Medicaid Other | Attending: Emergency Medicine | Admitting: Emergency Medicine

## 2022-11-02 DIAGNOSIS — Z5321 Procedure and treatment not carried out due to patient leaving prior to being seen by health care provider: Secondary | ICD-10-CM | POA: Diagnosis not present

## 2022-11-02 DIAGNOSIS — R079 Chest pain, unspecified: Secondary | ICD-10-CM | POA: Diagnosis present

## 2022-11-02 NOTE — ED Triage Notes (Addendum)
EMS brings pt in for c/o CP while in custody of [place] [employer] at a traffic stop; pt to lobby via w/c, noted pt immed got out of w/c and left lobby

## 2022-11-02 NOTE — ED Notes (Signed)
No answer when called several times from lobby 

## 2022-11-05 ENCOUNTER — Inpatient Hospital Stay (HOSPITAL_BASED_OUTPATIENT_CLINIC_OR_DEPARTMENT_OTHER): Payer: Medicaid Other

## 2022-11-05 ENCOUNTER — Encounter (HOSPITAL_COMMUNITY): Payer: Self-pay

## 2022-11-05 ENCOUNTER — Inpatient Hospital Stay (HOSPITAL_COMMUNITY)
Admission: AD | Admit: 2022-11-05 | Discharge: 2022-11-05 | Disposition: A | Discharge status: Home / Self Care | Payer: Medicaid Other | Attending: Obstetrics & Gynecology | Admitting: Obstetrics & Gynecology

## 2022-11-05 DIAGNOSIS — B3731 Acute candidiasis of vulva and vagina: Secondary | ICD-10-CM | POA: Diagnosis not present

## 2022-11-05 DIAGNOSIS — O26892 Other specified pregnancy related conditions, second trimester: Secondary | ICD-10-CM | POA: Diagnosis not present

## 2022-11-05 DIAGNOSIS — R109 Unspecified abdominal pain: Secondary | ICD-10-CM

## 2022-11-05 DIAGNOSIS — Z369 Encounter for antenatal screening, unspecified: Secondary | ICD-10-CM | POA: Diagnosis not present

## 2022-11-05 DIAGNOSIS — Z3689 Encounter for other specified antenatal screening: Secondary | ICD-10-CM

## 2022-11-05 DIAGNOSIS — O26899 Other specified pregnancy related conditions, unspecified trimester: Secondary | ICD-10-CM | POA: Diagnosis not present

## 2022-11-05 DIAGNOSIS — Z3A23 23 weeks gestation of pregnancy: Secondary | ICD-10-CM

## 2022-11-05 DIAGNOSIS — R102 Pelvic and perineal pain: Secondary | ICD-10-CM | POA: Diagnosis present

## 2022-11-05 DIAGNOSIS — O98812 Other maternal infectious and parasitic diseases complicating pregnancy, second trimester: Secondary | ICD-10-CM | POA: Diagnosis not present

## 2022-11-05 LAB — WET PREP, GENITAL
Clue Cells Wet Prep HPF POC: NONE SEEN
Sperm: NONE SEEN
Trich, Wet Prep: NONE SEEN
WBC, Wet Prep HPF POC: <10 (ref <10)

## 2022-11-05 LAB — FETAL FIBRONECTIN: Fetal Fibronectin: NEGATIVE

## 2022-11-05 MED ORDER — FLUCONAZOLE 150 MG PO TABS: 150.0000 mg | ORAL_TABLET | Freq: Every day | ORAL | 1 refills | Status: DC

## 2022-11-05 NOTE — MAU Provider Note (Signed)
Chief Complaint:  Contractions   Event Date/Time   First Provider Initiated Contact with Patient 11/05/22 2027     HPI: Carolyn Cox is a 37 y.o. [id number] at [redacted]w[redacted]d who presents to maternity admissions reporting vaginal pressure that "makes me want to push" for the past two days. Tonight she began having more intense pressure with pelvic pain while walking and lower abdominal cramping. Was also in the middle of a very stressful life event - was taken into custody by [employer] who brought her to MAU for evaluation. Notes an increase in discharge with odor over the past couple of weeks. Denies vaginal bleeding, leaking of fluid, decreased fetal movement, fever, falls, or recent illness.   Pregnancy Course: Has been seen at [facility] in the past but has not established routine OB care for this pregnancy yet.  Past Medical History:  Diagnosis Date   Acute systolic heart failure ([place]) 09/2017   Chronic hepatitis C ([place])    Nonischemic cardiomyopathy ([place]) 09/2017   EF 25-30% EF   Polysubstance abuse ([place])    Sciatica    OB History  Gravida Para Term Preterm AB Living  6 3 3  0 2 2  SAB IAB Ectopic Multiple Live Births  0 0 0   2    # Outcome Date GA Lbr Len/2nd Weight Sex Delivery Anes PTL Lv  6 Current           5 Term 08/21/18 [redacted]w[redacted]d      N   4 Term 07/17/16 [redacted]w[redacted]d / 00:02 6 lb 8 oz (2.948 kg) F Vag-Spont None  LIV  3 Term 12/08/08 [redacted]w[redacted]d  8 lb (3.629 kg) F Vag-Spont   LIV  2 AB 2009     TAB     1 AB 2007     TAB      Past Surgical History:  Procedure Laterality Date   LEFT HEART CATH AND CORONARY ANGIOGRAPHY N/A 09/20/2017   Procedure: LEFT HEART CATH AND CORONARY ANGIOGRAPHY;  Surgeon: Wellington Hampshire, MD;  Location: [facility];  Service: Cardiovascular;  Laterality: N/A;   MANDIBLE FRACTURE SURGERY     Family History  Problem Relation Age of Onset   Hypertension Mother    Hypertension Maternal Grandmother    Kidney disease Maternal Grandmother    Kidney disease Maternal Aunt     Social History   Tobacco Use   Smoking status: Some Days    Types: Cigarettes   Smokeless tobacco: Never   Tobacco comments:    less than 1 pack/wk.  Vaping Use   Vaping Use: Never used  Substance Use Topics   Alcohol use: Not Currently   Drug use: Yes    Types: Cocaine, Marijuana    Comment: +UDS for cocaine and MJ 07/27/18 Past use of amphetamines   No Known Allergies No medications prior to admission.   I have reviewed patient's Past Medical Hx, Surgical Hx, Family Hx, Social Hx, medications and allergies.   ROS:  Pertinent items noted in HPI and remainder of comprehensive ROS otherwise negative.   Physical Exam  Patient Vitals for the past 24 hrs:  BP Temp Temp src Pulse Resp SpO2  11/05/22 2219 121/66 98.5 F (36.9 C) Oral 75 17 100 %  11/05/22 2015 122/65 -- -- 77 16 100 %   Constitutional: Well-developed, well-nourished female in acute emotional and physical distress.  Cardiovascular: normal rate & rhythm, warm and well-perfused Respiratory: normal effort, no problems with respiration noted GI: Abd soft,  non-tender, gravid appropriate for gestational age MS: Extremities nontender, no edema, normal ROM Neurologic: Alert and oriented x 4.  GU: no CVA tenderness Pelvic: NEFG, copious discharge with mild odor, no blood Dilation: Closed Effacement (%): Thick Cervical Position: Posterior Station: Ballotable Presentation: Undeterminable Exam by:: Gaylan Gerold, CNM   Fetal Tracing: reactive appropriate for gestational age Baseline: 150 Variability: minimal Accelerations: 5x5 to 10x10 (appropriate for gestational age) Decelerations: none Toco: UI   Labs: Results for orders placed or performed during the hospital encounter of 11/05/22 (from the past 24 hour(s))  Wet prep, genital     Status: Abnormal   Collection Time: 11/05/22  8:29 PM   Specimen: Vaginal  Result Value Ref Range   Yeast Wet Prep HPF POC PRESENT (A) NONE SEEN   Trich, Wet Prep NONE SEEN  NONE SEEN   Clue Cells Wet Prep HPF POC NONE SEEN NONE SEEN   WBC, Wet Prep HPF POC <10 <10   Sperm NONE SEEN   GC/Chlamydia probe amp ([place])not at [facility]     Status: None   Collection Time: 11/05/22  8:29 PM  Result Value Ref Range   Neisseria Gonorrhea Negative    Chlamydia Negative    Comment Normal Reference Ranger Chlamydia - Negative    Comment      Normal Reference Range Neisseria Gonorrhea - Negative  Fetal fibronectin     Status: None   Collection Time: 11/05/22  8:29 PM  Result Value Ref Range   Fetal Fibronectin NEGATIVE NEGATIVE   Imaging:    Media Information   Document Information  Ultrasound    11/05/2022 00:00  Attached To:  Hospital Encounter on 11/05/22  Source Information  Default, Provider, MD    MAU Course: Orders Placed This Encounter  Procedures   Wet prep, genital   [place] MFM OB LIMITED   Fetal fibronectin   Discharge patient   Meds ordered this encounter  Medications   fluconazole (DIFLUCAN) 150 MG tablet    Sig: Take 1 tablet (150 mg total) by mouth daily.    Dispense:  1 tablet    Refill:  1    Order Specific Question:   Supervising Provider    Answer:   Donnamae Jude [[id number]]   Pt fairly vocal on arrival to MAU, accompanied by [facility] officer. Came to room with charge RN to assess pt. Collected FFN, wet prep and GC/CT prior to cervical check. Pt found to be closed/thick/high. Much reassurance given.   Results showed yeast infection which explains a lot of the vaginal pressure. Ultrasound entirely normal. Pt stable for discharge with paper prescription she can turn in if she ends up spending any time incarcerated. Encouraged to establish routine OB care with [facility] when able.  MDM: Low  Assessment: 1. Abdominal cramping affecting pregnancy   2. Pelvic pain affecting pregnancy in second trimester, antepartum   3. Vaginal yeast infection   4. NST (non-stress test) reactive   5. [redacted] weeks gestation of pregnancy    Plan: Discharged to  [employer] custody in stable condition with return precautions.     Follow-up [place] for [facility]. Call.   Specialty: Obstetrics and Gynecology Why: to establish routine OB care for this pregnancy Contact information: [place] [phone]                Allergies as of 11/05/2022   No Known Allergies  Medication List     STOP taking these medications    doxycycline 100 MG capsule Commonly known as: VIBRAMYCIN       TAKE these medications    fluconazole 150 MG tablet Commonly known as: Diflucan Take 1 tablet (150 mg total) by mouth daily.       Edd Arbour, CNM, MSN, IBCLC Certified Nurse Midwife, [facility]

## 2022-11-05 NOTE — MAU Note (Signed)
.  Carolyn Cox is a 37 y.o. at 110w0d here in MAU reporting: escorted by police officer-pt is currently in custody. Pt stated that she had an episode of nasuea and vomiting today before she went to court. Vaginal pressure and cramping began after around noon. Pt is report vaginal pressure, "feels like I need to push" and contractions that are intermittent. Denies SROM or vaginal bleeding or bloody show. Endorses + fetal movement.  Onset of complaint: 11/05/22  Pain score: 8 lower abdomen, vaginal pressure Vitals:   11/05/22 2015  BP: 122/65  Pulse: 77  Resp: 16  SpO2: 100%     FHT:135bpm Lab orders placed from triage:   UA

## 2022-11-06 LAB — GC/CHLAMYDIA PROBE AMP (~~LOC~~) NOT AT ARMC
Chlamydia: NEGATIVE
Comment: NEGATIVE
Comment: NORMAL
Neisseria Gonorrhea: NEGATIVE

## 2022-12-08 ENCOUNTER — Inpatient Hospital Stay
Admission: EM | Admit: 2022-12-08 | Discharge: 2022-12-08 | Discharge status: Against Medical Advice | Payer: No Typology Code available for payment source | Admitting: Obstetrics and Gynecology

## 2022-12-08 NOTE — OB Triage Note (Signed)
Pt arrived approx 1000 am via EMS. When I arrived to her room the pt was laying in bed complaining of abdominal pain. Pt obviously pregnant. Asked pt to change into gown and give me a urine sample. Pt refused until she was able to call her mom. I gave her the phone and the call button to call when she was ready. 15-20 minutes go by. I walk into the room and the pt is gone. There is blood on the floor from her IV removal. IV saline lock found in the covers on the bed. Per EMS report, cop had arrived at the home to serve a warrant and her water broke. Security notified. Dineen Kid

## 2023-01-14 ENCOUNTER — Inpatient Hospital Stay
Admission: EM | Admit: 2023-01-14 | Discharge: 2023-01-15 | DRG: 832 | Payer: No Typology Code available for payment source | Attending: Obstetrics and Gynecology | Admitting: Obstetrics and Gynecology

## 2023-01-14 ENCOUNTER — Encounter: Payer: Self-pay | Admitting: Obstetrics and Gynecology

## 2023-01-14 ENCOUNTER — Observation Stay
Admission: EM | Admit: 2023-01-14 | Discharge: 2023-01-14 | Disposition: A | Discharge status: Home / Self Care | Payer: No Typology Code available for payment source | Attending: Obstetrics and Gynecology | Admitting: Obstetrics and Gynecology

## 2023-01-14 ENCOUNTER — Encounter: Payer: Self-pay | Admitting: Family

## 2023-01-14 ENCOUNTER — Other Ambulatory Visit: Payer: Self-pay

## 2023-01-14 DIAGNOSIS — O26893 Other specified pregnancy related conditions, third trimester: Secondary | ICD-10-CM | POA: Insufficient documentation

## 2023-01-14 DIAGNOSIS — Z3A33 33 weeks gestation of pregnancy: Secondary | ICD-10-CM | POA: Insufficient documentation

## 2023-01-14 DIAGNOSIS — O0933 Supervision of pregnancy with insufficient antenatal care, third trimester: Secondary | ICD-10-CM

## 2023-01-14 DIAGNOSIS — O26899 Other specified pregnancy related conditions, unspecified trimester: Secondary | ICD-10-CM | POA: Diagnosis present

## 2023-01-14 DIAGNOSIS — O99333 Smoking (tobacco) complicating pregnancy, third trimester: Secondary | ICD-10-CM | POA: Diagnosis present

## 2023-01-14 DIAGNOSIS — Z560 Unemployment, unspecified: Secondary | ICD-10-CM | POA: Diagnosis not present

## 2023-01-14 DIAGNOSIS — F17210 Nicotine dependence, cigarettes, uncomplicated: Secondary | ICD-10-CM | POA: Insufficient documentation

## 2023-01-14 DIAGNOSIS — I5021 Acute systolic (congestive) heart failure: Secondary | ICD-10-CM | POA: Insufficient documentation

## 2023-01-14 DIAGNOSIS — O99413 Diseases of the circulatory system complicating pregnancy, third trimester: Secondary | ICD-10-CM | POA: Insufficient documentation

## 2023-01-14 DIAGNOSIS — F1490 Cocaine use, unspecified, uncomplicated: Secondary | ICD-10-CM

## 2023-01-14 DIAGNOSIS — Z8249 Family history of ischemic heart disease and other diseases of the circulatory system: Secondary | ICD-10-CM

## 2023-01-14 DIAGNOSIS — F1290 Cannabis use, unspecified, uncomplicated: Secondary | ICD-10-CM | POA: Diagnosis present

## 2023-01-14 DIAGNOSIS — R109 Unspecified abdominal pain: Secondary | ICD-10-CM | POA: Diagnosis not present

## 2023-01-14 DIAGNOSIS — O99323 Drug use complicating pregnancy, third trimester: Secondary | ICD-10-CM

## 2023-01-14 LAB — COMPREHENSIVE METABOLIC PANEL
ALT: 13 U/L (ref 0–44)
AST: 28 U/L (ref 15–41)
Albumin: 2.7 g/dL — ABNORMAL LOW (ref 3.5–5.0)
Alkaline Phosphatase: 161 U/L — ABNORMAL HIGH (ref 38–126)
Anion gap: 8 (ref 5–15)
BUN: 6 mg/dL (ref 6–20)
CO2: 21 mmol/L — ABNORMAL LOW (ref 22–32)
Calcium: 8.5 mg/dL — ABNORMAL LOW (ref 8.9–10.3)
Chloride: 106 mmol/L (ref 98–111)
Creatinine, Ser: 0.49 mg/dL (ref 0.44–1.00)
GFR, Estimated: >60 mL/min (ref >60)
Glucose, Bld: 113 mg/dL — ABNORMAL HIGH (ref 70–99)
Potassium: 3.8 mmol/L (ref 3.5–5.1)
Sodium: 135 mmol/L (ref 135–145)
Total Bilirubin: 0.4 mg/dL (ref 0.3–1.2)
Total Protein: 6.7 g/dL (ref 6.5–8.1)

## 2023-01-14 LAB — CHLAMYDIA/NGC RT PCR (ARMC ONLY)
Chlamydia Tr: NOT DETECTED
N gonorrhoeae: NOT DETECTED

## 2023-01-14 LAB — URINALYSIS, ROUTINE W REFLEX MICROSCOPIC
Bilirubin Urine: NEGATIVE
Glucose, UA: NEGATIVE mg/dL
Hgb urine dipstick: NEGATIVE
Ketones, ur: NEGATIVE mg/dL
Leukocytes,Ua: NEGATIVE
Nitrite: NEGATIVE
Protein, ur: >=300 mg/dL — AB
Specific Gravity, Urine: 1.013 (ref 1.005–1.030)
pH: 8 (ref 5.0–8.0)

## 2023-01-14 LAB — CBC
HCT: 32.3 % — ABNORMAL LOW (ref 36.0–46.0)
Hemoglobin: 10.6 g/dL — ABNORMAL LOW (ref 12.0–15.0)
MCH: 28.3 pg (ref 26.0–34.0)
MCHC: 32.8 g/dL (ref 30.0–36.0)
MCV: 86.4 fL (ref 80.0–100.0)
Platelets: 249 10*3/uL (ref 150–400)
RBC: 3.74 MIL/uL — ABNORMAL LOW (ref 3.87–5.11)
RDW: 13.6 % (ref 11.5–15.5)
WBC: 12.5 10*3/uL — ABNORMAL HIGH (ref 4.0–10.5)
nRBC: 0 % (ref 0.0–0.2)

## 2023-01-14 LAB — URINE DRUG SCREEN, QUALITATIVE (ARMC ONLY)
Amphetamines, Ur Screen: NOT DETECTED
Barbiturates, Ur Screen: NOT DETECTED
Benzodiazepine, Ur Scrn: NOT DETECTED
Cannabinoid 50 Ng, Ur ~~LOC~~: POSITIVE — AB
Cocaine Metabolite,Ur ~~LOC~~: POSITIVE — AB
MDMA (Ecstasy)Ur Screen: NOT DETECTED
Methadone Scn, Ur: NOT DETECTED
Opiate, Ur Screen: NOT DETECTED
Phencyclidine (PCP) Ur S: NOT DETECTED
Tricyclic, Ur Screen: NOT DETECTED

## 2023-01-14 LAB — PROTEIN / CREATININE RATIO, URINE
Creatinine, Urine: 48 mg/dL
Protein Creatinine Ratio: 0.19 mg/mg{Cre} — ABNORMAL HIGH (ref 0.00–0.15)
Total Protein, Urine: 9 mg/dL

## 2023-01-14 LAB — WET PREP, GENITAL
Clue Cells Wet Prep HPF POC: NONE SEEN
Sperm: NONE SEEN
Trich, Wet Prep: NONE SEEN
WBC, Wet Prep HPF POC: <10 (ref <10)
Yeast Wet Prep HPF POC: NONE SEEN

## 2023-01-14 LAB — ROM PLUS (ARMC ONLY): Rom Plus: NEGATIVE

## 2023-01-14 LAB — TYPE AND SCREEN
ABO/RH(D): O POS
Antibody Screen: NEGATIVE

## 2023-01-14 LAB — GROUP B STREP BY PCR: Group B strep by PCR: NEGATIVE

## 2023-01-14 MED ORDER — LACTATED RINGERS IV SOLN: 500.0000 mL | INTRAVENOUS | Status: DC | PRN

## 2023-01-14 MED ORDER — OXYTOCIN 10 UNIT/ML IJ SOLN
INTRAMUSCULAR | Status: AC
  Filled 2023-01-14: qty 2

## 2023-01-14 MED ORDER — FENTANYL CITRATE (PF) 100 MCG/2ML IJ SOLN
100.0000 ug | INTRAMUSCULAR | Status: DC | PRN
  Administered 2023-01-14: 100 ug via INTRAVENOUS
  Filled 2023-01-14: qty 2

## 2023-01-14 MED ORDER — ACETAMINOPHEN 500 MG PO TABS: 1000.0000 mg | ORAL_TABLET | Freq: Four times a day (QID) | ORAL | Status: DC | PRN

## 2023-01-14 MED ORDER — OXYTOCIN BOLUS FROM INFUSION: 333.0000 mL | Freq: Once | INTRAVENOUS | Status: DC

## 2023-01-14 MED ORDER — LIDOCAINE HCL (PF) 1 % IJ SOLN: 30.0000 mL | INTRAMUSCULAR | Status: DC | PRN

## 2023-01-14 MED ORDER — OXYTOCIN-SODIUM CHLORIDE 30-0.9 UT/500ML-% IV SOLN: 2.5000 [IU]/h | INTRAVENOUS | Status: DC

## 2023-01-14 MED ORDER — ONDANSETRON HCL 4 MG/2ML IJ SOLN: 4.0000 mg | Freq: Four times a day (QID) | INTRAMUSCULAR | Status: DC | PRN

## 2023-01-14 MED ORDER — SOD CITRATE-CITRIC ACID 500-334 MG/5ML PO SOLN: 30.0000 mL | ORAL | Status: DC | PRN

## 2023-01-14 MED ORDER — AMMONIA AROMATIC IN INHA
RESPIRATORY_TRACT | Status: AC
  Filled 2023-01-14: qty 10

## 2023-01-14 MED ORDER — LIDOCAINE HCL (PF) 1 % IJ SOLN
INTRAMUSCULAR | Status: AC
  Filled 2023-01-14: qty 30

## 2023-01-14 MED ORDER — ACETAMINOPHEN 325 MG PO TABS: 650.0000 mg | ORAL_TABLET | ORAL | Status: DC | PRN

## 2023-01-14 MED ORDER — ACETAMINOPHEN 500 MG PO TABS
ORAL_TABLET | ORAL | Status: AC
  Administered 2023-01-14: 1000 mg via ORAL
  Filled 2023-01-14: qty 2

## 2023-01-14 MED ORDER — MISOPROSTOL 200 MCG PO TABS
ORAL_TABLET | ORAL | Status: AC
  Filled 2023-01-14: qty 4

## 2023-01-14 MED ORDER — OXYTOCIN-SODIUM CHLORIDE 30-0.9 UT/500ML-% IV SOLN
INTRAVENOUS | Status: AC
  Filled 2023-01-14: qty 500

## 2023-01-14 MED ORDER — BETAMETHASONE SOD PHOS & ACET 6 (3-3) MG/ML IJ SUSP
12.0000 mg | INTRAMUSCULAR | Status: DC
  Administered 2023-01-14: 12 mg via INTRAMUSCULAR
  Filled 2023-01-14: qty 5

## 2023-01-14 MED ORDER — LACTATED RINGERS IV SOLN: INTRAVENOUS | Status: DC

## 2023-01-14 NOTE — OB Triage Note (Signed)
Pt discharged home per order.  Pt stable and ambulatory and an After Visit Summary was printed and given to the patient. Discharge education completed with patient including follow up instructions, appointments, and medication list. Pt received labor and bleeding precautions. Patient able to verbalize understanding, all questions fully answered upon discharge. Pt discharged in police custody to jail- pt aware of plan.
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
## 2023-01-14 NOTE — ED Triage Notes (Addendum)
Pt to room 26 via [facility] at [facility] from home with c/o ROM apx 1 hr ago with urge to push. L&D team in room with MD Lennette Bihari and CNM. Decision made to move pt to L&D suite at this time. Pt agreeable and transported out of dept via ED stretcher with L&D staff at this time.  Unable to complete remainder of triage assessment.
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
## 2023-01-14 NOTE — Discharge Instructions (Signed)
BELLY BAND- AMAZON, ETC TYLENOL AS NEEDED FOR PAIN PLENTY OF WATER CONTACT OB PROVIDER FOR FOLLOW-UP, PHYSICAL THERAPY REFERRAL, ETC.  STRETCHING AT NIGHT BEFORE BED
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
## 2023-01-15 DIAGNOSIS — R109 Unspecified abdominal pain: Secondary | ICD-10-CM

## 2023-01-15 DIAGNOSIS — Z3A33 33 weeks gestation of pregnancy: Secondary | ICD-10-CM

## 2023-01-15 DIAGNOSIS — O26893 Other specified pregnancy related conditions, third trimester: Secondary | ICD-10-CM

## 2023-01-15 LAB — HIV ANTIBODY (ROUTINE TESTING W REFLEX): HIV Screen 4th Generation wRfx: NONREACTIVE

## 2023-01-15 LAB — RPR: RPR Ser Ql: NONREACTIVE

## 2023-07-25 ENCOUNTER — Ambulatory Visit
Admission: EM | Admit: 2023-07-25 | Discharge: 2023-07-25 | Disposition: A | Discharge status: Home / Self Care | Payer: No Typology Code available for payment source | Attending: Family Medicine | Admitting: Family Medicine

## 2023-07-25 ENCOUNTER — Encounter: Payer: Self-pay | Admitting: Emergency Medicine

## 2023-07-25 DIAGNOSIS — N898 Other specified noninflammatory disorders of vagina: Secondary | ICD-10-CM | POA: Insufficient documentation

## 2023-07-25 NOTE — ED Provider Notes (Signed)
[facility]    CSN: [id number] Arrival date & time: 07/25/23  1255      History   Chief Complaint Chief Complaint  Patient presents with   Vaginal Discharge    HPI Carolyn Cox is a 37 y.o. female.    Vaginal Discharge  Patient is here for vaginal discharge, itching and odor for several days.  Her BF is here as well to be checked.  No known STD exposure otherwise.  No abd pain, no urinary symptoms.  No fevers/chills.        Past Medical History:  Diagnosis Date   Acute systolic heart failure (HCC) 09/2017   Chronic hepatitis C (HCC)    Nonischemic cardiomyopathy (HCC) 09/2017   EF 25-30% EF   Polysubstance abuse [facility])    Sciatica     Patient Active Problem List   Diagnosis Date Noted   Abdominal pain affecting pregnancy, antepartum 01/14/2023   [redacted] weeks gestation of pregnancy 01/14/2023   Trichomonas infection 10/17/2018   Acute systolic heart failure (HCC)    Amphetamine abuse (HCC) 09/19/2017   Cocaine abuse (HCC) 09/19/2017   Substance-induced anxiety disorder (HCC) 09/19/2017   Elevated troponin 09/18/2017    Past Surgical History:  Procedure Laterality Date   LEFT HEART CATH AND CORONARY ANGIOGRAPHY N/A 09/20/2017   Procedure: LEFT HEART CATH AND CORONARY ANGIOGRAPHY;  Surgeon: Iran Ouch, MD;  Location: [facility] INVASIVE CV LAB;  Service: Cardiovascular;  Laterality: N/A;   MANDIBLE FRACTURE SURGERY      OB History     Gravida  6   Para  3   Term  3   Preterm  0   AB  2   Living  2      SAB  0   IAB  0   Ectopic  0   Multiple      Live Births  2            Home Medications    Prior to Admission medications   Not on File    Family History Family History  Problem Relation Age of Onset   Hypertension Mother    Hypertension Maternal Grandmother    Kidney disease Maternal Grandmother    Kidney disease Maternal Aunt     Social History Social History   Tobacco Use   Smoking status: Every  Day    Types: Cigarettes   Smokeless tobacco: Never   Tobacco comments:    less than 1 pack/wk.  Vaping Use   Vaping status: Never Used  Substance Use Topics   Alcohol use: Not Currently   Drug use: Not Currently    Types: Cocaine, Marijuana    Comment: Last use     Allergies   Patient has no known allergies.   Review of Systems Review of Systems  Constitutional: Negative.   HENT: Negative.    Respiratory: Negative.    Cardiovascular: Negative.   Gastrointestinal: Negative.   Genitourinary:  Positive for vaginal discharge.  Psychiatric/Behavioral: Negative.       Physical Exam Triage Vital Signs ED Triage Vitals  Encounter Vitals Group     BP 07/25/23 1337 117/77     Systolic BP Percentile --      Diastolic BP Percentile --      Pulse Rate 07/25/23 1337 69     Resp 07/25/23 1337 16     Temp 07/25/23 1337 98.7 F (37.1 C)     Temp Source 07/25/23 1337 Oral  SpO2 07/25/23 1337 100 %     Weight 07/25/23 1339 148 lb (67.1 kg)     Height 07/25/23 1339 5' 6.5" (1.689 m)     Head Circumference --      Peak Flow --      Pain Score 07/25/23 1339 0     Pain Loc --      Pain Education --      Exclude from Growth Chart --    No data found.  Updated Vital Signs BP 117/77 (BP Location: Left Arm)   Pulse 69   Temp 98.7 F (37.1 C) (Oral)   Resp 16   Ht 5' 6.5" (1.689 m)   Wt 67.1 kg   LMP 06/27/2023   SpO2 100%   Breastfeeding No   BMI 23.53 kg/m   Visual Acuity Right Eye Distance:   Left Eye Distance:   Bilateral Distance:    Right Eye Near:   Left Eye Near:    Bilateral Near:     Physical Exam Constitutional:      Appearance: Normal appearance.  Cardiovascular:     Rate and Rhythm: Normal rate and regular rhythm.  Pulmonary:     Effort: Pulmonary effort is normal.  Neurological:     General: No focal deficit present.     Mental Status: She is alert.  Psychiatric:        Mood and Affect: Mood normal.      UC Treatments / Results   Labs (all labs ordered are listed, but only abnormal results are displayed) Labs Reviewed  CERVICOVAGINAL ANCILLARY ONLY    EKG   Radiology No results found.  Procedures Procedures (including critical care time)  Medications Ordered in UC Medications - No data to display  Initial Impression / Assessment and Plan / UC Course  I have reviewed the triage vital signs and the nursing notes.  Pertinent labs & imaging results that were available during my care of the patient were reviewed by me and considered in my medical decision making (see chart for details).   Final Clinical Impressions(s) / UC Diagnoses   Final diagnoses:  Vaginal discharge     Discharge Instructions      You were seen today for STD screening.  This should be resulted by tomorrow and you will be called if anything is positive and treatment is necessary.      ED Prescriptions   None    PDMP not reviewed this encounter.   Jannifer Franklin, MD 07/25/23 1352

## 2023-07-25 NOTE — Discharge Instructions (Signed)
You were seen today for STD screening.  This should be resulted by tomorrow and you will be called if anything is positive and treatment is necessary.

## 2023-07-25 NOTE — ED Triage Notes (Signed)
Patient c/o white vaginal discharge w/odor, slight itching x 3 days.  Denies any OTC meds.

## 2023-07-26 LAB — CERVICOVAGINAL ANCILLARY ONLY
Bacterial Vaginitis (gardnerella): POSITIVE — AB
Candida Glabrata: NEGATIVE
Candida Vaginitis: NEGATIVE
Chlamydia: NEGATIVE
Comment: NEGATIVE
Comment: NEGATIVE
Comment: NEGATIVE
Comment: NEGATIVE
Comment: NEGATIVE
Comment: NORMAL
Neisseria Gonorrhea: NEGATIVE
Trichomonas: NEGATIVE

## 2023-07-29 ENCOUNTER — Telehealth: Payer: Self-pay | Admitting: Family Medicine

## 2023-07-29 MED ORDER — METRONIDAZOLE 500 MG PO TABS: 500.0000 mg | ORAL_TABLET | Freq: Two times a day (BID) | ORAL | 0 refills | Status: DC

## 2023-07-29 MED ORDER — METRONIDAZOLE 500 MG PO TABS: 500.0000 mg | ORAL_TABLET | Freq: Two times a day (BID) | ORAL | 0 refills | Status: AC

## 2023-07-29 NOTE — Telephone Encounter (Signed)
Patient called in regards to cervicovaginal swab done 10/17;  +BV.  Will treat with flagyl 500mg  bid x 7 days

## 2023-08-16 ENCOUNTER — Ambulatory Visit: Payer: Self-pay | Admitting: Nurse Practitioner

## 2023-08-20 ENCOUNTER — Ambulatory Visit
Admission: EM | Admit: 2023-08-20 | Discharge: 2023-08-20 | Disposition: A | Discharge status: Home / Self Care | Payer: No Typology Code available for payment source | Attending: Internal Medicine | Admitting: Internal Medicine

## 2023-08-20 DIAGNOSIS — Z113 Encounter for screening for infections with a predominantly sexual mode of transmission: Secondary | ICD-10-CM | POA: Diagnosis not present

## 2023-08-20 DIAGNOSIS — N898 Other specified noninflammatory disorders of vagina: Secondary | ICD-10-CM | POA: Diagnosis present

## 2023-08-20 MED ORDER — FLUCONAZOLE 150 MG PO TABS: 150.0000 mg | ORAL_TABLET | Freq: Every day | ORAL | 0 refills | Status: AC

## 2023-08-20 NOTE — ED Triage Notes (Signed)
Patient presents with discharge since last Friday. Patient states she is unsure if it is a yeast infection from antibiotics.

## 2023-08-20 NOTE — ED Provider Notes (Signed)
[facility]    CSN: [id number] Arrival date & time: 08/20/23  0846      History   Chief Complaint No chief complaint on file.   HPI Carolyn Cox is a 37 y.o. female.   Patient presents with a white vaginal discharge that started about 5 days ago.  Reports that she recently completed metronidazole from previous visit on 07/25/2023 where she tested positive for bacterial vaginosis.  She reports that she presented at that urgent care visit given her boyfriend tested positive for gonorrhea and trichomonas.  Although, patient reports that she only took metronidazole for bacterial vaginosis and was not treated for STDs.  Reports that her significant other was treated for STDs but they did not wait 7 days prior to having intercourse again.  She was having vaginal irritation at the urgent care visit but reports that she has a new white vaginal discharge and is concerned for yeast infection.  Last menstrual cycle was 07/28/2023.     Past Medical History:  Diagnosis Date   Acute systolic heart failure (HCC) 09/2017   Chronic hepatitis C (HCC)    Nonischemic cardiomyopathy (HCC) 09/2017   EF 25-30% EF   Polysubstance abuse [facility])    Sciatica     Patient Active Problem List   Diagnosis Date Noted   Abdominal pain affecting pregnancy, antepartum 01/14/2023   [redacted] weeks gestation of pregnancy 01/14/2023   Trichomonas infection 10/17/2018   Acute systolic heart failure (HCC)    Amphetamine abuse (HCC) 09/19/2017   Cocaine abuse (HCC) 09/19/2017   Substance-induced anxiety disorder (HCC) 09/19/2017   Elevated troponin 09/18/2017    Past Surgical History:  Procedure Laterality Date   LEFT HEART CATH AND CORONARY ANGIOGRAPHY N/A 09/20/2017   Procedure: LEFT HEART CATH AND CORONARY ANGIOGRAPHY;  Surgeon: Iran Ouch, MD;  Location: [facility] INVASIVE CV LAB;  Service: Cardiovascular;  Laterality: N/A;   MANDIBLE FRACTURE SURGERY      OB History     Gravida  6   Para   3   Term  3   Preterm  0   AB  2   Living  2      SAB  0   IAB  0   Ectopic  0   Multiple      Live Births  2            Home Medications    Prior to Admission medications   Medication Sig Start Date End Date Taking? Authorizing Provider  fluconazole (DIFLUCAN) 150 MG tablet Take 1 tablet (150 mg total) by mouth daily. 08/20/23  Yes Kaylen Nghiem, Rolly Salter E, FNP  metroNIDAZOLE (FLAGYL) 500 MG tablet Take 1 tablet (500 mg total) by mouth 2 (two) times daily. 07/29/23   Jannifer Franklin, MD    Family History Family History  Problem Relation Age of Onset   Hypertension Mother    Hypertension Maternal Grandmother    Kidney disease Maternal Grandmother    Kidney disease Maternal Aunt     Social History Social History   Tobacco Use   Smoking status: Every Day    Types: Cigarettes   Smokeless tobacco: Never   Tobacco comments:    less than 1 pack/wk.  Vaping Use   Vaping status: Never Used  Substance Use Topics   Alcohol use: Not Currently   Drug use: Not Currently    Types: Cocaine, Marijuana    Comment: Last use     Allergies   Patient has no  known allergies.   Review of Systems Review of Systems Per HPI  Physical Exam Triage Vital Signs ED Triage Vitals  Encounter Vitals Group     BP 08/20/23 0935 111/75     Systolic BP Percentile --      Diastolic BP Percentile --      Pulse Rate 08/20/23 0935 71     Resp 08/20/23 0935 16     Temp 08/20/23 0935 98.3 F (36.8 C)     Temp Source 08/20/23 0935 Oral     SpO2 08/20/23 0935 99 %     Weight 08/20/23 0933 155 lb (70.3 kg)     Height 08/20/23 0933 5' 6.5" (1.689 m)     Head Circumference --      Peak Flow --      Pain Score 08/20/23 0933 0     Pain Loc --      Pain Education --      Exclude from Growth Chart --    No data found.  Updated Vital Signs BP 111/75 (BP Location: Left Arm)   Pulse 71   Temp 98.3 F (36.8 C) (Oral)   Resp 16   Ht 5' 6.5" (1.689 m)   Wt 155 lb (70.3 kg)   LMP  07/28/2023 (Approximate)   SpO2 99%   BMI 24.64 kg/m   Visual Acuity Right Eye Distance:   Left Eye Distance:   Bilateral Distance:    Right Eye Near:   Left Eye Near:    Bilateral Near:     Physical Exam Constitutional:      General: She is not in acute distress.    Appearance: Normal appearance. She is not toxic-appearing or diaphoretic.  HENT:     Head: Normocephalic and atraumatic.  Eyes:     Extraocular Movements: Extraocular movements intact.     Conjunctiva/sclera: Conjunctivae normal.  Pulmonary:     Effort: Pulmonary effort is normal.  Genitourinary:    Comments: Deferred with shared decision making.  Self swab performed. Neurological:     General: No focal deficit present.     Mental Status: She is alert and oriented to person, place, and time. Mental status is at baseline.  Psychiatric:        Mood and Affect: Mood normal.        Behavior: Behavior normal.        Thought Content: Thought content normal.        Judgment: Judgment normal.      UC Treatments / Results  Labs (all labs ordered are listed, but only abnormal results are displayed) Labs Reviewed  CERVICOVAGINAL ANCILLARY ONLY    EKG   Radiology No results found.  Procedures Procedures (including critical care time)  Medications Ordered in UC Medications - No data to display  Initial Impression / Assessment and Plan / UC Course  I have reviewed the triage vital signs and the nursing notes.  Pertinent labs & imaging results that were available during my care of the patient were reviewed by me and considered in my medical decision making (see chart for details).     I do suspect the patient could have an antibiotic induced yeast infection so will opt to treat with Diflucan today while awaiting cervicovaginal swab results.  Discussed with patient empirically treating for gonorrhea exposure with IM Rocephin but patient wishes to await vaginal swab results prior to any treatment.  She was  already treated with metronidazole which should have covered for trichomonas exposure  as well.  Will send cervicovaginal swab to test for STDs, BV, yeast.  Patient declined blood work for HIV and syphilis.  Advised strict follow-up precautions and refraining from sexual activity until test results and treatment are complete.  Patient verbalized understanding and was agreeable with plan. Final Clinical Impressions(s) / UC Diagnoses   Final diagnoses:  Vaginal discharge  Screening examination for venereal disease     Discharge Instructions      I have sent Diflucan for yeast infection.  Vaginal swab is pending.  Will call if results are positive and send any appropriate treatment.    ED Prescriptions     Medication Sig Dispense Auth. Provider   fluconazole (DIFLUCAN) 150 MG tablet Take 1 tablet (150 mg total) by mouth daily. 1 tablet [place], [place]ie [place], [place]      PDMP not reviewed this encounter.   Gustavus Bryant, [place] 08/20/23 [phone]

## 2023-08-20 NOTE — Discharge Instructions (Signed)
I have sent Diflucan for yeast infection.  Vaginal swab is pending.  Will call if results are positive and send any appropriate treatment.

## 2023-08-21 ENCOUNTER — Telehealth: Payer: Self-pay

## 2023-08-21 LAB — CERVICOVAGINAL ANCILLARY ONLY
Bacterial Vaginitis (gardnerella): POSITIVE — AB
Candida Glabrata: NEGATIVE
Candida Vaginitis: NEGATIVE
Chlamydia: NEGATIVE
Comment: NEGATIVE
Comment: NEGATIVE
Comment: NEGATIVE
Comment: NEGATIVE
Comment: NEGATIVE
Comment: NORMAL
Neisseria Gonorrhea: NEGATIVE
Trichomonas: NEGATIVE

## 2023-08-21 MED ORDER — METRONIDAZOLE 500 MG PO TABS: 500.0000 mg | ORAL_TABLET | Freq: Two times a day (BID) | ORAL | 0 refills | Status: DC

## 2023-08-21 MED ORDER — METRONIDAZOLE 500 MG PO TABS: 500.0000 mg | ORAL_TABLET | Freq: Two times a day (BID) | ORAL | 0 refills | Status: AC

## 2023-08-21 NOTE — Telephone Encounter (Signed)
Per protocol, pt requires tx with metronidazole. Rx sent to pharmacy on file.

## 2024-06-06 ENCOUNTER — Other Ambulatory Visit: Payer: Self-pay | Admitting: Medical Genetics
